# Patient Record
Sex: Female | Born: 1965 | Race: Black or African American | Hispanic: No | Smoking: Never smoker
Health system: Southern US, Community
[De-identification: ages and names within clinical notes are randomized; demographics above are authoritative.]

## PROBLEM LIST (undated history)

## (undated) DIAGNOSIS — I10 Essential (primary) hypertension: Secondary | ICD-10-CM

## (undated) DIAGNOSIS — L03317 Cellulitis of buttock: Secondary | ICD-10-CM

## (undated) DIAGNOSIS — R42 Dizziness and giddiness: Secondary | ICD-10-CM

## (undated) DIAGNOSIS — D649 Anemia, unspecified: Secondary | ICD-10-CM

## (undated) DIAGNOSIS — J302 Other seasonal allergic rhinitis: Secondary | ICD-10-CM

## (undated) HISTORY — PX: ABCESS DRAINAGE: SHX399

## (undated) HISTORY — DX: Essential (primary) hypertension: I10

## (undated) HISTORY — DX: Cellulitis of buttock: L03.317

## (undated) HISTORY — DX: Other seasonal allergic rhinitis: J30.2

## (undated) HISTORY — PX: TUBAL LIGATION: SHX77

## (undated) HISTORY — DX: Anemia, unspecified: D64.9

## (undated) HISTORY — PX: CHOLECYSTECTOMY: SHX55

## (undated) HISTORY — DX: Dizziness and giddiness: R42

---

## 2001-04-15 ENCOUNTER — Observation Stay (HOSPITAL_COMMUNITY): Admission: RE | Admit: 2001-04-15 | Discharge: 2001-04-16 | Payer: Self-pay | Admitting: Surgery

## 2001-04-15 ENCOUNTER — Encounter (INDEPENDENT_AMBULATORY_CARE_PROVIDER_SITE_OTHER): Payer: Self-pay | Admitting: Specialist

## 2001-05-14 ENCOUNTER — Other Ambulatory Visit: Admission: RE | Admit: 2001-05-14 | Discharge: 2001-05-14 | Payer: Self-pay | Admitting: Obstetrics and Gynecology

## 2002-06-23 ENCOUNTER — Other Ambulatory Visit: Admission: RE | Admit: 2002-06-23 | Discharge: 2002-06-23 | Payer: Self-pay | Admitting: Obstetrics and Gynecology

## 2003-07-05 ENCOUNTER — Encounter: Admission: RE | Admit: 2003-07-05 | Discharge: 2003-07-05 | Payer: Self-pay | Admitting: Family Medicine

## 2003-07-06 ENCOUNTER — Other Ambulatory Visit: Admission: RE | Admit: 2003-07-06 | Discharge: 2003-07-06 | Payer: Self-pay | Admitting: Obstetrics and Gynecology

## 2004-07-08 ENCOUNTER — Other Ambulatory Visit: Admission: RE | Admit: 2004-07-08 | Discharge: 2004-07-08 | Payer: Self-pay | Admitting: Obstetrics and Gynecology

## 2005-07-08 ENCOUNTER — Other Ambulatory Visit: Admission: RE | Admit: 2005-07-08 | Discharge: 2005-07-08 | Payer: Self-pay | Admitting: Obstetrics and Gynecology

## 2010-01-03 ENCOUNTER — Ambulatory Visit (HOSPITAL_COMMUNITY): Admission: RE | Admit: 2010-01-03 | Discharge: 2010-01-03 | Payer: Self-pay | Admitting: Obstetrics and Gynecology

## 2010-05-29 LAB — PREGNANCY, URINE: Preg Test, Ur: NEGATIVE

## 2010-05-30 LAB — CBC
HCT: 40.3 % (ref 36.0–46.0)
MCHC: 33.8 g/dL (ref 30.0–36.0)
MCV: 89.9 fL (ref 78.0–100.0)
Platelets: 242 10*3/uL (ref 150–400)
RDW: 13 % (ref 11.5–15.5)

## 2010-08-02 NOTE — Op Note (Signed)
[facility]  Patient:    Michaela Wilson, Michaela Wilson Visit Number: [id number] MRN: [id number]          Service Type: SUR Location: [id number] Attending Physician:  Shelly Rubenstein Dictated by:   Abigail Miyamoto, M.D. Proc. Date: 04/15/01 Admit Date:  04/15/2001                             Operative Report  PREOPERATIVE DIAGNOSIS:  Symptomatic cholelithiasis.  POSTOPERATIVE DIAGNOSIS:  Symptomatic cholelithiasis.  PROCEDURE:  Laparoscopic cholecystectomy.  SURGEON:  Abigail Miyamoto, M.D.  ASSISTANT:  Sheppard Plumber. Earlene Plater, M.D.  ANESTHESIA:  General endotracheal anesthesia and 0.25% Marcaine plain.  ESTIMATED BLOOD LOSS:  Minimal.  FINDINGS:  The patient was found to have cholelithiasis and chronic changes consistent with chronic cholecystitis.  DESCRIPTION OF PROCEDURE:  The patient was brought to the operating room, identified as [name]. She was placed supine on the operating room table and general anesthesia was induced. Her abdomen was then prepped and draped in the usual sterile fashion. Using a #15 blade, a small transverse incision was made below the umbilicus. The incision was carried down to the fascia which was then opened with the scalpel. A hemostat was then used to pass through the peritoneal cavity. A #0 Vicryl pursestring suture was then placed around the fascial opening. The Hasson port was then placed through the opening and insufflation of the abdomen was begun. Next an 11 mm port was placed in the patients epigastrium and two 5 mm ports were placed in the patients right flank under direct vision. The gallbladder was then grasped and retracted above the liver bed. It was found to be chronically inflamed with multiple adhesions to it. These were taken down bluntly. The base of the gallbladder was then dissected out and the cystic stump and artery were each dissected out and placed proximally, once distally and transected with  scissors. A separate bridging vein also required several clips. The gallbladder was then dissected free from the liver bed with the electrocautery. During dissection the gallbladder was slightly entered. Once the gallbladder was completely excised from the liver bed, it was placed in an endosac and removed through the umbilicus. The #0 Vicryl at the umbilicus was then tied in place and closing the fascial defect. A separate figure-of-eight #0 Vicryl suture was placed at the umbilicus as well. The abdomen again was copiously irrigated with normal saline. Hemostasis appeared to be achieved. All ports were then removed under direct vision and the abdomen was deflated. All incisions were then anesthetized with 0.25% Marcaine and then closed with 4-0 monocryl subcuticular sutures. Steri-Strips, gauze and tape were then applied. The patient tolerated the procedure well. All sponge, needle and instrument counts were correct at the end of the procedure. The patient was then extubated in the operating room and taken stable condition to the recovery room. Dictated by:   Abigail Miyamoto, M.D. Attending Physician:  Shelly Rubenstein DD:  04/15/01 TD:  04/15/01 Job: [id number]

## 2014-02-10 ENCOUNTER — Ambulatory Visit (INDEPENDENT_AMBULATORY_CARE_PROVIDER_SITE_OTHER): Payer: BC Managed Care – PPO | Admitting: Family Medicine

## 2014-02-10 VITALS — BP 124/80 | HR 85 | Temp 98.5°F | Resp 18 | Ht 62.0 in | Wt 142.8 lb

## 2014-02-10 DIAGNOSIS — N898 Other specified noninflammatory disorders of vagina: Secondary | ICD-10-CM

## 2014-02-10 DIAGNOSIS — A499 Bacterial infection, unspecified: Secondary | ICD-10-CM

## 2014-02-10 DIAGNOSIS — B9689 Other specified bacterial agents as the cause of diseases classified elsewhere: Secondary | ICD-10-CM

## 2014-02-10 DIAGNOSIS — N760 Acute vaginitis: Secondary | ICD-10-CM

## 2014-02-10 LAB — POCT URINALYSIS DIPSTICK
BILIRUBIN UA: NEGATIVE
Glucose, UA: NEGATIVE
KETONES UA: NEGATIVE
Nitrite, UA: NEGATIVE
PH UA: 5
PROTEIN UA: NEGATIVE
SPEC GRAV UA: 1.025
Urobilinogen, UA: 0.2

## 2014-02-10 LAB — POCT WET PREP WITH KOH
KOH Prep POC: NEGATIVE
TRICHOMONAS UA: NEGATIVE
YEAST WET PREP PER HPF POC: NEGATIVE

## 2014-02-10 LAB — POCT UA - MICROSCOPIC ONLY
CASTS, UR, LPF, POC: NEGATIVE
Crystals, Ur, HPF, POC: NEGATIVE
MUCUS UA: NEGATIVE
YEAST UA: NEGATIVE

## 2014-02-10 MED ORDER — FLUCONAZOLE 150 MG PO TABS: 150.0000 mg | ORAL_TABLET | Freq: Once | ORAL | Status: DC

## 2014-02-10 MED ORDER — METRONIDAZOLE 500 MG PO TABS: 500.0000 mg | ORAL_TABLET | Freq: Two times a day (BID) | ORAL | Status: DC

## 2014-02-10 NOTE — Progress Notes (Signed)
Subjective:  This chart was scribed for Michaela Cheadle, MD by Michaela Wilson, ED Scribe at [facility].The patient was seen in exam room 09 and the patient's care was started at 10:09 AM.   Patient ID: Michaela Wilson, female    DOB: 04-May-1965, 48 y.o.   MRN: [id number] Chief Complaint  Patient presents with  . Vaginitis    Pt. states her vaginal area is swollen and itchy. Pt. used Monistat on Monday with no relief.    HPI  HPI Comments: Michaela Wilson is a 48 y.o. female who presents to [facility] complaining of vaginitis which began 4 days ago. She has, itching and discomfort and a thick, white vaginal discharge as associated symptoms. Pt has tried monistat which has provided little relief. She denies fever, chills, nausea, vomiting, diarrhea, constipation, abdominal pain, and back pain.  Pt oes not recall her last PAP smear, but does believe it has been in the past 5 years. She Has not had an abnormal PAP smear.  Pt has had a tubal ligation.  She does not have a PCP.   History reviewed. No pertinent past medical history. No current outpatient prescriptions on file prior to visit.   No current facility-administered medications on file prior to visit.  No Known Allergies  Review of Systems  Constitutional: Negative for fever and chills.  Gastrointestinal: Negative for nausea, vomiting, abdominal pain, diarrhea and constipation.  Genitourinary: Positive for vaginal discharge and vaginal pain.  Musculoskeletal: Negative for back pain.      Objective:  BP 124/80 mmHg  Pulse 85  Temp(Src) 98.5 F (36.9 C) (Oral)  Resp 18  Ht 5\' 2"  (1.575 m)  Wt 142 lb 12.8 oz (64.774 kg)  BMI 26.11 kg/m2  SpO2 100%  LMP  Physical Exam  Constitutional: She is oriented to person, place, and time. She appears well-developed and well-nourished. No distress.  HENT:  Head: Normocephalic and atraumatic.  Eyes: EOM are normal.  Neck: Normal range of motion.  Cardiovascular: Normal rate.    Pulmonary/Chest: Effort normal.  Genitourinary:  Copious amount of yellow, thick discharge of the labia minora.  Copious amount of thin yellow discharge in vaginal vault Cervix with nabothian cysts very friable  No cervical motion tenderness but uterus is TTP. Uterus is not fixed or enlarged. Adnexais normal bilaterally.  Neurological: She is alert and oriented to person, place, and time.  Skin: Skin is warm and dry.  Psychiatric: She has a normal mood and affect. Her behavior is normal.  Nursing note and vitals reviewed.  Results for orders placed or performed in visit on 02/10/14  POCT Wet Prep with KOH  Result Value Ref Range   Trichomonas, UA Negative    Clue Cells Wet Prep HPF POC 100%    Epithelial Wet Prep HPF POC 5-7    Yeast Wet Prep HPF POC neg    Bacteria Wet Prep HPF POC 3+    RBC Wet Prep HPF POC 10-15    WBC Wet Prep HPF POC 3-5    KOH Prep POC Negative   POCT urinalysis dipstick  Result Value Ref Range   Color, UA yellow    Clarity, UA cloudy    Glucose, UA neg    Bilirubin, UA neg    Ketones, UA neg    Spec Grav, UA 1.025    Blood, UA small    pH, UA 5.0    Protein, UA neg    Urobilinogen, UA 0.2  Nitrite, UA neg    Leukocytes, UA large (3+)   POCT UA - Microscopic Only  Result Value Ref Range   WBC, Ur, HPF, POC 3-5    RBC, urine, microscopic 5-7    Bacteria, U Microscopic 1+    Mucus, UA neg    Epithelial cells, urine per micros 0-1    Crystals, Ur, HPF, POC neg    Casts, Ur, LPF, POC neg    Yeast, UA neg       Assessment & Plan:   Vaginal discharge - Plan: Pap IG, CT/NG NAA, and HPV (high risk), POCT Wet Prep with KOH, POCT urinalysis dipstick, POCT UA - Microscopic Only  Bacterial vaginosis  Meds ordered this encounter  Medications  . metroNIDAZOLE (FLAGYL) 500 MG tablet    Sig: Take 1 tablet (500 mg total) by mouth 2 (two) times daily.    Dispense:  14 tablet    Refill:  0  . fluconazole (DIFLUCAN) 150 MG tablet    Sig: Take 1  tablet (150 mg total) by mouth once.    Dispense:  1 tablet    Refill:  1    I personally performed the services described in this documentation, which was scribed in my presence. The recorded information has been reviewed and considered, and addended by me as needed.  Michaela Cheadle, MD MPH

## 2014-02-10 NOTE — Patient Instructions (Signed)
Bacterial Vaginosis Bacterial vaginosis is a vaginal infection that occurs when the normal balance of bacteria in the vagina is disrupted. It results from an overgrowth of certain bacteria. This is the most common vaginal infection in women of childbearing age. Treatment is important to prevent complications, especially in pregnant women, as it can cause a premature delivery. CAUSES  Bacterial vaginosis is caused by an increase in harmful bacteria that are normally present in smaller amounts in the vagina. Several different kinds of bacteria can cause bacterial vaginosis. However, the reason that the condition develops is not fully understood. RISK FACTORS Certain activities or behaviors can put you at an increased risk of developing bacterial vaginosis, including:  Having a new sex partner or multiple sex partners.  Douching.  Using an intrauterine device (IUD) for contraception. Women do not get bacterial vaginosis from toilet seats, bedding, swimming pools, or contact with objects around them. SIGNS AND SYMPTOMS  Some women with bacterial vaginosis have no signs or symptoms. Common symptoms include:  Grey vaginal discharge.  A fishlike odor with discharge, especially after sexual intercourse.  Itching or burning of the vagina and vulva.  Burning or pain with urination. DIAGNOSIS  Your health care provider will take a medical history and examine the vagina for signs of bacterial vaginosis. A sample of vaginal fluid may be taken. Your health care provider will look at this sample under a microscope to check for bacteria and abnormal cells. A vaginal pH test may also be done.  TREATMENT  Bacterial vaginosis may be treated with antibiotic medicines. These may be given in the form of a pill or a vaginal cream. A second round of antibiotics may be prescribed if the condition comes back after treatment.  HOME CARE INSTRUCTIONS   Only take over-the-counter or prescription medicines as  directed by your health care provider.  If antibiotic medicine was prescribed, take it as directed. Make sure you finish it even if you start to feel better.  Do not have sex until treatment is completed.  Tell all sexual partners that you have a vaginal infection. They should see their health care provider and be treated if they have problems, such as a mild rash or itching.  Practice safe sex by using condoms and only having one sex partner. SEEK MEDICAL CARE IF:   Your symptoms are not improving after 3 days of treatment.  You have increased discharge or pain.  You have a fever. MAKE SURE YOU:   Understand these instructions.  Will watch your condition.  Will get help right away if you are not doing well or get worse. FOR MORE INFORMATION  Centers for Disease Control and Prevention, Division of STD Prevention: www.cdc.gov/std American Sexual Health Association (ASHA): www.ashastd.org  Document Released: 03/03/2005 Document Revised: 12/22/2012 Document Reviewed: 10/13/2012 ExitCare Patient Information 2015 [employer]. This information is not intended to replace advice given to you by your health care provider. Make sure you discuss any questions you have with your health care provider.  

## 2014-02-15 LAB — PAP IG, CT-NG NAA, HPV HIGH-RISK
Chlamydia Probe Amp: NEGATIVE
GC Probe Amp: NEGATIVE
HPV DNA HIGH RISK: NOT DETECTED

## 2014-02-16 NOTE — Progress Notes (Signed)
Left message for patient to call back to schedule a physical with Dr Brigitte Pulse.

## 2014-02-20 NOTE — Progress Notes (Signed)
LMVM to CB to schedule CPE with Dr. Brigitte Pulse.

## 2014-04-11 ENCOUNTER — Encounter: Payer: Self-pay | Admitting: Family Medicine

## 2015-03-07 ENCOUNTER — Encounter (HOSPITAL_COMMUNITY): Payer: Self-pay | Admitting: *Deleted

## 2015-03-07 ENCOUNTER — Inpatient Hospital Stay (HOSPITAL_COMMUNITY)
Admission: EM | Admit: 2015-03-07 | Discharge: 2015-03-10 | DRG: 872 | Disposition: A | Discharge status: Home Health | Payer: 59 | Attending: Internal Medicine | Admitting: Internal Medicine

## 2015-03-07 DIAGNOSIS — A419 Sepsis, unspecified organism: Secondary | ICD-10-CM | POA: Diagnosis not present

## 2015-03-07 DIAGNOSIS — L0231 Cutaneous abscess of buttock: Secondary | ICD-10-CM | POA: Diagnosis present

## 2015-03-07 DIAGNOSIS — L03317 Cellulitis of buttock: Secondary | ICD-10-CM | POA: Diagnosis not present

## 2015-03-07 DIAGNOSIS — R509 Fever, unspecified: Secondary | ICD-10-CM | POA: Diagnosis present

## 2015-03-07 DIAGNOSIS — Z8249 Family history of ischemic heart disease and other diseases of the circulatory system: Secondary | ICD-10-CM

## 2015-03-07 DIAGNOSIS — D72829 Elevated white blood cell count, unspecified: Secondary | ICD-10-CM | POA: Diagnosis not present

## 2015-03-07 HISTORY — DX: Cellulitis of buttock: L03.317

## 2015-03-07 LAB — COMPREHENSIVE METABOLIC PANEL
ALT: 37 U/L (ref 14–54)
ANION GAP: 9 (ref 5–15)
AST: 33 U/L (ref 15–41)
Albumin: 3.4 g/dL — ABNORMAL LOW (ref 3.5–5.0)
Alkaline Phosphatase: 80 U/L (ref 38–126)
BUN: 8 mg/dL (ref 6–20)
CALCIUM: 8.8 mg/dL — AB (ref 8.9–10.3)
CO2: 27 mmol/L (ref 22–32)
Chloride: 100 mmol/L — ABNORMAL LOW (ref 101–111)
Creatinine, Ser: 0.91 mg/dL (ref 0.44–1.00)
GLUCOSE: 111 mg/dL — AB (ref 65–99)
POTASSIUM: 3.9 mmol/L (ref 3.5–5.1)
SODIUM: 136 mmol/L (ref 135–145)
TOTAL PROTEIN: 7.6 g/dL (ref 6.5–8.1)
Total Bilirubin: 1.3 mg/dL — ABNORMAL HIGH (ref 0.3–1.2)

## 2015-03-07 LAB — CBC WITH DIFFERENTIAL/PLATELET
BASOS ABS: 0 10*3/uL (ref 0.0–0.1)
BASOS PCT: 0 %
Eosinophils Absolute: 0 10*3/uL (ref 0.0–0.7)
Eosinophils Relative: 0 %
HEMATOCRIT: 36.5 % (ref 36.0–46.0)
HEMOGLOBIN: 11.5 g/dL — AB (ref 12.0–15.0)
LYMPHS PCT: 12 %
Lymphs Abs: 1.7 10*3/uL (ref 0.7–4.0)
MCH: 27.8 pg (ref 26.0–34.0)
MCHC: 31.5 g/dL (ref 30.0–36.0)
MCV: 88.4 fL (ref 78.0–100.0)
MONO ABS: 1.7 10*3/uL — AB (ref 0.1–1.0)
Monocytes Relative: 12 %
NEUTROS ABS: 10.9 10*3/uL — AB (ref 1.7–7.7)
NEUTROS PCT: 76 %
Platelets: 237 10*3/uL (ref 150–400)
RBC: 4.13 MIL/uL (ref 3.87–5.11)
RDW: 12.6 % (ref 11.5–15.5)
WBC: 14.3 10*3/uL — AB (ref 4.0–10.5)

## 2015-03-07 LAB — PROCALCITONIN: Procalcitonin: <0.1 ng/mL

## 2015-03-07 LAB — LACTIC ACID, PLASMA
LACTIC ACID, VENOUS: 1.4 mmol/L (ref 0.5–2.0)
Lactic Acid, Venous: 1 mmol/L (ref 0.5–2.0)

## 2015-03-07 LAB — I-STAT CG4 LACTIC ACID, ED: Lactic Acid, Venous: 1.05 mmol/L (ref 0.5–2.0)

## 2015-03-07 MED ORDER — VANCOMYCIN HCL IN DEXTROSE 750-5 MG/150ML-% IV SOLN
750.0000 mg | Freq: Two times a day (BID) | INTRAVENOUS | Status: DC
  Administered 2015-03-08 – 2015-03-10 (×5): 750 mg via INTRAVENOUS
  Filled 2015-03-07 (×6): qty 150

## 2015-03-07 MED ORDER — SODIUM CHLORIDE 0.9 % IV BOLUS (SEPSIS)
1000.0000 mL | Freq: Once | INTRAVENOUS | Status: AC
  Administered 2015-03-07: 1000 mL via INTRAVENOUS

## 2015-03-07 MED ORDER — ACETAMINOPHEN 650 MG RE SUPP: 650.0000 mg | Freq: Four times a day (QID) | RECTAL | Status: DC | PRN

## 2015-03-07 MED ORDER — HYDROCODONE-ACETAMINOPHEN 5-325 MG PO TABS
1.0000 | ORAL_TABLET | ORAL | Status: DC | PRN
  Administered 2015-03-07 – 2015-03-08 (×2): 1 via ORAL
  Administered 2015-03-08: 2 via ORAL
  Administered 2015-03-09: 1 via ORAL
  Filled 2015-03-07 (×2): qty 1
  Filled 2015-03-07: qty 2
  Filled 2015-03-07: qty 1

## 2015-03-07 MED ORDER — VANCOMYCIN HCL IN DEXTROSE 1-5 GM/200ML-% IV SOLN
1000.0000 mg | Freq: Once | INTRAVENOUS | Status: AC
  Administered 2015-03-07: 1000 mg via INTRAVENOUS
  Filled 2015-03-07: qty 200

## 2015-03-07 MED ORDER — SODIUM CHLORIDE 0.9 % IJ SOLN
3.0000 mL | Freq: Two times a day (BID) | INTRAMUSCULAR | Status: DC
  Administered 2015-03-09: 3 mL via INTRAVENOUS

## 2015-03-07 MED ORDER — SODIUM CHLORIDE 0.9 % IV SOLN: INTRAVENOUS | Status: DC

## 2015-03-07 MED ORDER — SODIUM CHLORIDE 0.9 % IV BOLUS (SEPSIS): 1000.0000 mL | Freq: Once | INTRAVENOUS | Status: DC

## 2015-03-07 MED ORDER — SODIUM CHLORIDE 0.9 % IV SOLN
INTRAVENOUS | Status: AC
  Administered 2015-03-07: 15:00:00 via INTRAVENOUS

## 2015-03-07 MED ORDER — ACETAMINOPHEN 325 MG PO TABS: 650.0000 mg | ORAL_TABLET | Freq: Four times a day (QID) | ORAL | Status: DC | PRN

## 2015-03-07 MED ORDER — ACETAMINOPHEN 500 MG PO TABS
1000.0000 mg | ORAL_TABLET | Freq: Once | ORAL | Status: AC
  Administered 2015-03-07: 1000 mg via ORAL
  Filled 2015-03-07: qty 2

## 2015-03-07 MED ORDER — PIPERACILLIN-TAZOBACTAM 3.375 G IVPB
3.3750 g | Freq: Three times a day (TID) | INTRAVENOUS | Status: DC
  Administered 2015-03-07 – 2015-03-10 (×8): 3.375 g via INTRAVENOUS
  Filled 2015-03-07 (×5): qty 50

## 2015-03-07 MED ORDER — ONDANSETRON HCL 4 MG PO TABS: 4.0000 mg | ORAL_TABLET | Freq: Four times a day (QID) | ORAL | Status: DC | PRN

## 2015-03-07 MED ORDER — DEXTROSE 5 % IV SOLN
1.0000 g | Freq: Once | INTRAVENOUS | Status: AC
  Administered 2015-03-07: 1 g via INTRAVENOUS
  Filled 2015-03-07: qty 10

## 2015-03-07 MED ORDER — ONDANSETRON HCL 4 MG/2ML IJ SOLN: 4.0000 mg | Freq: Four times a day (QID) | INTRAMUSCULAR | Status: DC | PRN

## 2015-03-07 MED ORDER — ONDANSETRON HCL 4 MG/2ML IJ SOLN: 4.0000 mg | Freq: Three times a day (TID) | INTRAMUSCULAR | Status: DC | PRN

## 2015-03-07 MED ORDER — LIDOCAINE-EPINEPHRINE 2 %-1:100000 IJ SOLN
20.0000 mL | Freq: Once | INTRAMUSCULAR | Status: AC
  Administered 2015-03-07: 20 mL via INTRADERMAL
  Filled 2015-03-07: qty 1

## 2015-03-07 MED ORDER — PIPERACILLIN-TAZOBACTAM 3.375 G IVPB 30 MIN
3.3750 g | Freq: Once | INTRAVENOUS | Status: AC
  Administered 2015-03-07: 3.375 g via INTRAVENOUS
  Filled 2015-03-07 (×2): qty 50

## 2015-03-07 NOTE — ED Notes (Addendum)
Pt reports she got a cyst on left buttocks starting Sunday 12/18, pt has quarter size abscess area, but hand size redness and swelling around. Hot to touch. Pt reports dizziness started yesterday with dry mouth. Pain 1/10. Pt reports she has a high pain tolerance, pt came into ED due to dizziness.   Wound started draining yesterday.

## 2015-03-07 NOTE — Progress Notes (Addendum)
Pt given a list of [facility] network internal medicine within zip code [phone] Pt appreciative of services provided \ Entered in d/c instructions Please use the list of in network providers for [facility] Schedule an appointment as soon as possible for a visit on 03/12/2015 given to you by the [facility] ED case manager to assist with finding a doctor for follow up care

## 2015-03-07 NOTE — H&P (Addendum)
[facility] History and Physical  Michaela Wilson QQP:[id number] DOB: [birth year] DOA: 03/07/2015  Referring physician: ER physician: Dr. Noemi Chapel  PCP: Dr. Delman Cheadle  Chief Complaint: right buttock abscess  HPI:  49 year old female with no significant past medical history who presented to [facility] ED with worsening pain, swelling and erythema on the left buttock over past few days prior to this admission. It initially started as some pain which then progressed to more swelling, itching and then she noticed pus drainage in the area.. It was so painful per pt that she nearly passed out. Pain was 10/10 in intensity. She took ibuprofen but this has not provided significant symptomatic relief. The pain has improved with analgesia given in ED. Pt had associated fevers and chills at home. No abdominal pain, nausea or vomiting. No chest pain, shortness of breath or palpitations. No loss of consciousness. No diarrhea or constipation. No blood ins tool or urine.   In ED, T max was 103.1 F, HR 121, RR 24. WBC count was 14.3. Abscess in left buttock area was drained in ED. Shew as admitted for sepsis due to abscess and cellulitis and started on broad spectrum abx, vanco and zosyn.   Assessment & Plan    Principal Problem:   Sepsis ([place]) /  Cellulitis and abscess of left buttock /  Leukocytosis - Sepsis criteria met on admission with fever, tachycardia, tachypnea, leukocytosis. Lactic acid was WNL. - Left buttock cellulitis with abscess presumed source of infection - Sepsis work up initiated - Started on vanco and zosyn  - Blood cultures pending - Procalcitonin level is pending  - Continue pain management efforts   DVT prophylaxis:  - SCD's bilaterally   Radiological Exams on Admission: No results found.   Code Status: Full Family Communication: Plan of care discussed with the patient  Disposition Plan: Admit for further evaluation, telemetry admission   Leisa Lenz, MD  [facility] Pager [phone]  Time spent in minutes: 75 minutes  Review of Systems:  Constitutional: Negative for fever, chills and malaise/fatigue. Negative for diaphoresis.  HENT: Negative for hearing loss, ear pain, nosebleeds, congestion, sore throat, neck pain, tinnitus and ear discharge.   Eyes: Negative for blurred vision, double vision, photophobia, pain, discharge and redness.  Respiratory: Negative for cough, hemoptysis, sputum production, shortness of breath, wheezing and stridor.   Cardiovascular: Negative for chest pain, palpitations, orthopnea, claudication and leg swelling.  Gastrointestinal: Negative for nausea, vomiting and abdominal pain. Negative for heartburn, constipation, blood in stool and melena.  Genitourinary: Negative for dysuria, urgency, frequency, hematuria and flank pain.  Musculoskeletal: Negative for myalgias, back pain, joint pain and falls.  Skin: per HPI Neurological: Negative for dizziness and weakness. Negative for tingling, tremors, sensory change, speech change, focal weakness, loss of consciousness and headaches.  Endo/Heme/Allergies: Negative for environmental allergies and polydipsia. Does not bruise/bleed easily.  Psychiatric/Behavioral: Negative for suicidal ideas. The patient is not nervous/anxious.      History reviewed. No pertinent past medical history. Past Surgical History  Procedure Laterality Date  . Tubal ligation     Social History:  reports that she has never smoked. She does not have any smokeless tobacco history on file. She reports that she drinks alcohol. She reports that she does not use illicit drugs.  No Known Allergies  Family History: hypertension in mother    Prior to Admission medications   Medication Sig Start Date End Date Taking? Authorizing Provider  ibuprofen (ADVIL,MOTRIN) 200 MG tablet Take 400-800  mg by mouth every 6 (six) hours as needed for moderate pain.   Yes Historical Provider, MD   Physical  Exam: Filed Vitals:   03/07/15 1112 03/07/15 1117  BP: 133/89   Pulse: 121 120  Temp: 103.1 F (39.5 C)   TempSrc: Oral   Resp: 20 24  SpO2: 99% 94%    Physical Exam  Constitutional: Appears well-developed and well-nourished. No distress.  HENT: Normocephalic. No tonsillar erythema or exudates Eyes: Conjunctivae are normal. No scleral icterus.  Neck: Normal ROM. Neck supple. No JVD. No tracheal deviation. No thyromegaly.  CVS: RRR, S1/S2 +, no murmurs, no gallops, no carotid bruit.  Pulmonary: Effort and breath sounds normal, no stridor, rhonchi, wheezes, rales.  Abdominal: Soft. BS +,  no distension, tenderness, rebound or guarding.  Musculoskeletal: Normal range of motion. No edema and no tenderness.  Lymphadenopathy: No lymphadenopathy noted, cervical, inguinal. Neuro: Alert. Normal reflexes, muscle tone coordination. No focal neurologic deficits. Skin: left buttock abscess drained but has erythema, pain in the area, no rash  Psychiatric: Normal mood and affect. Behavior, judgment, thought content normal.   Labs on Admission:  Basic Metabolic Panel:  Recent Labs Lab 03/07/15 1202  NA 136  K 3.9  CL 100*  CO2 27  GLUCOSE 111*  BUN 8  CREATININE 0.91  CALCIUM 8.8*   Liver Function Tests:  Recent Labs Lab 03/07/15 1202  AST 33  ALT 37  ALKPHOS 80  BILITOT 1.3*  PROT 7.6  ALBUMIN 3.4*   No results for input(s): LIPASE, AMYLASE in the last 168 hours. No results for input(s): AMMONIA in the last 168 hours. CBC:  Recent Labs Lab 03/07/15 1202  WBC 14.3*  NEUTROABS 10.9*  HGB 11.5*  HCT 36.5  MCV 88.4  PLT 237   Cardiac Enzymes: No results for input(s): CKTOTAL, CKMB, CKMBINDEX, TROPONINI in the last 168 hours. BNP: Invalid input(s): POCBNP CBG: No results for input(s): GLUCAP in the last 168 hours.  If 7PM-7AM, please contact night-coverage [email or web address] Password [facility] 03/07/2015, 12:51 PM

## 2015-03-07 NOTE — ED Provider Notes (Addendum)
CSN: [id number]     Arrival date & time 03/07/15  1053 History   First MD Initiated Contact with Patient 03/07/15 1126     Chief Complaint  Patient presents with  . Abscess  . Possible Sepsis      (Consider location/radiation/quality/duration/timing/severity/associated sxs/prior Treatment) HPI  The patient is a 49 year old female, she presents with multiple complaints including dizziness and an abscess on her buttock.  Recently as of several days ago she developed a small amount of pain and swelling on her left buttock, this came after scratching intensely in that area. She has had a gradual and progressive swelling which yesterday started to drain a purulent material. Today however she developed severe lightheadedness and was near syncopal multiple times while trying to get dressed for the day. The symptoms are persistent, gradually worsening, they are now severe. There is no vomiting but she is very nauseated, no coughing, shortness of breath, abdominal pain, back pain, dysuria or diarrhea.  History reviewed. No pertinent past medical history. Past Surgical History  Procedure Laterality Date  . Tubal ligation     History reviewed. No pertinent family history. Social History  Substance Use Topics  . Smoking status: Never Smoker   . Smokeless tobacco: None  . Alcohol Use: 0.0 oz/week    0 Standard drinks or equivalent per week     Comment: socially   OB History    No data available     Review of Systems  All other systems reviewed and are negative.     Allergies  Review of patient's allergies indicates no known allergies.  Home Medications   Prior to Admission medications   Medication Sig Start Date End Date Taking? Authorizing Provider  ibuprofen (ADVIL,MOTRIN) 200 MG tablet Take 400-800 mg by mouth every 6 (six) hours as needed for moderate pain.   Yes Historical Provider, MD   BP 133/89 mmHg  Pulse 120  Temp(Src) 103.1 F (39.5 C) (Oral)  Resp 24  SpO2  94% Physical Exam  Constitutional: She appears well-developed and well-nourished. No distress.  HENT:  Head: Normocephalic and atraumatic.  Mouth/Throat: Oropharynx is clear and moist. No oropharyngeal exudate.  Eyes: Conjunctivae and EOM are normal. Pupils are equal, round, and reactive to light. Right eye exhibits no discharge. Left eye exhibits no discharge. No scleral icterus.  Neck: Normal range of motion. Neck supple. No JVD present. No thyromegaly present.  Cardiovascular: Regular rhythm, normal heart sounds and intact distal pulses.  Exam reveals no gallop and no friction rub.   No murmur heard. tachycardia  Pulmonary/Chest: Effort normal and breath sounds normal. No respiratory distress. She has no wheezes. She has no rales.  Abdominal: Soft. Bowel sounds are normal. She exhibits no distension and no mass. There is no tenderness.  Musculoskeletal: Normal range of motion. She exhibits no edema or tenderness.  Lymphadenopathy:    She has no cervical adenopathy.  Neurological: She is alert. Coordination normal.  Skin: Skin is warm and dry. Rash noted. There is erythema.  Fluctuant and indurated L buttock with diffuse swelling and erythema - no perirectal swellihg / purulence  Psychiatric: She has a normal mood and affect. Her behavior is normal.  Nursing note and vitals reviewed.   ED Course  Procedures (including critical care time) Labs Review Labs Reviewed  COMPREHENSIVE METABOLIC PANEL - Abnormal; Notable for the following:    Chloride 100 (*)    Glucose, Bld 111 (*)    Calcium 8.8 (*)    Albumin 3.4 (*)  Total Bilirubin 1.3 (*)    All other components within normal limits  CBC WITH DIFFERENTIAL/PLATELET - Abnormal; Notable for the following:    WBC 14.3 (*)    Hemoglobin 11.5 (*)    Neutro Abs 10.9 (*)    Monocytes Absolute 1.7 (*)    All other components within normal limits  CULTURE, BLOOD (ROUTINE X 2)  CULTURE, BLOOD (ROUTINE X 2)  URINALYSIS, ROUTINE W  REFLEX MICROSCOPIC (NOT AT [facility])  I-STAT CG4 LACTIC ACID, ED    Imaging Review No results found. I have personally reviewed and evaluated these images and lab results as part of my medical decision-making.  MDM   Final diagnoses:  Sepsis, due to unspecified organism ([place])  Abscess of left buttock    The pt appears septic with fever > 103, tachycardia and source  Of infection of the soft tissue.  Despite abscess showing on ultrasound as 2 discrete small areas there was actually a large sinus tract and acting these 2 areas that required significant amount of loculation disruption. This was done with blunt dissection with my hemostats, it was irrigated copiously and a Penrose drain was left in place. The patient does meet sepsis criteria, she does have a leukocytosis, she will be admitted overnight for IV antibiotics.  D/w Dr. Charlies Silvers who will admit Holding orders written at request   INCISION AND DRAINAGE Performed by: Noemi Chapel D Consent: Verbal consent obtained. Risks and benefits: risks, benefits and alternatives were discussed Type: abscess  Body area: L buttock  Anesthesia: local infiltration  Incision was made with a scalpel.  Local anesthetic: lidocaine 1% with epinephrine  Anesthetic total: 12 ml  Complexity: complex Blunt dissection to break up loculations  Drainage: purulent  Drainage amount: large  Packing material: penrose drain  Patient tolerance: Patient tolerated the procedure well with no immediate complications.  Meds given in ED:  Medications  cefTRIAXone (ROCEPHIN) 1 g in dextrose 5 % 50 mL IVPB (1 g Intravenous New Bag/Given 03/07/15 1237)  sodium chloride 0.9 % bolus 1,000 mL (not administered)  lidocaine-EPINEPHrine (XYLOCAINE W/EPI) 2 %-1:100000 (with pres) injection 20 mL (20 mLs Intradermal Given 03/07/15 1158)  sodium chloride 0.9 % bolus 1,000 mL (1,000 mLs Intravenous New Bag/Given 03/07/15 1237)  acetaminophen (TYLENOL) tablet 1,000  mg (1,000 mg Oral Given 03/07/15 1157)   EMERGENCY DEPARTMENT US SOFT TISSUE INTERPRETATION "Study: Limited Ultrasound of the noted body part in comments below"  INDICATIONS: Pain and Soft tissue infection Multiple views of the body part are obtained with a multi-frequency linear probe  PERFORMED BY:  Myself  IMAGES ARCHIVED?: Yes  SIDE:Left  BODY PART:Other soft tisse (comment in note)  FINDINGS: Abcess present  LIMITATIONS:  Body Habitus and Emergent Procedure  INTERPRETATION:  Abcess present  COMMENT:  L buttock - abscess    CRITICAL CARE Performed by: Noemi Chapel D Total critical care time: 35 minutes Critical care time was exclusive of separately billable procedures and treating other patients. Critical care was necessary to treat or prevent imminent or life-threatening deterioration. Critical care was time spent personally by me on the following activities: development of treatment plan with patient and/or surrogate as well as nursing, discussions with consultants, evaluation of patient's response to treatment, examination of patient, obtaining history from patient or surrogate, ordering and performing treatments and interventions, ordering and review of laboratory studies, ordering and review of radiographic studies, pulse oximetry and re-evaluation of patient's condition.    Noemi Chapel, MD 03/07/15 1245  Noemi Chapel, MD 04/07/15  1533 

## 2015-03-07 NOTE — ED Notes (Signed)
Spoke with Wells Guiles NS on 4E. 20 min start 1320. Jana Half RN aware

## 2015-03-07 NOTE — Progress Notes (Signed)
ANTIBIOTIC CONSULT NOTE - INITIAL  Pharmacy Consult for vancomycin/Zosyn Indication: Cellulitis/sepsis  No Known Allergies  Patient Measurements: Height: 5\' 2"  (157.5 cm) Weight: 151 lb (68.493 kg) IBW/kg (Calculated) : 50.1 Adjusted Body Weight:   Vital Signs: Temp: 99.3 F (37.4 C) (12/21 1430) Temp Source: Oral (12/21 1430) BP: 115/66 mmHg (12/21 1430) Pulse Rate: 99 (12/21 1430) Intake/Output from previous day:   Intake/Output from this shift:    Labs:  Recent Labs  03/07/15 1202  WBC 14.3*  HGB 11.5*  PLT 237  CREATININE 0.91   Estimated Creatinine Clearance: 68.6 mL/min (by C-G formula based on Cr of 0.91). No results for input(s): VANCOTROUGH, VANCOPEAK, VANCORANDOM, GENTTROUGH, GENTPEAK, GENTRANDOM, TOBRATROUGH, TOBRAPEAK, TOBRARND, AMIKACINPEAK, AMIKACINTROU, AMIKACIN in the last 72 hours.   Microbiology: No results found for this or any previous visit (from the past 720 hour(s)).  Medical History: History reviewed. No pertinent past medical history.  Medications:  Infusions:  . sodium chloride 75 mL/hr at 03/07/15 1510   PRN: acetaminophen **OR** acetaminophen, HYDROcodone-acetaminophen, ondansetron **OR** ondansetron (ZOFRAN) IV    Assessment: Pt is a 49 yo female diagnosed with cellulitis which worsened after scratching. Pt has a buttock abcess with purulence with I and D performed in ER. Pt also meets sepsis criteria.      12/21 >> vancomycin >> 12/21 >> Zosyn >>    12/21 blood x2 :    Goal of Therapy:  Vancomycin trough level 15-20 mcg/ml  Eradication of disease Follow-up of clinical course  Plan:  Follow up culture results  Zosyn 3.375 gr IV x1 over 30 minutes, then 3.375 gr IV q8h EI. Vancomycin 1000 mg IV x1, then vancomycin 750 mg IV q12h.   Royetta Asal, PharmD, BCPS Pager [phone] 03/07/2015 4:16 PM

## 2015-03-07 NOTE — Progress Notes (Signed)
[facility] ED CM noted pt with coverage but no pcp listed Spoke with pt who confirms no pcp  [facility] ED CM spoke with pt on how to obtain an in network pcp with insurance coverage via the customer service number or web site  Cm reviewed ED level of care for crisis/emergent services and community pcp level of care to manage continuous or chronic medical concerns.  The pt voiced understanding CM encouraged pt and discussed pt's responsibility to verify with pt's insurance carrier that any recommended medical provider offered by any emergency room or a hospital provider is within the carrier's network. The pt voiced understanding      

## 2015-03-08 LAB — BASIC METABOLIC PANEL
Anion gap: 9 (ref 5–15)
BUN: 9 mg/dL (ref 6–20)
CALCIUM: 8.5 mg/dL — AB (ref 8.9–10.3)
CHLORIDE: 103 mmol/L (ref 101–111)
CO2: 26 mmol/L (ref 22–32)
CREATININE: 0.78 mg/dL (ref 0.44–1.00)
GFR calc non Af Amer: >60 mL/min (ref >60)
Glucose, Bld: 113 mg/dL — ABNORMAL HIGH (ref 65–99)
Potassium: 3.5 mmol/L (ref 3.5–5.1)
SODIUM: 138 mmol/L (ref 135–145)

## 2015-03-08 LAB — CBC
HCT: 31.6 % — ABNORMAL LOW (ref 36.0–46.0)
Hemoglobin: 10.4 g/dL — ABNORMAL LOW (ref 12.0–15.0)
MCH: 28.5 pg (ref 26.0–34.0)
MCHC: 32.9 g/dL (ref 30.0–36.0)
MCV: 86.6 fL (ref 78.0–100.0)
PLATELETS: 205 10*3/uL (ref 150–400)
RBC: 3.65 MIL/uL — ABNORMAL LOW (ref 3.87–5.11)
RDW: 12.5 % (ref 11.5–15.5)
WBC: 12.6 10*3/uL — AB (ref 4.0–10.5)

## 2015-03-08 LAB — GLUCOSE, CAPILLARY: GLUCOSE-CAPILLARY: 83 mg/dL (ref 65–99)

## 2015-03-08 NOTE — Progress Notes (Signed)
Report received from I. Glory Rosebush, RN. No change in previous assessment. Stacey Drain

## 2015-03-08 NOTE — Progress Notes (Addendum)
Patient ID: Michaela Wilson, female   DOB: 1965/04/02, 50 y.o.   MRN: [id number] [facility] PROGRESS NOTE  Michaela Wilson [id number] DOB: 04/04/1965 DOA: 03/07/2015 PCP: Dr. Delman Cheadle  Brief narrative:    49 year old female with no significant past medical history who presented to [facility] ED with worsening pain, swelling and erythema on the left buttock over past few days prior to this admission. It initially started as some pain which then progressed to more swelling, itching and then she noticed pus drainage in the area..Pt also had associated fevers at home. It was so painful per pt that she nearly passed out. Ibuprofen has not provided significant symptomatic relief.   In ED, T max was 103.1 F, HR 121, RR 24. WBC count was 14.3. Abscess in left buttock area was drained in ED. Shew as admitted for sepsis due to abscess and cellulitis and started on broad spectrum abx, vanco and zosyn.   Assessment/Plan:    Principal Problem:  Sepsis ([place]) / Cellulitis and abscess of left buttock / Leukocytosis - Sepsis criteria met on admission with fever, tachycardia, tachypnea, leukocytosis. Lactic acid was WNL. Procalcitonin was WNL. Cellulitis in left buttock area is presumed source of infection - On vanco and zosyn - Blood cultures pending    DVT prophylaxis:  - SCD's bilaterally   Code Status: Full.  Family Communication:  plan of care discussed with the patient Disposition Plan: home likely by 03/10/2015.  IV access:  Peripheral IV  Procedures and diagnostic studies:    No results found.  Medical Consultants:  None   Other Consultants:  None   IAnti-Infectives:   Vanco and zosyn 03/07/2015 -->   Leisa Lenz, MD  [facility] Pager [phone]  Time spent in minutes: 25 minutes  If 7PM-7AM, please contact night-coverage [email or web address] Password [facility] 03/08/2015, 11:45 AM   LOS: 1 day    HPI/Subjective: No acute overnight events. Patient reports she feels  little better this am.  Objective: Filed Vitals:   03/07/15 1308 03/07/15 1430 03/07/15 2053 03/08/15 0432  BP: 119/75 115/66 116/61 120/75  Pulse: 110 99 93 91  Temp:  99.3 F (37.4 C) 98.5 F (36.9 C) 99 F (37.2 C)  TempSrc:  Oral Oral Oral  Resp: 16 16 20 18   Height:  5' 2"  (1.575 m)    Weight:  68.493 kg (151 lb)  69.128 kg (152 lb 6.4 oz)  SpO2: 96% 98% 99% 99%    Intake/Output Summary (Last 24 hours) at 03/08/15 1145 Last data filed at 03/07/15 1845  Gross per 24 hour  Intake 268.75 ml  Output      0 ml  Net 268.75 ml    Exam:   General:  Pt is alert, follows commands appropriately, not in acute distress  Cardiovascular: Regular rate and rhythm, S1/S2, no murmurs  Respiratory: Clear to auscultation bilaterally, no wheezing, no crackles, no rhonchi  Abdomen: Soft, non tender, non distended, bowel sounds present  Extremities: No edema, pulses DP and PT palpable bilaterally; dressing in place over left buttock with less swelling and pain  Neuro: Grossly nonfocal  Data Reviewed: Basic Metabolic Panel:  Recent Labs Lab 03/07/15 1202 03/08/15 0335  NA 136 138  K 3.9 3.5  CL 100* 103  CO2 27 26  GLUCOSE 111* 113*  BUN 8 9  CREATININE 0.91 0.78  CALCIUM 8.8* 8.5*   Liver Function Tests:  Recent Labs Lab 03/07/15 1202  AST 33  ALT 37  ALKPHOS 80  BILITOT 1.3*  PROT 7.6  ALBUMIN 3.4*   No results for input(s): LIPASE, AMYLASE in the last 168 hours. No results for input(s): AMMONIA in the last 168 hours. CBC:  Recent Labs Lab 03/07/15 1202 03/08/15 0335  WBC 14.3* 12.6*  NEUTROABS 10.9*  --   HGB 11.5* 10.4*  HCT 36.5 31.6*  MCV 88.4 86.6  PLT 237 205   Cardiac Enzymes: No results for input(s): CKTOTAL, CKMB, CKMBINDEX, TROPONINI in the last 168 hours. BNP: Invalid input(s): POCBNP CBG:  Recent Labs Lab 03/08/15 0737  GLUCAP 83    No results found for this or any previous visit (from the past 240 hour(s)).   Scheduled  Meds: . piperacillin-tazobactam (ZOSYN)  IV  3.375 g Intravenous Q8H  . sodium chloride  3 mL Intravenous Q12H  . vancomycin  750 mg Intravenous Q12H   Continuous Infusions:

## 2015-03-09 LAB — GLUCOSE, CAPILLARY: GLUCOSE-CAPILLARY: 101 mg/dL — AB (ref 65–99)

## 2015-03-09 MED ORDER — SODIUM CHLORIDE 0.9 % IV SOLN
INTRAVENOUS | Status: AC
  Administered 2015-03-09: 11:00:00 via INTRAVENOUS

## 2015-03-09 NOTE — Consult Note (Signed)
WOC wound consult note Reason for Consult: left buttock abscess with Penrose drain placed by ED MD  Wound type: I & D site right buttock  Pressure Ulcer POA: No Measurement:1cm x 1cm  Opening with penrose inserted centrally.  I did not probe depth in order to not dislodge penrose  Wound [id number] Drainage (amount, consistency, odor) serosanguinous  Periwound: intact, induration about 1cm circumferentially at wound.  Dressing procedure/placement/frequency: I contacted ordering MD to notify no other topical care besides dry dressing at this time.  Will need to determine who will manage care of Penrose and when this should be removed.  I have instructed she can manage drainage with large kotex pad and tape change as frequently as needed.  Will need instructions for whether or not she can shower with drain in place or not.   Discussed POC with patient and bedside nurse.  Re consult if needed, will not follow at this time. Thanks  Ieesha Abbasi [place], [place] [phone])

## 2015-03-09 NOTE — Progress Notes (Signed)
Patient ID: Michaela Wilson, female   DOB: [birth year], 49 y.o.   MRN: [id number] [facility] PROGRESS NOTE  GEETA DWORKIN VFI:[id number] DOB: 11/29/1965 DOA: 03/07/2015 PCP: Dr. Delman Cheadle  Brief narrative:    49 year old female with no significant past medical history who presented to [facility] ED with worsening pain, swelling and erythema on the left buttock over past few days prior to this admission. It initially started as some pain which then progressed to more swelling, itching and then she noticed pus drainage in the area..Pt also had associated fevers at home. It was so painful per pt that she nearly passed out. Ibuprofen has not provided significant symptomatic relief.   In ED, T max was 103.1 F, HR 121, RR 24. WBC count was 14.3. Abscess in left buttock area was drained in ED. Shew as admitted for sepsis due to abscess and cellulitis and started on broad spectrum abx, vanco and zosyn.   Assessment/Plan:    Principal Problem:  Sepsis ([place]) / Cellulitis and abscess of left buttock / Leukocytosis - Sepsis criteria met on admission with fever, tachycardia, tachypnea, leukocytosis. Lactic acid was WNL. Procalcitonin was WNL. Cellulitis in left buttock area is presumed source of infection - WOC consulted, appreciate their assessment - Continue vanco and zosyn - Blood cultures to date are negative - Check CBC in am to monitor leukocytosis - No fevers since admission    DVT prophylaxis:  - SCD's bilaterally in hospital   Code Status: Full.  Family Communication:  plan of care discussed with the patient Disposition Plan: home likely by 03/10/2015.  IV access:  Peripheral IV  Procedures and diagnostic studies:    No results found.  Medical Consultants:  None   Other Consultants:  WOC   IAnti-Infectives:   Vanco and zosyn 03/07/2015 -->   Leisa Lenz, MD  [facility] Pager [phone]  Time spent in minutes: 25 minutes  If 7PM-7AM, please contact  night-coverage [email or web address] Password [facility] 03/09/2015, 12:03 PM   LOS: 2 days    HPI/Subjective: No acute overnight events. Patient reports she feels okay today.  Objective: Filed Vitals:   03/08/15 1403 03/09/15 0008 03/09/15 0500 03/09/15 0509  BP: 123/74 121/75  141/89  Pulse: 94 90  98  Temp: 99.1 F (37.3 C) 98.2 F (36.8 C)  99.3 F (37.4 C)  TempSrc: Oral Oral  Oral  Resp: _0 Height:      Weight:   69.627 kg (153 lb 8 oz)   SpO2: 94% 99%  100%    Intake/Output Summary (Last 24 hours) at 03/09/15 1203 Last data filed at 03/09/15 0606  Gross per 24 hour  Intake    450 ml  Output      0 ml  Net    450 ml    Exam:   General:  Pt is alert, not in acute distress  Cardiovascular: RRR, S1/S2 (+)  Respiratory: No wheezing, no crackles, no rhonchi  Abdomen: (+) BS, non tender abd  Extremities: No edema, pulses palpable, dressing in place over left buttock   Neuro: Nonfocal  Data Reviewed: Basic Metabolic Panel:  Recent Labs Lab 03/07/15 1202 03/08/15 0335  NA 136 138  K 3.9 3.5  CL 100* 103  CO2 27 26  GLUCOSE 111* 113*  BUN 8 9  CREATININE 0.91 0.78  CALCIUM 8.8* 8.5*   Liver Function Tests:  Recent Labs Lab 03/07/15 1202  AST 33  ALT 37  ALKPHOS 80  BILITOT  1.3*  PROT 7.6  ALBUMIN 3.4*   No results for input(s): LIPASE, AMYLASE in the last 168 hours. No results for input(s): AMMONIA in the last 168 hours. CBC:  Recent Labs Lab 03/07/15 1202 03/08/15 0335  WBC 14.3* 12.6*  NEUTROABS 10.9*  --   HGB 11.5* 10.4*  HCT 36.5 31.6*  MCV 88.4 86.6  PLT 237 205   Cardiac Enzymes: No results for input(s): CKTOTAL, CKMB, CKMBINDEX, TROPONINI in the last 168 hours. BNP: Invalid input(s): POCBNP CBG:  Recent Labs Lab 03/08/15 0737 03/09/15 0810  GLUCAP 83 101*    Recent Results (from the past 240 hour(s))  Culture, blood (routine x 2)     Status: None (Preliminary result)   Collection Time: 03/07/15 11:55 AM   Result Value Ref Range Status   Specimen Description BLOOD RIGHT ANTECUBITAL  Final   Special Requests BOTTLES DRAWN AEROBIC AND ANAEROBIC 5ML  Final   Culture   Final    NO GROWTH 2 DAYS Performed at [facility]    Report Status PENDING  Incomplete  Culture, blood (routine x 2)     Status: None (Preliminary result)   Collection Time: 03/07/15 12:38 PM  Result Value Ref Range Status   Specimen Description BLOOD LEFT FOREARM  Final   Special Requests BOTTLES DRAWN AEROBIC AND ANAEROBIC 5ML  Final   Culture   Final    NO GROWTH 2 DAYS Performed at [facility]    Report Status PENDING  Incomplete     Scheduled Meds: . piperacillin-tazobactam (ZOSYN)  IV  3.375 g Intravenous Q8H  . sodium chloride  3 mL Intravenous Q12H  . vancomycin  750 mg Intravenous Q12H   Continuous Infusions: . sodium chloride 10 mL/hr at 03/09/15 1030

## 2015-03-10 ENCOUNTER — Encounter (HOSPITAL_COMMUNITY): Payer: Self-pay | Admitting: Radiology

## 2015-03-10 ENCOUNTER — Inpatient Hospital Stay (HOSPITAL_COMMUNITY): Payer: 59

## 2015-03-10 LAB — CBC
HEMATOCRIT: 33.7 % — AB (ref 36.0–46.0)
HEMOGLOBIN: 10.6 g/dL — AB (ref 12.0–15.0)
MCH: 27.3 pg (ref 26.0–34.0)
MCHC: 31.5 g/dL (ref 30.0–36.0)
MCV: 86.9 fL (ref 78.0–100.0)
Platelets: 275 10*3/uL (ref 150–400)
RBC: 3.88 MIL/uL (ref 3.87–5.11)
RDW: 12.6 % (ref 11.5–15.5)
WBC: 8.4 10*3/uL (ref 4.0–10.5)

## 2015-03-10 LAB — GLUCOSE, CAPILLARY: Glucose-Capillary: 102 mg/dL — ABNORMAL HIGH (ref 65–99)

## 2015-03-10 MED ORDER — DOXYCYCLINE HYCLATE 100 MG PO TABS: 100.0000 mg | ORAL_TABLET | Freq: Two times a day (BID) | ORAL | Status: DC

## 2015-03-10 MED ORDER — IOHEXOL 300 MG/ML  SOLN
100.0000 mL | Freq: Once | INTRAMUSCULAR | Status: AC | PRN
  Administered 2015-03-10: 100 mL via INTRAVENOUS

## 2015-03-10 MED ORDER — ACETAMINOPHEN 325 MG PO TABS: 650.0000 mg | ORAL_TABLET | Freq: Four times a day (QID) | ORAL | Status: DC | PRN

## 2015-03-10 MED ORDER — AMOXICILLIN-POT CLAVULANATE 875-125 MG PO TABS: 1.0000 | ORAL_TABLET | Freq: Two times a day (BID) | ORAL | Status: DC

## 2015-03-10 NOTE — Discharge Summary (Signed)
Physician Discharge Summary  Michaela Wilson [id number] DOB: Dec 28, 1965 DOA: 03/07/2015  PCP: No PCP Per Patient - will follow up in [facility]   Admit date: 03/07/2015 Discharge date: 03/10/2015  Recommendations for Outpatient Follow-up:  1. Penrose drain will fall out on its own per my conversation with surgery 2. She will take Augmentin and doxycycline for 10 days on discharge   Discharge Diagnoses:  Principal Problem:   Sepsis ([place]) Active Problems:   Cellulitis of left buttock   Leukocytosis    Discharge Condition: stable   Diet recommendation: as tolerated   History of present illness:  49 year old female with no significant past medical history who presented to [facility] ED with worsening pain, swelling and erythema on the left buttock over past few days prior to this admission. It initially started as some pain which then progressed to more swelling, itching and then she noticed pus drainage in the area..Pt also had associated fevers at home. It was so painful per pt that she nearly passed out. Ibuprofen has not provided significant symptomatic relief.   In ED, T max was 103.1 F, HR 121, RR 24. WBC count was 14.3. Abscess in left buttock area was drained in ED. [facility]w as admitted for sepsis due to abscess and cellulitis and started on broad spectrum abx, vanco and zosyn.   Hospital Course:    Assessment/Plan:    Principal Problem:  Sepsis ([place]) / Cellulitis and abscess of left buttock / Leukocytosis - Sepsis criteria met on admission with fever, tachycardia, tachypnea, leukocytosis. Lactic acid was WNL. Procalcitonin was WNL. Cellulitis in left buttock area is presumed source of infection - WOC consulted, appreciate their assessment, no topical oint indicated - Penrose drain to fall out on its own per surgery - Take Augmentin dn doxy on discharge for 10 days - Pt was on vanco and zosyn till today  - Blood cultures to date are negative  DVT prophylaxis:  - SCD's  bilaterally   Code Status: Full.  Family Communication: plan of care discussed with the patient   IV access:  Peripheral IV  Procedures and diagnostic studies:   No results found.  Medical Consultants:  None   Other Consultants:  [facility]   IAnti-Infectives:   Vanco and zosyn 03/07/2015 --> 03/10/2015    Signed:  Leisa Lenz, MD  [facility] 03/10/2015, 10:50 AM  Pager #: [phone]  Time spent in minutes: more than 30 minutes   Discharge Exam: Filed Vitals:   03/09/15 2200 03/10/15 0549  BP: 130/81 123/71  Pulse: 83 81  Temp: 98 F (36.7 C) 98.2 F (36.8 C)  Resp: 20 20   Filed Vitals:   03/09/15 0509 03/09/15 1344 03/09/15 2200 03/10/15 0549  BP: 141/89 127/79 130/81 123/71  Pulse: 98 93 83 81  Temp: 99.3 F (37.4 C) 98.4 F (36.9 C) 98 F (36.7 C) 98.2 F (36.8 C)  TempSrc: Oral Oral Oral Oral  Resp: 17 18 20 20   Height:      Weight:      SpO2: 100% 100% 100% 99%    General: Pt is alert, follows commands appropriately, not in acute distress Cardiovascular: Regular rate and rhythm, S1/S2 +, no murmurs Respiratory: Clear to auscultation bilaterally, no wheezing, no crackles, no rhonchi Abdominal: Soft, non tender, non distended, bowel sounds +, no guarding Extremities: no edema, no cyanosis, pulses palpable bilaterally DP and PT Skin: dressing in place, serosanguineous drainage on gauze, erythema better on left buttock  Neuro: Grossly nonfocal  Discharge  Instructions  Discharge Instructions    Call MD for:  difficulty breathing, headache or visual disturbances    Complete by:  As directed      Call MD for:  persistant dizziness or light-headedness    Complete by:  As directed      Call MD for:  persistant nausea and vomiting    Complete by:  As directed      Call MD for:  severe uncontrolled pain    Complete by:  As directed      Diet - low sodium heart healthy    Complete by:  As directed      Discharge  instructions    Complete by:  As directed   Penrose drain should fall out on its own Please continue doxycyline and Augmentin for 10 days on discharge F/U with PCP in 1 week to make sure area is healing     Discharge wound care:    Complete by:  As directed   Please keep dressing in place     Increase activity slowly    Complete by:  As directed             Medication List    STOP taking these medications        ibuprofen 200 MG tablet  Commonly known as:  ADVIL,MOTRIN      TAKE these medications        acetaminophen 325 MG tablet  Commonly known as:  TYLENOL  Take 2 tablets (650 mg total) by mouth every 6 (six) hours as needed for mild pain (or Fever >/= 101).     amoxicillin-clavulanate 875-125 MG tablet  Commonly known as:  AUGMENTIN  Take 1 tablet by mouth 2 (two) times daily.     doxycycline 100 MG tablet  Commonly known as:  VIBRA-TABS  Take 1 tablet (100 mg total) by mouth 2 (two) times daily.           Follow-up Information    Follow up with Please use the list of in network providers for [facility] [employer] [facility] . Schedule an appointment as soon as possible for a visit on 03/12/2015.   Contact information:   given to you by the [facility] ED case manager to assist with finding a doctor for follow up care       Follow up with [facility]. Schedule an appointment as soon as possible for a visit in 1 week.   Why:  please call to make an appointment   Contact information:   [address] [place] [address] [phone]      Follow up with [facility].   Why:  If you can not make an appointment with [facility] call [facility] for an appointment    Contact information:   [place] [id number]        The results of significant diagnostics from this hospitalization (including imaging, microbiology, ancillary and laboratory) are listed  below for reference.    Significant Diagnostic Studies: Ct Pelvis W Contrast  03/10/2015  CLINICAL DATA:  Patient with surgical drainage catheter placed in the LEFT buttocks 3 4 days prior. Patient returns tumors department for evaluation. EXAM: CT PELVIS WITH CONTRAST TECHNIQUE: Multidetector CT imaging of the pelvis was performed using the standard protocol following the bolus administration of intravenous contrast. CONTRAST:  117m OMNIPAQUE IOHEXOL 300 MG/ML  SOLN COMPARISON:  None.  FINDINGS: There is a looped drainage catheter extending from the skin surface in the LEFT buttocks just off of midline. There is no organized fluid collection associated with the drainage catheter seen on image 79, series 603 and image 40 series 4. There is subcutaneous inflammation in the fat adjacent to the drainage catheter. No evidence of extension to the deep muscle layers. 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 The bladder is normal. The rectosigmoid colon appears normal. No pelvic lymphadenopathy. No aggressive osseous lesion. IMPRESSION: Mild subcutaneous inflammation associated with drainage catheter in the LEFT buttocks just off midline. No organized fluid collections. Electronically Signed   By: Suzy Bouchard M.D.   On: 03/10/2015 10:36    Microbiology: Recent Results (from the past 240 hour(s))  Culture, blood (routine x 2)     Status: None (Preliminary result)   Collection Time: 03/07/15 11:55 AM  Result Value Ref Range Status   Specimen Description BLOOD RIGHT ANTECUBITAL  Final   Special Requests BOTTLES DRAWN AEROBIC AND ANAEROBIC 5ML  Final   Culture   Final    NO GROWTH 2 DAYS Performed at [facility]    Report Status PENDING  Incomplete  Culture, blood (routine x 2)     Status: None (Preliminary result)   Collection Time: 03/07/15 12:38 PM  Result Value Ref Range Status   Specimen Description BLOOD LEFT FOREARM  Final   Special Requests BOTTLES DRAWN AEROBIC AND ANAEROBIC  5ML  Final   Culture   Final    NO GROWTH 2 DAYS Performed at [facility]    Report Status PENDING  Incomplete     Labs: Basic Metabolic Panel:  Recent Labs Lab 03/07/15 1202 03/08/15 0335  NA 136 138  K 3.9 3.5  CL 100* 103  CO2 27 26  GLUCOSE 111* 113*  BUN 8 9  CREATININE 0.91 0.78  CALCIUM 8.8* 8.5*   Liver Function Tests:  Recent Labs Lab 03/07/15 1202  AST 33  ALT 37  ALKPHOS 80  BILITOT 1.3*  PROT 7.6  ALBUMIN 3.4*   No results for input(s): LIPASE, AMYLASE in the last 168 hours. No results for input(s): AMMONIA in the last 168 hours. CBC:  Recent Labs Lab 03/07/15 1202 03/08/15 0335 03/10/15 0546  WBC 14.3* 12.6* 8.4  NEUTROABS 10.9*  --   --   HGB 11.5* 10.4* 10.6*  HCT 36.5 31.6* 33.7*  MCV 88.4 86.6 86.9  PLT 237 205 275   Cardiac Enzymes: No results for input(s): CKTOTAL, CKMB, CKMBINDEX, TROPONINI in the last 168 hours. BNP: BNP (last 3 results) No results for input(s): BNP in the last 8760 hours.  ProBNP (last 3 results) No results for input(s): PROBNP in the last 8760 hours.  CBG:  Recent Labs Lab 03/08/15 0737 03/09/15 0810 03/10/15 0742  GLUCAP 83 101* 102*

## 2015-03-10 NOTE — Discharge Instructions (Signed)

## 2015-03-12 LAB — CULTURE, BLOOD (ROUTINE X 2)
CULTURE: NO GROWTH
CULTURE: NO GROWTH

## 2015-03-15 ENCOUNTER — Encounter: Payer: Self-pay | Admitting: Family Medicine

## 2015-03-15 ENCOUNTER — Ambulatory Visit (INDEPENDENT_AMBULATORY_CARE_PROVIDER_SITE_OTHER): Payer: 59 | Admitting: Family Medicine

## 2015-03-15 DIAGNOSIS — D72829 Elevated white blood cell count, unspecified: Secondary | ICD-10-CM

## 2015-03-15 DIAGNOSIS — L03317 Cellulitis of buttock: Secondary | ICD-10-CM | POA: Diagnosis not present

## 2015-03-15 DIAGNOSIS — T8189XA Other complications of procedures, not elsewhere classified, initial encounter: Secondary | ICD-10-CM | POA: Insufficient documentation

## 2015-03-15 LAB — HEMOGLOBIN A1C
Hgb A1c MFr Bld: 6 % — ABNORMAL HIGH (ref <5.7)
Mean Plasma Glucose: 126 mg/dL — ABNORMAL HIGH (ref <117)

## 2015-03-15 NOTE — Patient Instructions (Addendum)
Referral sent to wound clinic for further evaluation.  Keep Penrose drain in place, it should fall out as tissue starts to granulate.    Cellulitis Cellulitis is an infection of the skin and the tissue beneath it. The infected area is usually red and tender. Cellulitis occurs most often in the arms and lower legs.  CAUSES  Cellulitis is caused by bacteria that enter the skin through cracks or cuts in the skin. The most common types of bacteria that cause cellulitis are staphylococci and streptococci. SIGNS AND SYMPTOMS   Redness and warmth.  Swelling.  Tenderness or pain.  Fever. DIAGNOSIS  Your health care provider can usually determine what is wrong based on a physical exam. Blood tests may also be done. TREATMENT  Treatment usually involves taking an antibiotic medicine. HOME CARE INSTRUCTIONS   Take your antibiotic medicine as directed by your health care provider. Finish the antibiotic even if you start to feel better.  Keep the infected arm or leg elevated to reduce swelling.  Apply a warm cloth to the affected area up to 4 times per day to relieve pain.  Take medicines only as directed by your health care provider.  Keep all follow-up visits as directed by your health care provider. SEEK MEDICAL CARE IF:   You notice red streaks coming from the infected area.  Your red area gets larger or turns dark in color.  Your bone or joint underneath the infected area becomes painful after the skin has healed.  Your infection returns in the same area or another area.  You notice a swollen bump in the infected area.  You develop new symptoms.  You have a fever. SEEK IMMEDIATE MEDICAL CARE IF:   You feel very sleepy.  You develop vomiting or diarrhea.  You have a general ill feeling (malaise) with muscle aches and pains.   This information is not intended to replace advice given to you by your health care provider. Make sure you discuss any questions you have with  your health care provider.   Document Released: 12/11/2004 Document Revised: 11/22/2014 Document Reviewed: 05/19/2011 Elsevier Interactive Patient Education [employer].

## 2015-03-15 NOTE — Progress Notes (Signed)
Subjective:    Patient ID: Michaela Wilson, female    DOB: April 20, 1965, 49 y.o.   MRN: [id number]  HPI  Michaela Wilson, a 49 year old female that presents to establish care. Patient was recently hospitalized for cellulitis to the right buttocks on 03/07/2015. She states that she had worsening and swelling to the right buttocks with pus and drainage. She states that she also had a temperature of 103 at that time. She had a Penrose drain inserted in the right buttocks. Patient is unable to sit for long periods of time due to increased discomfort. She states that she is changing dressing daily. She does not have a post hospital follow up scheduled with wound management. She denies fever, fatigue, current pain, dysuria, nausea, vomiting or diarrhea.  Past Medical History  Diagnosis Date  . Cellulitis of right buttock 03/07/2015   Social History   Social History  . Marital Status: Divorced    Spouse Name: N/A  . Number of Children: N/A  . Years of Education: N/A   Occupational History  . Not on file.   Social History Main Topics  . Smoking status: Never Smoker   . Smokeless tobacco: Never Used  . Alcohol Use: 0.0 oz/week    0 Standard drinks or equivalent per week     Comment: socially  . Drug Use: No  . Sexual Activity: Not Currently   Other Topics Concern  . Not on file   Social History Narrative   Past Surgical History  Procedure Laterality Date  . Tubal ligation    . Cholecystectomy     There is no immunization history on file for this patient. No Known Allergies Review of Systems  HENT: Negative.   Eyes: Negative.  Negative for photophobia and visual disturbance.  Respiratory: Negative.   Cardiovascular: Negative.   Gastrointestinal: Negative.   Endocrine: Negative.  Negative for polydipsia, polyphagia and polyuria.  Genitourinary: Negative.   Musculoskeletal: Negative.   Skin: Positive for wound.       Wound to right buttocks with Penrose drain.     Allergic/Immunologic: Negative for immunocompromised state.  Neurological: Negative.   Hematological: Negative.   Psychiatric/Behavioral: Negative.        Objective:   Physical Exam  Constitutional: She is oriented to person, place, and time. She appears well-developed and well-nourished.  HENT:  Head: Normocephalic and atraumatic.  Right Ear: External ear normal.  Left Ear: External ear normal.  Nose: Nose normal.  Mouth/Throat: Oropharynx is clear and moist.  Eyes: Conjunctivae and EOM are normal. Pupils are equal, round, and reactive to light.  Neck: Normal range of motion. Neck supple.  Cardiovascular: Normal rate, regular rhythm, normal heart sounds and intact distal pulses.   Pulmonary/Chest: Effort normal and breath sounds normal.  Abdominal: Soft. Bowel sounds are normal.  Musculoskeletal: Normal range of motion.  Neurological: She is alert and oriented to person, place, and time. She has normal reflexes.  Skin: Skin is warm and dry.     Psychiatric: She has a normal mood and affect. Her behavior is normal. Judgment and thought content normal.       BP 140/91 mmHg  Pulse 96  Temp(Src) 98.3 F (36.8 C) (Oral)  Resp 14  Ht 5\' 2"  (1.575 m)  Wt 159 lb (72.122 kg)  BMI 29.07 kg/m2  Assessment & Plan:  1. Cellulitis of right buttock Patient will continue antibiotics as previously prescribed. Reviewed discharge summary, which states that penrose drain will fall  out on its own per Dr. Fortino Sic conversation with surgery on 03/10/2015. Penrose drain is intact with minimal drainage. She continues to have discomfort and is unable to sit. The patient's job requires long periods of sitting. Patient warrants further evaluation of wound, will send referral to wound management. Will reevaluate patient in 1 week. Cleaned around wound outer tissue with normal saline, covered with 4X4 and abd dressing with milipore tape.  - Hemoglobin A1c - AMB referral to wound care center  2.  Leukocytosis - CBC with Differential  Routine Health Maintenance:  Pap smear 1 year ago (normal per patient) Recommend screening mammogram Recommend vaccinations (refused today) Recommend yearly eye examination  Recommend a lowfat, low carbohydrate diet divided over 5-6 small meals, increase water intake to 6-8 glasses, and 150 minutes per week of cardiovascular exercise.    RTC: 1 week for cellulitus of right buttock The patient was given clear instructions to go to ER or return to medical center if symptoms do not improve, worsen or new problems develop. The patient verbalized understanding. Will notify patient with laboratory results. Dorena Dew, FNP

## 2015-03-16 LAB — CBC WITH DIFFERENTIAL/PLATELET
Basophils Absolute: 0 10*3/uL (ref 0.0–0.1)
Basophils Relative: 0 % (ref 0–1)
EOS ABS: 0.2 10*3/uL (ref 0.0–0.7)
EOS PCT: 2 % (ref 0–5)
HCT: 38 % (ref 36.0–46.0)
Hemoglobin: 12.2 g/dL (ref 12.0–15.0)
LYMPHS ABS: 2.9 10*3/uL (ref 0.7–4.0)
Lymphocytes Relative: 28 % (ref 12–46)
MCH: 28.1 pg (ref 26.0–34.0)
MCHC: 32.1 g/dL (ref 30.0–36.0)
MCV: 87.6 fL (ref 78.0–100.0)
MONO ABS: 0.7 10*3/uL (ref 0.1–1.0)
MPV: 8.5 fL — AB (ref 8.6–12.4)
Monocytes Relative: 7 % (ref 3–12)
Neutro Abs: 6.4 10*3/uL (ref 1.7–7.7)
Neutrophils Relative %: 63 % (ref 43–77)
PLATELETS: 468 10*3/uL — AB (ref 150–400)
RBC: 4.34 MIL/uL (ref 3.87–5.11)
RDW: 13.1 % (ref 11.5–15.5)
WBC: 10.2 10*3/uL (ref 4.0–10.5)

## 2015-03-22 ENCOUNTER — Ambulatory Visit: Payer: 59 | Admitting: Family Medicine

## 2015-03-26 ENCOUNTER — Ambulatory Visit: Payer: 59 | Admitting: Family Medicine

## 2015-03-27 ENCOUNTER — Encounter: Payer: 59 | Attending: Surgery | Admitting: Surgery

## 2015-03-27 ENCOUNTER — Encounter: Payer: Self-pay | Admitting: Family Medicine

## 2015-03-27 ENCOUNTER — Ambulatory Visit (INDEPENDENT_AMBULATORY_CARE_PROVIDER_SITE_OTHER): Payer: 59 | Admitting: Family Medicine

## 2015-03-27 ENCOUNTER — Ambulatory Visit: Payer: 59 | Admitting: Family Medicine

## 2015-03-27 VITALS — BP 152/94 | HR 88 | Temp 98.1°F | Resp 16 | Ht 62.0 in | Wt 146.0 lb

## 2015-03-27 DIAGNOSIS — S31829A Unspecified open wound of left buttock, initial encounter: Secondary | ICD-10-CM | POA: Diagnosis not present

## 2015-03-27 DIAGNOSIS — Z9889 Other specified postprocedural states: Secondary | ICD-10-CM | POA: Insufficient documentation

## 2015-03-27 DIAGNOSIS — L03317 Cellulitis of buttock: Secondary | ICD-10-CM | POA: Diagnosis not present

## 2015-03-27 DIAGNOSIS — L0231 Cutaneous abscess of buttock: Secondary | ICD-10-CM | POA: Insufficient documentation

## 2015-03-27 DIAGNOSIS — Z5189 Encounter for other specified aftercare: Secondary | ICD-10-CM | POA: Diagnosis not present

## 2015-03-27 NOTE — Progress Notes (Signed)
Subjective:    Patient ID: Michaela Wilson, female    DOB: 02/01/1966, 50 y.o.   MRN: [id number]  HPI  [name], a 50 year old female that presents for a 1 week follow-up. Patient was recently hospitalized for cellulitis to the right buttocks on 03/07/2015. She states that she had increased discomfort to the right buttocks with minimal pus and drainage.  She has a Penrose drain inserted in the right buttocks. Patient is unable to sit for long periods of time due to increased discomfort. She states that she is changing dressing daily. She does not have a post hospital follow up scheduled with wound management. She denies fever, fatigue, current pain, dysuria, nausea, vomiting or diarrhea.  Past Medical History  Diagnosis Date  . Cellulitis of right buttock 03/07/2015   Social History   Social History  . Marital Status: Divorced    Spouse Name: N/A  . Number of Children: N/A  . Years of Education: N/A   Occupational History  . Not on file.   Social History Main Topics  . Smoking status: Never Smoker   . Smokeless tobacco: Never Used  . Alcohol Use: 0.0 oz/week    0 Standard drinks or equivalent per week     Comment: socially  . Drug Use: No  . Sexual Activity: Not Currently   Other Topics Concern  . Not on file   Social History Narrative   Past Surgical History  Procedure Laterality Date  . Tubal ligation    . Cholecystectomy     There is no immunization history on file for this patient. No Known Allergies Review of Systems  HENT: Negative.   Eyes: Negative.  Negative for photophobia and visual disturbance.  Respiratory: Negative.   Cardiovascular: Negative.   Gastrointestinal: Negative.   Endocrine: Negative.  Negative for polydipsia, polyphagia and polyuria.  Genitourinary: Negative.   Musculoskeletal: Negative.   Skin: Positive for wound.       Wound to right buttocks with Penrose drain.   Allergic/Immunologic: Negative for immunocompromised state.    Neurological: Negative.   Hematological: Negative.   Psychiatric/Behavioral: Negative.        Objective:   Physical Exam  Constitutional: She is oriented to person, place, and time. She appears well-developed and well-nourished.  HENT:  Head: Normocephalic and atraumatic.  Right Ear: External ear normal.  Left Ear: External ear normal.  Nose: Nose normal.  Mouth/Throat: Oropharynx is clear and moist.  Eyes: Conjunctivae and EOM are normal. Pupils are equal, round, and reactive to light.  Neck: Normal range of motion. Neck supple.  Cardiovascular: Normal rate, regular rhythm, normal heart sounds and intact distal pulses.   Pulmonary/Chest: Effort normal and breath sounds normal.  Abdominal: Soft. Bowel sounds are normal.  Musculoskeletal: Normal range of motion.  Neurological: She is alert and oriented to person, place, and time. She has normal reflexes.  Skin: Skin is warm and dry.     Psychiatric: She has a normal mood and affect. Her behavior is normal. Judgment and thought content normal.       BP 152/94 mmHg  Pulse 88  Temp(Src) 98.1 F (36.7 C) (Oral)  Resp 16  Ht 5\' 2"  (1.575 m)  Wt 146 lb (66.225 kg)  BMI 26.70 kg/m2  Assessment & Plan:  1. Cellulitis of right buttock Patient will continue antibiotics as previously prescribed. Reviewed discharge summary, which states that penrose drain will fall out on its own per Dr. Fortino Sic conversation with surgery  on 03/10/2015. Penrose drain is intact with minimal drainage. She continues to have discomfort and is unable to sit. The patient's job requires long periods of sitting. Patient warrants further evaluation of wound. Sent a referral to the Wound center 1 week ago, patient has not had an appointment scheduled. Notified the wound center at [facility], can evaluate wound today at 2:15 pm.  Patient can return to work on 03/27/2014. She is to follow up for a routine medical exam in 3 months.  2. Wound check, abscess Refer to  #1 RTC: 3 months for routine medical examination.    The patient was given clear instructions to go to ER or return to medical center if symptoms do not improve, worsen or new problems develop. The patient verbalized understanding. Will notify patient with laboratory results. Dorena Dew, FNP

## 2015-03-27 NOTE — Patient Instructions (Addendum)
   Patient has a wound evaluation at [facility] at 2:15 for further evaluation of right buttocks drain.    Wound Care Taking care of your wound properly can help to prevent pain and infection. It can also help your wound to heal more quickly.  HOW TO CARE FOR YOUR WOUND  Take or apply over-the-counter and prescription medicines only as told by your health care provider.  If you were prescribed antibiotic medicine, take or apply it as told by your health care provider. Do not stop using the antibiotic even if your condition improves.  Clean the wound each day or as told by your health care provider.  Wash the wound with mild soap and water.  Rinse the wound with water to remove all soap.  Pat the wound dry with a clean towel. Do not rub it.  There are many different ways to close and cover a wound. For example, a wound can be covered with stitches (sutures), skin glue, or adhesive strips. Follow instructions from your health care provider about:  How to take care of your wound.  When and how you should change your bandage (dressing).  When you should remove your dressing.  Removing whatever was used to close your wound.  Check your wound every day for signs of infection. Watch for:  Redness, swelling, or pain.  Fluid, blood, or pus.  Keep the dressing dry until your health care provider says it can be removed. Do not take baths, swim, use a hot tub, or do anything that would put your wound underwater until your health care provider approves.  Raise (elevate) the injured area above the level of your heart while you are sitting or lying down.  Do not scratch or pick at the wound.  Keep all follow-up visits as told by your health care provider. This is important. SEEK MEDICAL CARE IF:  You received a tetanus shot and you have swelling, severe pain, redness, or bleeding at the injection site.  You have a fever.  Your pain is not controlled with  medicine.  You have increased redness, swelling, or pain at the site of your wound.  You have fluid, blood, or pus coming from your wound.  You notice a bad smell coming from your wound or your dressing. SEEK IMMEDIATE MEDICAL CARE IF:  You have a red streak going away from your wound.   This information is not intended to replace advice given to you by your health care provider. Make sure you discuss any questions you have with your health care provider.   Document Released: 12/11/2007 Document Revised: 07/18/2014 Document Reviewed: 02/27/2014 Elsevier Interactive Patient Education [employer].

## 2015-03-28 NOTE — Progress Notes (Signed)
ROSA, CLAGETT ([id number]) Visit Report for 03/27/2015 Chief Complaint Document Details Patient Name: Michaela Wilson, Michaela Wilson L. Date of Service: 03/27/2015 2:15 PM Medical Record Number: [id number] Patient Account Number: [id number] Date of Birth/Sex: 27-Dec-1965 (50 y.o. Female) Treating RN: Carolyne Fiscal, Debi Primary Care Physician: Cammie Sickle Other Clinician: Referring Physician: Cammie Sickle Treating Physician/Extender: Frann Rider in Treatment: 0 Information Obtained from: Patient Chief Complaint Patient presents to the wound care center for a consult due non healing wound to the left buttock which she's had for about 3 weeks now. Electronic Signature(s) Signed: 03/27/2015 3:39:08 PM By: Christin Fudge MD, FACS Entered By: Christin Fudge on 03/27/2015 15:39:07 Michaela Wilson ([id number]) -------------------------------------------------------------------------------- Debridement Details Patient Name: Michaela Wilson, [name] Date of Service: 03/27/2015 2:15 PM Medical Record Number: [id number] Patient Account Number: [id number] Date of Birth/Sex: 1966-02-14 (50 y.o. Female) Treating RN: Carolyne Fiscal, Debi Primary Care Physician: Cammie Sickle Other Clinician: Referring Physician: Cammie Sickle Treating Physician/Extender: Frann Rider in Treatment: 0 Debridement Performed for Wound #1 Left,Superior Gluteus Assessment: Performed By: Physician Christin Fudge, MD Debridement: Open Wound/Selective Debridement Selective Description: Pre-procedure Yes Verification/Time Out Taken: Start Time: 03:30 Pain Control: Lidocaine 5% topical ointment Level: Non-Viable Tissue Total Area Debrided (L x 1 (cm) x 1 (cm) = 1 (cm) W): Tissue and other Non-Viable, Other material debrided: Instrument: Forceps, Scissors Bleeding: None End Time: 03:31 Procedural Pain: 0 Post Procedural Pain: 0 Response to Treatment: Procedure was tolerated well Post Debridement Measurements of Total  Wound Length: (cm) 1 Width: (cm) 1.1 Depth: (cm) 0.1 Volume: (cm) 0.086 Post Procedure Diagnosis Same as Pre-procedure Notes Penrose drain which was placed into the wound and then tied to itself externally was cut and removed. Drain site probed and this is in the subcutaneous tissue but fairly deep and communicating. Electronic Signature(s) Signed: 03/27/2015 3:38:46 PM By: Christin Fudge MD, FACS Signed: 03/27/2015 5:36:25 PM By: Tonie Griffith, Michaela Wilson ([id number]) Entered By: Christin Fudge on 03/27/2015 15:38:46 Michaela Wilson ([id number]) -------------------------------------------------------------------------------- HPI Details Patient Name: Michaela Wilson L. Date of Service: 03/27/2015 2:15 PM Medical Record Number: [id number] Patient Account Number: [id number] Date of Birth/Sex: December 08, 1965 (50 y.o. Female) Treating RN: Carolyne Fiscal, Debi Primary Care Physician: Cammie Sickle Other Clinician: Referring Physician: Cammie Sickle Treating Physician/Extender: Frann Rider in Treatment: 0 History of Present Illness Location: left gluteal IandD site with Penrose drain in situ Quality: Patient reports experiencing a dull pain to affected area(s). Severity: Patient states wound (s) are getting better. Duration: Patient has had the wound for < 3 weeks prior to presenting for treatment Timing: Pain in wound is constant (hurts all the time) Context: The wound occurred when the patient had an abscess to the left gluteal area which was IandD done in the ER and she was admitted for IV antibiotics Modifying Factors: Other treatment(s) tried include:IV and oral antibiotics and IandD of an abscess Associated Signs and Symptoms: Patient reports having difficulty sitting for long periods. HPI Description: 50 year old patient who had an abscess on the left gluteal area which was indeed in the ER on 03/07/2015. She had an element of sepsis and hence was admitted to the hospital  and given vancomycin and Zosyn. she was advised to take Augmentin and doxycycline for 10 days after discharge. She has nothing significant on past medical history and past surgical history she's a tubal ligation and cholecystectomy. Electronic Signature(s) Signed: 03/27/2015 3:40:30 PM By: Christin Fudge MD, FACS Previous Signature: 03/27/2015 3:15:17 PM Version By: Christin Fudge MD, FACS Entered By: Christin Fudge  on 03/27/2015 15:40:29 Michaela Wilson, [name] ([id number]) -------------------------------------------------------------------------------- Physical Exam Details Patient Name: Michaela Wilson, [name] Date of Service: 03/27/2015 2:15 PM Medical Record Number: [id number] Patient Account Number: [id number] Date of Birth/Sex: 04-20-1965 (50 y.o. Female) Treating RN: Carolyne Fiscal, Debi Primary Care Physician: Cammie Sickle Other Clinician: Referring Physician: Cammie Sickle Treating Physician/Extender: Frann Rider in Treatment: 0 Constitutional . Pulse regular. Respirations normal and unlabored. Afebrile. . Eyes Nonicteric. Reactive to light. Ears, Nose, Mouth, and Throat Lips, teeth, and gums WNL.Marland Kitchen Moist mucosa without lesions. Neck supple and nontender. No palpable supraclavicular or cervical adenopathy. Normal sized without goiter. Respiratory WNL. No retractions.. Cardiovascular Pedal Pulses WNL. No clubbing, cyanosis or edema. Gastrointestinal (GI) Abdomen without masses or tenderness.. No liver or spleen enlargement or tenderness.. Lymphatic No adneopathy. No adenopathy. No adenopathy. Musculoskeletal Adexa without tenderness or enlargement.. Digits and nails w/o clubbing, cyanosis, infection, petechiae, ischemia, or inflammatory conditions.. Integumentary (Hair, Skin) No suspicious lesions. No crepitus or fluctuance. No peri-wound warmth or erythema. No masses.Marland Kitchen Psychiatric Judgement and insight Intact.. No evidence of depression, anxiety, or agitation.. Notes left  gluteal region she has 2 openings made where the abscess was drained and the superior and inferior openings were dressed with a Penrose drain. Once the drain was removed she has some hyper granulation tissue and the tract is not very deep. This is communicating however. Electronic Signature(s) Signed: 03/27/2015 3:41:44 PM By: Christin Fudge MD, FACS Entered By: Christin Fudge on 03/27/2015 15:41:44 Michaela Wilson ([id number]) -------------------------------------------------------------------------------- Physician Orders Details Patient Name: Michaela Wilson L. Date of Service: 03/27/2015 2:15 PM Medical Record Number: [id number] Patient Account Number: [id number] Date of Birth/Sex: 1965-12-06 (50 y.o. Female) Treating RN: Carolyne Fiscal, Debi Primary Care Physician: Cammie Sickle Other Clinician: Referring Physician: Cammie Sickle Treating Physician/Extender: Frann Rider in Treatment: 0 Verbal / Phone Orders: Yes Clinician: Pinkerton, Debi Read Back and Verified: Yes Diagnosis Coding ICD-10 Coding Code Description L02.31 Cutaneous abscess of buttock S31.819A Unspecified open wound of right buttock, initial encounter Wound Cleansing Wound #1 Left,Superior Gluteus o Clean wound with Normal Saline. Wound #2 Left,Superior Gluteus o Clean wound with Normal Saline. Primary Wound Dressing Wound #1 Left,Superior Gluteus o Iodoform packing Gauze - 1/2 inch Wound #2 Left,Superior Gluteus o Iodoform packing Gauze - 1/2 inch Secondary Dressing o Dry Gauze Dressing Change Frequency Wound #1 Left,Superior Gluteus o Change dressing every day. Wound #2 Left,Superior Gluteus o Change dressing every day. Follow-up Appointments Wound #1 Left,Superior Gluteus o Return Appointment in 1 week. Wound #2 Left,Superior Gluteus o Return Appointment in 1 week. Michaela Wilson, WINNING ([id number]) Electronic Signature(s) Signed: 03/27/2015 4:32:01 PM By: Christin Fudge MD,  FACS Signed: 03/27/2015 5:36:25 PM By: Alric Quan Entered By: Alric Quan on 03/27/2015 15:33:47 Michaela Wilson ([id number]) -------------------------------------------------------------------------------- Problem List Details Patient Name: Michaela Wilson, [name] Date of Service: 03/27/2015 2:15 PM Medical Record Number: [id number] Patient Account Number: [id number] Date of Birth/Sex: [birth year] (50 y.o. Female) Treating RN: Carolyne Fiscal, Debi Primary Care Physician: Cammie Sickle Other Clinician: Referring Physician: Cammie Sickle Treating Physician/Extender: Frann Rider in Treatment: 0 Active Problems ICD-10 Encounter Code Description Active Date Diagnosis L02.31 Cutaneous abscess of buttock 03/27/2015 Yes S31.829A Unspecified open wound of left buttock, initial encounter 03/27/2015 Yes Inactive Problems Resolved Problems Electronic Signature(s) Signed: 03/27/2015 3:36:39 PM By: Christin Fudge MD, FACS Previous Signature: 03/27/2015 3:19:54 PM Version By: Christin Fudge MD, FACS Entered By: Christin Fudge on 03/27/2015 15:36:39 [place], Door ([id number]) -------------------------------------------------------------------------------- Progress Note Details Patient Name: Michaela Wilson, [name] Date of Service: 03/27/2015  2:15 PM Medical Record Number: [id number] Patient Account Number: [id number] Date of Birth/Sex: 1965/04/30 (50 y.o. Female) Treating RN: Carolyne Fiscal, Debi Primary Care Physician: Cammie Sickle Other Clinician: Referring Physician: Cammie Sickle Treating Physician/Extender: Frann Rider in Treatment: 0 Subjective Chief Complaint Information obtained from Patient Patient presents to the wound care center for a consult due non healing wound to the left buttock which she's had for about 3 weeks now. History of Present Illness (HPI) The following HPI elements were documented for the patient's wound: Location: left gluteal IandD site with Penrose  drain in situ Quality: Patient reports experiencing a dull pain to affected area(s). Severity: Patient states wound (s) are getting better. Duration: Patient has had the wound for < 3 weeks prior to presenting for treatment Timing: Pain in wound is constant (hurts all the time) Context: The wound occurred when the patient had an abscess to the left gluteal area which was IandD done in the ER and she was admitted for IV antibiotics Modifying Factors: Other treatment(s) tried include:IV and oral antibiotics and IandD of an abscess Associated Signs and Symptoms: Patient reports having difficulty sitting for long periods. 50 year old patient who had an abscess on the left gluteal area which was indeed in the ER on 03/07/2015. She had an element of sepsis and hence was admitted to the hospital and given vancomycin and Zosyn. she was advised to take Augmentin and doxycycline for 10 days after discharge. She has nothing significant on past medical history and past surgical history she's a tubal ligation and cholecystectomy. Wound History Patient presents with 2 open wounds that have been present for approximately 03/07/15. Patient has been treating wounds in the following manner: penrose drain and dressing. Laboratory tests have not been performed in the last month. Patient reportedly has not tested positive for an antibiotic resistant organism. Patient reportedly has not tested positive for osteomyelitis. Patient reportedly has not had testing performed to evaluate circulation in the legs. Patient experiences the following problems associated with their wounds: infection, swelling. Patient History Information obtained from Patient. Allergies No allergies have been documented for the patient Michaela Wilson, Michaela Wilson ([id number]) Family History Cancer - Paternal Grandparents, Maternal Grandparents, Father, Diabetes - Maternal Grandparents, Hypertension - Paternal Grandparents, Maternal Grandparents,  Thyroid Problems - Maternal Grandparents, No family history of Heart Disease, Hereditary Spherocytosis, Kidney Disease, Lung Disease, Seizures, Stroke, Tuberculosis. Social History Never smoker, Marital Status - Divorced, Alcohol Use - Rarely - social, Drug Use - No History, Caffeine Use - Daily. Medical History Hospitalization/Surgery History - 03/07/2015, [facility] [place]. Medical And Surgical History Notes Ear/Nose/Mouth/Throat sinus issues but nothing severe Review of Systems (ROS) Constitutional Symptoms (General Health) The patient has no complaints or symptoms. Eyes Complains or has symptoms of Glasses / Contacts - glasses for reading. Hematologic/Lymphatic The patient has no complaints or symptoms. Respiratory The patient has no complaints or symptoms. Cardiovascular The patient has no complaints or symptoms. Gastrointestinal The patient has no complaints or symptoms. Endocrine The patient has no complaints or symptoms. Genitourinary The patient has no complaints or symptoms. Immunological The patient has no complaints or symptoms. Integumentary (Skin) Complains or has symptoms of Wounds - 2. Musculoskeletal The patient has no complaints or symptoms. Neurologic The patient has no complaints or symptoms. Oncologic The patient has no complaints or symptoms. Psychiatric Complains or has symptoms of Anxiety - recently. Michaela Wilson, [name] ([id number]) Objective Constitutional Pulse regular. Respirations normal and unlabored. Afebrile. Vitals Time Taken: 2:57 PM, Height: 62 in, Source: Stated,  Weight: 146 lbs, Source: Stated, BMI: 26.7, Temperature: 97.7 F, Pulse: 103 bpm, Respiratory Rate: 20 breaths/min, Blood Pressure: 151/107 mmHg. Eyes Nonicteric. Reactive to light. Ears, Nose, Mouth, and Throat Lips, teeth, and gums WNL.Marland Kitchen Moist mucosa without lesions. Neck supple and nontender. No palpable supraclavicular or cervical adenopathy. Normal sized  without goiter. Respiratory WNL. No retractions.. Cardiovascular Pedal Pulses WNL. No clubbing, cyanosis or edema. Gastrointestinal (GI) Abdomen without masses or tenderness.. No liver or spleen enlargement or tenderness.. Lymphatic No adneopathy. No adenopathy. No adenopathy. Musculoskeletal Adexa without tenderness or enlargement.. Digits and nails w/o clubbing, cyanosis, infection, petechiae, ischemia, or inflammatory conditions.Marland Kitchen Psychiatric Judgement and insight Intact.. No evidence of depression, anxiety, or agitation.. General Notes: left gluteal region she has 2 openings made where the abscess was drained and the superior and inferior openings were dressed with a Penrose drain. Once the drain was removed she has some hyper granulation tissue and the tract is not very deep. This is communicating however. Integumentary (Hair, Skin) No suspicious lesions. No crepitus or fluctuance. No peri-wound warmth or erythema. No masses.. Wound #1 status is Open. Original cause of wound was Surgical Injury. The wound is located on the [place]. The wound measures 1cm length x 1.1cm width x 0.1cm depth; 0.864cm^2 area and Michaela Wilson, [name] ([id number]) 0.086cm^3 volume. The wound is limited to skin breakdown. There is no tunneling or undermining noted. There is a medium amount of serosanguineous drainage noted. The wound margin is flat and intact. There is large (67-100%) red, pink granulation within the wound bed. There is no necrotic tissue within the wound bed. The periwound skin appearance exhibited: Moist. Periwound temperature was noted as No Abnormality. The periwound has tenderness on palpation. Wound #2 status is Open. Original cause of wound was Surgical Injury. The wound is located on the [place]. The wound measures 1.5cm length x 1.4cm width x 0.1cm depth; 1.649cm^2 area and 0.165cm^3 volume. The wound is limited to skin breakdown. There is no tunneling or  undermining noted. There is a medium amount of serosanguineous drainage noted. The wound margin is thickened. There is large (67-100%) red, pink granulation within the wound bed. There is no necrotic tissue within the wound bed. The periwound skin appearance exhibited: Localized Edema, Moist. Periwound temperature was noted as No Abnormality. The periwound has tenderness on palpation. Assessment Active Problems ICD-10 L02.31 - Cutaneous abscess of buttock S31.829A - Unspecified open wound of left buttock, initial encounter This patient had an IandD of a left gluteal abscess which had a Penrose drain in situ and this was ready for removal. Once this was done the sinus tract was probed and will be packed with half half-inch iodoform gauze. The gauze packing should be done on a daily basis until the wound heals appropriately. She will come back and see me next week. Procedures Wound #1 Wound #1 is an Open Surgical Wound located on the Left,Superior Gluteus . There was a Non-Viable Tissue Open Wound/Selective [phone]) debridement with total area of 1 sq cm performed by Christin Fudge, MD. with the following instrument(s): Forceps and Scissors to remove Non-Viable tissue/material including Other after achieving pain control using Lidocaine 5% topical ointment. A time out was conducted prior to the start of the procedure. There was no bleeding. The procedure was tolerated well with a pain level of 0 throughout and a pain level of 0 following the procedure. Post Debridement Measurements: 1cm length x 1.1cm width x 0.1cm depth; 0.086cm^3 volume. Post procedure Diagnosis Wound #1: Same  as Pre-Procedure General Notes: Penrose drain which was placed into the wound and then tied to itself externally was cut and removed. Drain site probed and this is in the subcutaneous tissue but fairly deep and communicating.Michaela Wilson, Michaela Wilson ([id number]) Plan Wound Cleansing: Wound #1 Left,Superior  Gluteus: Clean wound with Normal Saline. Wound #2 Left,Superior Gluteus: Clean wound with Normal Saline. Primary Wound Dressing: Wound #1 Left,Superior Gluteus: Iodoform packing Gauze - 1/2 inch Wound #2 Left,Superior Gluteus: Iodoform packing Gauze - 1/2 inch Secondary Dressing: Dry Gauze Dressing Change Frequency: Wound #1 Left,Superior Gluteus: Change dressing every day. Wound #2 Left,Superior Gluteus: Change dressing every day. Follow-up Appointments: Wound #1 Left,Superior Gluteus: Return Appointment in 1 week. Wound #2 Left,Superior Gluteus: Return Appointment in 1 week. This patient had an IandD of a left gluteal abscess which had a Penrose drain in situ and this was ready for removal. Once this was done the sinus tract was probed and will be packed with half half-inch iodoform gauze. The gauze packing should be done on a daily basis until the wound heals appropriately. She will come back and see me next week. Electronic Signature(s) Signed: 03/27/2015 3:42:59 PM By: Christin Fudge MD, FACS Entered By: Christin Fudge on 03/27/2015 15:42:59 Michaela Wilson ([id number]) -------------------------------------------------------------------------------- ROS/PFSH Details Patient Name: Michaela Wilson L. Date of Service: 03/27/2015 2:15 PM Medical Record Number: [id number] Patient Account Number: [id number] Date of Birth/Sex: 1965/10/18 (50 y.o. Female) Treating RN: Carolyne Fiscal, Debi Primary Care Physician: Cammie Sickle Other Clinician: Referring Physician: Cammie Sickle Treating Physician/Extender: Frann Rider in Treatment: 0 Information Obtained From Patient Wound History Do you currently have one or more open woundso Yes How many open wounds do you currently haveo 2 Approximately how long have you had your woundso 03/07/15 How have you been treating your wound(s) until nowo penrose drain and dressing Has your wound(s) ever healed and then re-openedo No Have you  had any lab work done in the past montho No Have you tested positive for an antibiotic resistant organism (MRSA, No VRE)o Have you tested positive for osteomyelitis (bone infection)o No Have you had any tests for circulation on your legso No Have you had other problems associated with your woundso Infection, Swelling Eyes Complaints and Symptoms: Positive for: Glasses / Contacts - glasses for reading Integumentary (Skin) Complaints and Symptoms: Positive for: Wounds - 2 Psychiatric Complaints and Symptoms: Positive for: Anxiety - recently Constitutional Symptoms (General Health) Complaints and Symptoms: No Complaints or Symptoms Ear/Nose/Mouth/Throat Medical History: Past Medical History Notes: sinus issues but nothing severe Michaela Wilson, [name] ([id number]) Hematologic/Lymphatic Complaints and Symptoms: No Complaints or Symptoms Respiratory Complaints and Symptoms: No Complaints or Symptoms Cardiovascular Complaints and Symptoms: No Complaints or Symptoms Gastrointestinal Complaints and Symptoms: No Complaints or Symptoms Endocrine Complaints and Symptoms: No Complaints or Symptoms Genitourinary Complaints and Symptoms: No Complaints or Symptoms Immunological Complaints and Symptoms: No Complaints or Symptoms Musculoskeletal Complaints and Symptoms: No Complaints or Symptoms Neurologic Complaints and Symptoms: No Complaints or Symptoms Oncologic Complaints and Symptoms: No Complaints or Symptoms Hospitalization / Surgery History Name of Hospital Purpose of Hospitalization/Surgery Date [place] 03/07/2015 [facility] ([id number]) Family and Social History Cancer: Yes - Paternal Grandparents, Maternal Grandparents, Father; Diabetes: Yes - Maternal Grandparents; Heart Disease: No; Hereditary Spherocytosis: No; Hypertension: Yes - Paternal Grandparents, Maternal Grandparents; Kidney Disease: No; Lung Disease: No; Seizures: No; Stroke:  No; Thyroid Problems: Yes - Maternal Grandparents; Tuberculosis: No; Never smoker; Marital Status - Divorced; Alcohol Use: Rarely - social;  Drug Use: No History; Caffeine Use: Daily; Financial Concerns: No; Food, Clothing or Shelter Needs: No; Support System Lacking: No; Transportation Concerns: No; Advanced Directives: No; Patient does not want information on Advanced Directives; Do not resuscitate: No; Living Will: No; Medical Power of Attorney: No Physician Affirmation I have reviewed and agree with the above information. Electronic Signature(s) Signed: 03/27/2015 3:15:34 PM By: Christin Fudge MD, FACS Signed: 03/27/2015 5:36:25 PM By: Alric Quan Entered By: Christin Fudge on 03/27/2015 15:15:34 Michaela Wilson ([id number]) -------------------------------------------------------------------------------- SuperBill Details Patient Name: Harding, Tytionna L. Date of Service: 03/27/2015 Medical Record Number: [id number] Patient Account Number: [id number] Date of Birth/Sex: 07/11/1965 (50 y.o. Female) Treating RN: Carolyne Fiscal, Debi Primary Care Physician: Cammie Sickle Other Clinician: Referring Physician: Cammie Sickle Treating Physician/Extender: Frann Rider in Treatment: 0 Diagnosis Coding ICD-10 Codes Code Description L02.31 Cutaneous abscess of buttock S31.829A Unspecified open wound of left buttock, initial encounter Facility Procedures CPT4 Code: [id number] Description: [phone] - WOUND CARE VISIT-LEV 2 EST PT Modifier: Quantity: 1 CPT4 Code: [id number] Description: [id number] - DEBRIDE WOUND 1ST 20 SQ CM OR < ICD-10 Description Diagnosis L02.31 Cutaneous abscess of buttock S31.829A Unspecified open wound of left buttock, initial e Modifier: ncounter Quantity: 1 Physician Procedures CPT4 Code: [id number] Description: [id number] - WC PHYS LEVEL 4 - NEW PT ICD-10 Description Diagnosis L02.31 Cutaneous abscess of buttock S31.829A Unspecified open wound of left buttock, initial  en Modifier: counter Quantity: 1 CPT4 Code: [id number] Description: [id number] - WC PHYS DEBR WO ANESTH 20 SQ CM ICD-10 Description Diagnosis L02.31 Cutaneous abscess of buttock S31.829A Unspecified open wound of left buttock, initial en Modifier: counter Quantity: 1 Electronic Signature(s) Signed: 03/27/2015 5:11:17 PM By: Christin Fudge MD, FACS Signed: 03/27/2015 5:36:25 PM By: Alric Quan Previous Signature: 03/27/2015 3:43:19 PM Version By: Christin Fudge MD, FACS Michaela Wilson ([id number]) Entered By: Alric Quan on 03/27/2015 16:32:40

## 2015-03-28 NOTE — Progress Notes (Addendum)
KAILE, CARRERA ([id number]) Visit Report for 03/27/2015 Allergy List Details Patient Name: Michaela Wilson, [name] Date of Service: 03/27/2015 2:15 PM Medical Record Number: [id number] Patient Account Number: [id number] Date of Birth/Sex: 10-Oct-1965 (50 y.o. Female) Treating RN: Michaela Wilson Primary Wilson Physician: Michaela Wilson Other Clinician: Referring Physician: Cammie Wilson Treating Physician/Extender: Michaela Wilson in Treatment: 0 Electronic Signature(s) Signed: 03/27/2015 5:36:25 PM By: Michaela Wilson Entered By: Michaela Wilson on 03/27/2015 15:02:38 Michaela Wilson ([id number]) -------------------------------------------------------------------------------- Arrival Information Details Patient Name: Michaela Wilson, [name] Date of Service: 03/27/2015 2:15 PM Medical Record Number: [id number] Patient Account Number: [id number] Date of Birth/Sex: 07-22-1965 (50 y.o. Female) Treating RN: Michaela Wilson Primary Wilson Physician: Michaela Wilson Other Clinician: Referring Physician: Cammie Wilson Treating Physician/Extender: Michaela Wilson in Treatment: 0 Visit Information Patient Arrived: Ambulatory Arrival Time: 14:51 Accompanied By: self Transfer Assistance: None Patient Identification Verified: Yes Secondary Verification Process Yes Completed: Patient Requires Transmission-Based No Precautions: Patient Has Alerts: No Electronic Signature(s) Signed: 03/27/2015 5:36:25 PM By: Michaela Wilson Entered By: Michaela Wilson on 03/27/2015 14:56:45 Michaela Wilson, Michaela Wilson ([id number]) -------------------------------------------------------------------------------- Clinic Level of Wilson Assessment Details Patient Name: Michaela Hinders L. Date of Service: 03/27/2015 2:15 PM Medical Record Number: [id number] Patient Account Number: [id number] Date of Birth/Sex: 02/20/1966 (50 y.o. Female) Treating RN: Michaela Wilson Primary Wilson Physician: Michaela Wilson Other  Clinician: Referring Physician: Cammie Wilson Treating Physician/Extender: Michaela Wilson in Treatment: 0 Clinic Level of Wilson Assessment Items TOOL 1 Quantity Score X - Use when EandM and Procedure is performed on INITIAL visit 1 0 ASSESSMENTS - Nursing Assessment / Reassessment []  - General Physical Exam (combine w/ comprehensive assessment (listed just 0 below) when performed on new pt. evals) X - Comprehensive Assessment (HX, ROS, Risk Assessments, Wounds Hx, etc.) 1 25 ASSESSMENTS - Wound and Skin Assessment / Reassessment []  - Dermatologic / Skin Assessment (not related to wound area) 0 ASSESSMENTS - Ostomy and/or Continence Assessment and Wilson []  - Incontinence Assessment and Management 0 []  - Ostomy Wilson Assessment and Management (repouching, etc.) 0 PROCESS - Coordination of Wilson []  - Simple Patient / Family Education for ongoing Wilson 0 X - Complex (extensive) Patient / Family Education for ongoing Wilson 1 20 []  - Staff obtains Programmer, systems, Records, Test Results / Process Orders 0 []  - Staff telephones HHA, Nursing Homes / Clarify orders / etc 0 []  - Routine Transfer to another Facility (non-emergent condition) 0 []  - Routine Hospital Admission (non-emergent condition) 0 X - New Admissions / Biomedical engineer / Ordering NPWT, Apligraf, etc. 1 15 []  - Emergency Hospital Admission (emergent condition) 0 PROCESS - Special Needs []  - Pediatric / Minor Patient Management 0 []  - Isolation Patient Management 0 Michaela Wilson, [name] ([id number]) []  - Hearing / Language / Visual special needs 0 []  - Assessment of Community assistance (transportation, D/C planning, etc.) 0 []  - Additional assistance / Altered mentation 0 []  - Support Surface(s) Assessment (bed, cushion, seat, etc.) 0 INTERVENTIONS - Miscellaneous []  - External ear exam 0 []  - Patient Transfer (multiple staff / Civil Service fast streamer / Similar devices) 0 []  - Simple Staple / Suture removal (25 or less) 0 []  - Complex  Staple / Suture removal (26 or more) 0 []  - Hypo/Hyperglycemic Management (do not check if billed separately) 0 []  - Ankle / Brachial Index (ABI) - do not check if billed separately 0 Has the patient been seen at the hospital within the last three years: Yes Total Score: 60 Level Of Wilson: New/Established - Level 2 Electronic  Signature(s) Signed: 03/27/2015 5:36:25 PM By: Michaela Wilson Entered By: Michaela Wilson on 03/27/2015 16:32:29 Michaela Wilson ([id number]) -------------------------------------------------------------------------------- Encounter Discharge Information Details Patient Name: Michaela Wilson, [name] Date of Service: 03/27/2015 2:15 PM Medical Record Number: [id number] Patient Account Number: [id number] Date of Birth/Sex: 16-Apr-1965 (50 y.o. Female) Treating RN: Michaela Wilson Primary Wilson Physician: Michaela Wilson Other Clinician: Referring Physician: Cammie Wilson Treating Physician/Extender: Michaela Wilson in Treatment: 0 Encounter Discharge Information Items Discharge Pain Level: 0 Discharge Condition: Stable Ambulatory Status: Ambulatory Discharge Destination: Home Transportation: Private Auto Accompanied By: self Schedule Follow-up Appointment: Yes Medication Reconciliation completed and provided to Patient/Wilson No Michaela Wilson: 03/27/2015 Form Type Recipient Paper Patient TL Electronic Signature(s) Signed: 03/27/2015 3:49:10 PM By: Michaela Wilson Entered By: Michaela Wilson on 03/27/2015 15:49:10 Michaela Wilson, [name] ([id number]) -------------------------------------------------------------------------------- Lower Extremity Assessment Details Patient Name: [name], [name] Date of Service: 03/27/2015 2:15 PM Medical Record Number: [id number] Patient Account Number: [id number] Date of Birth/Sex: 12-13-1965 (50 y.o. Female) Treating RN: Michaela Wilson Primary Wilson Physician: Michaela Wilson Other Clinician: Referring  Physician: Cammie Wilson Treating Physician/Extender: Michaela Wilson in Treatment: 0 Electronic Signature(s) Signed: 03/27/2015 5:36:25 PM By: Michaela Wilson Entered By: Michaela Wilson on 03/27/2015 15:00:21 Michaela Wilson, Michaela Wilson ([id number]) -------------------------------------------------------------------------------- Multi Wound Chart Details Patient Name: Michaela Wilson, [name] Date of Service: 03/27/2015 2:15 PM Medical Record Number: [id number] Patient Account Number: [id number] Date of Birth/Sex: July 07, 1965 (50 y.o. Female) Treating RN: Michaela Wilson Primary Wilson Physician: Michaela Wilson Other Clinician: Referring Physician: Cammie Wilson Treating Physician/Extender: Michaela Wilson in Treatment: 0 Vital Signs Height(in): 62 Pulse(bpm): 103 Weight(lbs): 146 Blood Pressure 151/107 (mmHg): Body Mass Index(BMI): 27 Temperature(F): 97.7 Respiratory Rate 20 (breaths/min): Photos: [1:No Photos] [2:No Photos] [N/A:N/A] Wound Location: [1:Left Gluteus - Superior] [2:Left Gluteus - Superior] [N/A:N/A] Wounding Event: [1:Surgical Injury] [2:Surgical Injury] [N/A:N/A] Primary Etiology: [1:Open Surgical Wound] [2:Open Surgical Wound] [N/A:N/A] Date Acquired: [1:03/09/2015] [2:03/09/2015] [N/A:N/A] Weeks of Treatment: [1:0] [2:0] [N/A:N/A] Wound Status: [1:Open] [2:Open] [N/A:N/A] Measurements L x W x D 1x1.1x0.1 [2:1.5x1.4x0.1] [N/A:N/A] (cm) Area (cm) : [1:0.864] [2:1.649] [N/A:N/A] Volume (cm) : [1:0.086] [2:0.165] [N/A:N/A] Classification: [1:Partial Thickness] [2:Partial Thickness] [N/A:N/A] Exudate Amount: [1:Medium] [2:Medium] [N/A:N/A] Exudate Type: [1:Serosanguineous] [2:Serosanguineous] [N/A:N/A] Exudate Color: [1:red, brown] [2:red, brown] [N/A:N/A] Wound Margin: [1:Flat and Intact] [2:Thickened] [N/A:N/A] Granulation Amount: [1:Large (67-100%)] [2:Large (67-100%)] [N/A:N/A] Granulation Quality: [1:Red, Pink] [2:Red, Pink] [N/A:N/A] Necrotic Amount:  [1:None Present (0%)] [2:None Present (0%)] [N/A:N/A] Exposed Structures: [1:Fascia: No Fat: No Tendon: No Muscle: No Joint: No Bone: No Limited to Skin Breakdown] [2:Fascia: No Fat: No Tendon: No Muscle: No Joint: No Bone: No Limited to Skin Breakdown] [N/A:N/A] Epithelialization: [1:None] [2:None] [N/A:N/A] Periwound Skin Texture: No Abnormalities Noted [1:Moist: Yes] [2:Edema: Yes Moist: Yes] [N/A:N/A N/A] Periwound Skin Moisture: Periwound Skin Color: No Abnormalities Noted No Abnormalities Noted N/A Temperature: No Abnormality No Abnormality N/A Tenderness on Yes Yes N/A Palpation: Wound Preparation: Ulcer Cleansing: Ulcer Cleansing: N/A Rinsed/Irrigated with Rinsed/Irrigated with Saline Saline Treatment Notes Electronic Signature(s) Signed: 03/27/2015 5:36:25 PM By: Michaela Wilson Entered By: Michaela Wilson on 03/27/2015 15:21:44 Michaela Wilson ([id number]) -------------------------------------------------------------------------------- [facility] Details Patient Name: Michaela Wilson, [name] Date of Service: 03/27/2015 2:15 PM Medical Record Number: [id number] Patient Account Number: [id number] Date of Birth/Sex: 1966/02/12 (50 y.o. Female) Treating RN: Michaela Wilson Primary Wilson Physician: Michaela Wilson Other Clinician: Referring Physician: Cammie Wilson Treating Physician/Extender: Michaela Wilson in Treatment: 0 Active Inactive Abuse / Safety / Falls / Self Wilson Management Nursing Diagnoses: Potential for  falls Goals: Patient will remain injury free Date Initiated: 03/28/2015 Goal Status: Active Interventions: Assess fall risk on admission and as needed Notes: Orientation to the Wound Wilson Program Nursing Diagnoses: Knowledge deficit related to the wound healing center program Goals: Patient/caregiver will verbalize understanding of the [facility] Date Initiated: 03/28/2015 Goal Status: Active Interventions: Provide  education on orientation to the wound center Notes: Pain, Acute or Chronic Nursing Diagnoses: Pain, acute or chronic: actual or potential Potential alteration in comfort, pain Goals: Michaela Wilson, [name] ([id number]) Patient will verbalize adequate pain control and receive pain control interventions during procedures as needed Date Initiated: 03/28/2015 Goal Status: Active Interventions: Assess comfort goal upon admission Notes: Wound/Skin Impairment Nursing Diagnoses: Impaired tissue integrity Goals: Ulcer/skin breakdown will have a volume reduction of 30% by week 4 Date Initiated: 03/28/2015 Goal Status: Active Ulcer/skin breakdown will have a volume reduction of 50% by week 8 Date Initiated: 03/28/2015 Goal Status: Active Ulcer/skin breakdown will have a volume reduction of 80% by week 12 Date Initiated: 03/28/2015 Goal Status: Active Interventions: Assess patient/caregiver ability to obtain necessary supplies Assess patient/caregiver ability to perform ulcer/skin Wilson regimen upon admission and as needed Notes: Electronic Signature(s) Signed: 03/28/2015 4:31:17 PM By: Michaela Wilson Previous Signature: 03/27/2015 5:36:25 PM Version By: Michaela Wilson Entered By: Michaela Wilson on 03/28/2015 13:54:58 Michaela Wilson, [name] ([id number]) -------------------------------------------------------------------------------- Pain Assessment Details Patient Name: Michaela Wilson, Claudia L. Date of Service: 03/27/2015 2:15 PM Medical Record Number: [id number] Patient Account Number: [id number] Date of Birth/Sex: [birth year] (50 y.o. Female) Treating RN: Michaela Wilson Primary Wilson Physician: Michaela Wilson Other Clinician: Referring Physician: Cammie Wilson Treating Physician/Extender: Michaela Wilson in Treatment: 0 Active Problems Location of Pain Severity and Description of Pain Patient Has Paino Yes Site Locations Pain Location: Pain in Ulcers With Dressing Change: Yes Duration of  the Pain. Constant / Intermittento Constant Rate the pain. Current Pain Level: 4 Character of Pain Describe the Pain: Throbbing Pain Management and Medication Current Pain Management: Electronic Signature(s) Signed: 03/27/2015 5:36:25 PM By: Michaela Wilson Entered By: Michaela Wilson on 03/27/2015 14:57:03 Michaela Wilson ([id number]) -------------------------------------------------------------------------------- Patient/Caregiver Education Details Patient Name: Michaela Wilson, [name] Date of Service: 03/27/2015 2:15 PM Medical Record Number: [id number] Patient Account Number: [id number] Date of Birth/Gender: 23-Sep-1965 (50 y.o. Female) Treating RN: Michaela Wilson Primary Wilson Physician: Michaela Wilson Other Clinician: Referring Physician: Cammie Wilson Treating Physician/Extender: Michaela Wilson in Treatment: 0 Education Assessment Education Provided To: Patient Education Topics Provided Wound/Skin Impairment: Handouts: Other: change dressing as ordered Methods: Demonstration, Explain/Verbal Responses: State content correctly Electronic Signature(s) Signed: 03/27/2015 5:36:25 PM By: Michaela Wilson Entered By: Michaela Wilson on 03/27/2015 15:35:38 Michaela Wilson, Michaela Wilson ([id number]) -------------------------------------------------------------------------------- Wound Assessment Details Patient Name: Michaela Wilson, [name] Date of Service: 03/27/2015 2:15 PM Medical Record Number: [id number] Patient Account Number: [id number] Date of Birth/Sex: 12-Jul-1965 (50 y.o. Female) Treating RN: Michaela Wilson Primary Wilson Physician: Michaela Wilson Other Clinician: Referring Physician: Cammie Wilson Treating Physician/Extender: Michaela Wilson in Treatment: 0 Wound Status Wound Number: 1 Primary Etiology: Open Surgical Wound Wound Location: Left Gluteus - Superior Wound Status: Open Wounding Event: Surgical Injury Date Acquired: 03/09/2015 Weeks Of Treatment: 0 Clustered  Wound: No Photos Photo Uploaded By: Michaela Wilson on 03/27/2015 16:43:22 Wound Measurements Length: (cm) 1 Width: (cm) 1.1 Depth: (cm) 0.1 Area: (cm) 0.864 Volume: (cm) 0.086 % Reduction in Area: % Reduction in Volume: Epithelialization: None Tunneling: No Undermining: No Wound Description Classification: Partial Thickness Wound Margin: Flat and Intact Exudate Amount: Medium Exudate Type: Serosanguineous  Exudate Color: red, brown Foul Odor After Cleansing: No Wound Bed Granulation Amount: Large (67-100%) Exposed Structure Granulation Quality: Red, Pink Fascia Exposed: No Necrotic Amount: None Present (0%) Fat Layer Exposed: No Tendon Exposed: No Pennick, Tylicia L. ([id number]) Muscle Exposed: No Joint Exposed: No Bone Exposed: No Limited to Skin Breakdown Periwound Skin Texture Texture Color No Abnormalities Noted: No No Abnormalities Noted: No Moisture Temperature / Pain No Abnormalities Noted: No Temperature: No Abnormality Moist: Yes Tenderness on Palpation: Yes Wound Preparation Ulcer Cleansing: Rinsed/Irrigated with Saline Treatment Notes Wound #1 (Left, Superior Gluteus) 1. Cleansed with: Clean wound with Normal Saline 4. Dressing Applied: Iodoform packing Gauze 5. Secondary Dressing Applied Dry Gauze 7. Secured with Recruitment consultant) Signed: 03/27/2015 5:36:25 PM By: Michaela Wilson Entered By: Michaela Wilson on 03/27/2015 15:18:49 Michaela Wilson ([id number]) -------------------------------------------------------------------------------- Wound Assessment Details Patient Name: Perrell, Gavin L. Date of Service: 03/27/2015 2:15 PM Medical Record Number: [id number] Patient Account Number: [id number] Date of Birth/Sex: Jul 21, 1965 (50 y.o. Female) Treating RN: Michaela Wilson Primary Wilson Physician: Michaela Wilson Other Clinician: Referring Physician: Cammie Wilson Treating Physician/Extender: Michaela Wilson in Treatment:  0 Wound Status Wound Number: 2 Primary Etiology: Open Surgical Wound Wound Location: Left Gluteus - Superior Wound Status: Open Wounding Event: Surgical Injury Date Acquired: 03/09/2015 Weeks Of Treatment: 0 Clustered Wound: No Photos Photo Uploaded By: Michaela Wilson on 03/27/2015 16:43:43 Wound Measurements Length: (cm) 1.5 Width: (cm) 1.4 Depth: (cm) 0.1 Area: (cm) 1.649 Volume: (cm) 0.165 % Reduction in Area: % Reduction in Volume: Epithelialization: None Tunneling: No Undermining: No Wound Description Classification: Partial Thickness Wound Margin: Thickened Exudate Amount: Medium Exudate Type: Serosanguineous Exudate Color: red, brown Foul Odor After Cleansing: No Wound Bed Granulation Amount: Large (67-100%) Exposed Structure Granulation Quality: Red, Pink Fascia Exposed: No Necrotic Amount: None Present (0%) Fat Layer Exposed: No Tendon Exposed: No Goodlin, Ercie L. ([id number]) Muscle Exposed: No Joint Exposed: No Bone Exposed: No Limited to Skin Breakdown Periwound Skin Texture Texture Color No Abnormalities Noted: No No Abnormalities Noted: No Localized Edema: Yes Temperature / Pain Moisture Temperature: No Abnormality No Abnormalities Noted: No Tenderness on Palpation: Yes Moist: Yes Wound Preparation Ulcer Cleansing: Rinsed/Irrigated with Saline Treatment Notes Wound #2 (Left, Superior Gluteus) 1. Cleansed with: Clean wound with Normal Saline 4. Dressing Applied: Iodoform packing Gauze 5. Secondary Dressing Applied Dry Gauze 7. Secured with Recruitment consultant) Signed: 03/27/2015 5:36:25 PM By: Michaela Wilson Entered By: Michaela Wilson on 03/27/2015 15:21:23 Michaela Wilson ([id number]) -------------------------------------------------------------------------------- Vitals Details Patient Name: Michaela Hinders L. Date of Service: 03/27/2015 2:15 PM Medical Record Number: [id number] Patient Account Number: [id number] Date  of Birth/Sex: [birth year] (50 y.o. Female) Treating RN: Michaela Wilson Primary Wilson Physician: Michaela Wilson Other Clinician: Referring Physician: Cammie Wilson Treating Physician/Extender: Michaela Wilson in Treatment: 0 Vital Signs Time Taken: 14:57 Temperature (F): 97.7 Height (in): 62 Pulse (bpm): 103 Source: Stated Respiratory Rate (breaths/min): 20 Weight (lbs): 146 Blood Pressure (mmHg): 151/107 Source: Stated Reference Range: 80 - 120 mg / dl Body Mass Index (BMI): 26.7 Electronic Signature(s) Signed: 03/27/2015 5:36:25 PM By: Michaela Wilson Entered By: Michaela Wilson on 03/27/2015 14:59:34

## 2015-03-28 NOTE — Progress Notes (Signed)
MANILA, CARREJO ([id number]) Visit Report for 03/27/2015 Abuse/Suicide Risk Screen Details Patient Name: Michaela Wilson, [name] Date of Service: 03/27/2015 2:15 PM Medical Record Number: [id number] Patient Account Number: [id number] Date of Birth/Sex: 07/16/1965 (50 y.o. Female) Treating RN: Carolyne Fiscal, Debi Primary Care Physician: Cammie Sickle Other Clinician: Referring Physician: Cammie Sickle Treating Physician/Extender: Frann Rider in Treatment: 0 Abuse/Suicide Risk Screen Items Answer ABUSE/SUICIDE RISK SCREEN: Has anyone close to you tried to hurt or harm you recentlyo No Do you feel uncomfortable with anyone in your familyo No Has anyone forced you do things that you didnot want to doo No Do you have any thoughts of harming yourselfo No Patient displays signs or symptoms of abuse and/or neglect. No Electronic Signature(s) Signed: 03/27/2015 5:36:25 PM By: Alric Quan Entered By: Alric Quan on 03/27/2015 15:08:57 [name] ([id number]) -------------------------------------------------------------------------------- Activities of Daily Living Details Patient Name: Michaela Wilson, [name] Date of Service: 03/27/2015 2:15 PM Medical Record Number: [id number] Patient Account Number: [id number] Date of Birth/Sex: Jul 23, 1965 (50 y.o. Female) Treating RN: Carolyne Fiscal, Debi Primary Care Physician: Cammie Sickle Other Clinician: Referring Physician: Cammie Sickle Treating Physician/Extender: Frann Rider in Treatment: 0 Activities of Daily Living Items Answer Activities of Daily Living (Please select one for each item) Drive Automobile Completely Able Take Medications Completely Able Use Telephone Completely Able Care for Appearance Completely Able Use Toilet Completely Able Bath / Shower Completely Able Dress Self Completely Able Feed Self Completely Able Walk Completely Able Get In / Out Bed Completely Able Housework Completely Able Prepare Meals  Completely Able Handle Money Completely Able Shop for Self Completely Able Electronic Signature(s) Signed: 03/27/2015 5:36:25 PM By: Alric Quan Entered By: Alric Quan on 03/27/2015 15:09:25 [name] ([id number]) -------------------------------------------------------------------------------- Education Assessment Details Patient Name: Michaela Wilson, [name] Date of Service: 03/27/2015 2:15 PM Medical Record Number: [id number] Patient Account Number: [id number] Date of Birth/Sex: 1965-09-30 (50 y.o. Female) Treating RN: Carolyne Fiscal, Debi Primary Care Physician: Cammie Sickle Other Clinician: Referring Physician: Cammie Sickle Treating Physician/Extender: Frann Rider in Treatment: 0 Primary Learner Assessed: Patient Learning Preferences/Education Level/Primary Language Learning Preference: Explanation, Printed Material Highest Education Level: College or Above Preferred Language: English Cognitive Barrier Assessment/Beliefs Language Barrier: No Translator Needed: No Memory Deficit: No Emotional Barrier: No Cultural/Religious Beliefs Affecting Medical No Care: Physical Barrier Assessment Impaired Vision: Yes Glasses, reading Impaired Hearing: No Decreased Hand dexterity: No Knowledge/Comprehension Assessment Knowledge Level: High Comprehension Level: High Ability to understand written High instructions: Ability to understand verbal High instructions: Motivation Assessment Anxiety Level: Calm Cooperation: Cooperative Education Importance: Acknowledges Need Interest in Health Problems: Asks Questions Perception: Coherent Willingness to Engage in Self- High Management Activities: Readiness to Engage in Self- High Management Activities: Electronic Signature(s) [name], [name] ([id number]) Signed: 03/27/2015 5:36:25 PM By: Alric Quan Entered By: Alric Quan on 03/27/2015 15:09:55 [name]  ([id number]) -------------------------------------------------------------------------------- Fall Risk Assessment Details Patient Name: Michaela Wilson, [name] Date of Service: 03/27/2015 2:15 PM Medical Record Number: [id number] Patient Account Number: [id number] Date of Birth/Sex: [birth year] (50 y.o. Female) Treating RN: Carolyne Fiscal, Debi Primary Care Physician: Cammie Sickle Other Clinician: Referring Physician: Cammie Sickle Treating Physician/Extender: Frann Rider in Treatment: 0 Fall Risk Assessment Items Have you had 2 or more falls in the last 12 monthso 0 No Have you had any fall that resulted in injury in the last 12 monthso 0 No FALL RISK ASSESSMENT: History of falling - immediate or within 3 months 0 No Secondary diagnosis 0 No Ambulatory aid None/bed rest/wheelchair/nurse 0 No Crutches/cane/walker  0 No Furniture 0 No IV Access/Saline Lock 0 No Gait/Training Normal/bed rest/immobile 0 No Weak 0 No Impaired 0 No Mental Status Oriented to own ability 0 No Electronic Signature(s) Signed: 03/27/2015 5:36:25 PM By: Alric Quan Entered By: Alric Quan on 03/27/2015 15:10:05 [name], [name] ([id number]) -------------------------------------------------------------------------------- Foot Assessment Details Patient Name: Michaela Wilson, [name] Date of Service: 03/27/2015 2:15 PM Medical Record Number: [id number] Patient Account Number: [id number] Date of Birth/Sex: July 02, 1965 (50 y.o. Female) Treating RN: Carolyne Fiscal, Debi Primary Care Physician: Cammie Sickle Other Clinician: Referring Physician: Cammie Sickle Treating Physician/Extender: Frann Rider in Treatment: 0 Foot Assessment Items Site Locations + = Sensation present, - = Sensation absent, C = Callus, U = Ulcer R = Redness, W = Warmth, M = Maceration, PU = Pre-ulcerative lesion F = Fissure, S = Swelling, D = Dryness Assessment Right: Left: Other Deformity: No No Prior Foot Ulcer: No  No Prior Amputation: No No Charcot Joint: No No Ambulatory Status: Ambulatory Without Help Gait: Steady Electronic Signature(s) Signed: 03/27/2015 5:36:25 PM By: Alric Quan Entered By: Alric Quan on 03/27/2015 15:10:51 [name], [name] ([id number]) -------------------------------------------------------------------------------- Nutrition Risk Assessment Details Patient Name: Michaela Wilson, [name] Date of Service: 03/27/2015 2:15 PM Medical Record Number: [id number] Patient Account Number: [id number] Date of Birth/Sex: 1965-05-24 (50 y.o. Female) Treating RN: Carolyne Fiscal, Debi Primary Care Physician: Cammie Sickle Other Clinician: Referring Physician: Cammie Sickle Treating Physician/Extender: Frann Rider in Treatment: 0 Height (in): 62 Weight (lbs): 146 Body Mass Index (BMI): 26.7 Nutrition Risk Assessment Items NUTRITION RISK SCREEN: I have an illness or condition that made me change the kind and/or 0 No amount of food I eat I eat fewer than two meals per day 3 Yes I eat few fruits and vegetables, or milk products 0 No I have three or more drinks of beer, liquor or wine almost every day 0 No I have tooth or mouth problems that make it hard for me to eat 0 No I don't always have enough money to buy the food I need 0 No I eat alone most of the time 0 No I take three or more different prescribed or over-the-counter drugs a 0 No day Without wanting to, I have lost or gained 10 pounds in the last six 0 No months I am not always physically able to shop, cook and/or feed myself 0 No Nutrition Protocols Good Risk Protocol Moderate Risk Protocol Electronic Signature(s) Signed: 03/27/2015 5:36:25 PM By: Alric Quan Entered By: Alric Quan on 03/27/2015 15:10:20

## 2015-04-03 ENCOUNTER — Encounter: Payer: 59 | Admitting: Surgery

## 2015-04-03 DIAGNOSIS — L0231 Cutaneous abscess of buttock: Secondary | ICD-10-CM | POA: Diagnosis not present

## 2015-04-04 NOTE — Progress Notes (Signed)
BRIZZA, ZENISEK ([id number]) Visit Report for 04/03/2015 Arrival Information Details Patient Name: Michaela Wilson, Michaela Wilson. Date of Service: 04/03/2015 2:15 PM Medical Record Number: [id number] Patient Account Number: [id number] Date of Birth/Sex: 1965/11/01 (50 y.o. Female) Treating RN: Carolyne Fiscal, Debi Primary Care Physician: Cammie Sickle Other Clinician: Referring Physician: Cammie Sickle Treating Physician/Extender: Frann Rider in Treatment: 1 Visit Information History Since Last Visit All ordered tests and consults were completed: No Patient Arrived: Ambulatory Added or deleted any medications: No Arrival Time: 14:33 Any new allergies or adverse reactions: No Accompanied By: self Had a fall or experienced change in No Transfer Assistance: None activities of daily living that may affect Patient Identification Verified: Yes risk of falls: Secondary Verification Process Yes Signs or symptoms of abuse/neglect since last No Completed: visito Patient Requires Transmission-Based No Hospitalized since last visit: No Precautions: Pain Present Now: No Patient Has Alerts: No Electronic Signature(s) Signed: 04/03/2015 5:07:36 PM By: Alric Quan Entered By: Alric Quan on 04/03/2015 14:33:45 Cervantes, [name] ([id number]) -------------------------------------------------------------------------------- Clinic Level of Care Assessment Details Patient Name: Alease Medina, Sona L. Date of Service: 04/03/2015 2:15 PM Medical Record Number: [id number] Patient Account Number: [id number] Date of Birth/Sex: 1965-08-26 (50 y.o. Female) Treating RN: Carolyne Fiscal, Debi Primary Care Physician: Cammie Sickle Other Clinician: Referring Physician: Cammie Sickle Treating Physician/Extender: Frann Rider in Treatment: 1 Clinic Level of Care Assessment Items TOOL 4 Quantity Score []  - Use when only an EandM is performed on FOLLOW-UP visit 0 ASSESSMENTS - Nursing Assessment /  Reassessment []  - Reassessment of Co-morbidities (includes updates in patient status) 0 X - Reassessment of Adherence to Treatment Plan 1 5 ASSESSMENTS - Wound and Skin Assessment / Reassessment []  - Simple Wound Assessment / Reassessment - one wound 0 X - Complex Wound Assessment / Reassessment - multiple wounds 2 5 []  - Dermatologic / Skin Assessment (not related to wound area) 0 ASSESSMENTS - Focused Assessment []  - Circumferential Edema Measurements - multi extremities 0 []  - Nutritional Assessment / Counseling / Intervention 0 []  - Lower Extremity Assessment (monofilament, tuning fork, pulses) 0 []  - Peripheral Arterial Disease Assessment (using hand held doppler) 0 ASSESSMENTS - Ostomy and/or Continence Assessment and Care []  - Incontinence Assessment and Management 0 []  - Ostomy Care Assessment and Management (repouching, etc.) 0 PROCESS - Coordination of Care X - Simple Patient / Family Education for ongoing care 1 15 []  - Complex (extensive) Patient / Family Education for ongoing care 0 []  - Staff obtains Programmer, systems, Records, Test Results / Process Orders 0 []  - Staff telephones HHA, Nursing Homes / Clarify orders / etc 0 []  - Routine Transfer to another Facility (non-emergent condition) 0 Brockel, Kamelia L. ([id number]) []  - Routine Hospital Admission (non-emergent condition) 0 []  - New Admissions / Biomedical engineer / Ordering NPWT, Apligraf, etc. 0 []  - Emergency Hospital Admission (emergent condition) 0 X - Simple Discharge Coordination 1 10 []  - Complex (extensive) Discharge Coordination 0 PROCESS - Special Needs []  - Pediatric / Minor Patient Management 0 []  - Isolation Patient Management 0 []  - Hearing / Language / Visual special needs 0 []  - Assessment of Community assistance (transportation, D/C planning, etc.) 0 []  - Additional assistance / Altered mentation 0 []  - Support Surface(s) Assessment (bed, cushion, seat, etc.) 0 INTERVENTIONS - Wound Cleansing /  Measurement []  - Simple Wound Cleansing - one wound 0 X - Complex Wound Cleansing - multiple wounds 2 5 X - Wound Imaging (photographs - any number of wounds) 1 5 []  - Wound  Tracing (instead of photographs) 0 []  - Simple Wound Measurement - one wound 0 X - Complex Wound Measurement - multiple wounds 2 5 INTERVENTIONS - Wound Dressings X - Small Wound Dressing one or multiple wounds 2 10 []  - Medium Wound Dressing one or multiple wounds 0 []  - Large Wound Dressing one or multiple wounds 0 X - Application of Medications - topical 1 5 []  - Application of Medications - injection 0 INTERVENTIONS - Miscellaneous []  - External ear exam 0 Truby, Fendi L. ([id number]) []  - Specimen Collection (cultures, biopsies, blood, body fluids, etc.) 0 []  - Specimen(s) / Culture(s) sent or taken to Lab for analysis 0 []  - Patient Transfer (multiple staff / Harrel Lemon Lift / Similar devices) 0 []  - Simple Staple / Suture removal (25 or less) 0 []  - Complex Staple / Suture removal (26 or more) 0 []  - Hypo / Hyperglycemic Management (close monitor of Blood Glucose) 0 []  - Ankle / Brachial Index (ABI) - do not check if billed separately 0 X - Vital Signs 1 5 Has the patient been seen at the hospital within the last three years: Yes Total Score: 95 Level Of Care: New/Established - Level 3 Electronic Signature(s) Signed: 04/03/2015 5:07:36 PM By: Alric Quan Entered By: Alric Quan on 04/03/2015 16:36:38 Treiber, [name] ([id number]) -------------------------------------------------------------------------------- Encounter Discharge Information Details Patient Name: Alease Medina, Teyah L. Date of Service: 04/03/2015 2:15 PM Medical Record Number: [id number] Patient Account Number: [id number] Date of Birth/Sex: 11-02-1965 (50 y.o. Female) Treating RN: Carolyne Fiscal, Debi Primary Care Physician: Cammie Sickle Other Clinician: Referring Physician: Cammie Sickle Treating Physician/Extender: Frann Rider  in Treatment: 1 Encounter Discharge Information Items Discharge Pain Level: 0 Discharge Condition: Stable Ambulatory Status: Ambulatory Discharge Destination: Home Transportation: Private Auto Accompanied By: self Schedule Follow-up Appointment: No Medication Reconciliation completed and provided to Patient/Care Yes Shanira Tine: Provided on Clinical Summary of Care: 04/03/2015 Form Type Recipient Paper Patient TL Electronic Signature(s) Signed: 04/03/2015 5:07:36 PM By: Alric Quan Previous Signature: 04/03/2015 3:03:37 PM Version By: Ruthine Dose Entered By: Alric Quan on 04/03/2015 15:07:05 Gilreath, Brigida L. ([id number]) -------------------------------------------------------------------------------- Lower Extremity Assessment Details Patient Name: Maietta, Kesa L. Date of Service: 04/03/2015 2:15 PM Medical Record Number: [id number] Patient Account Number: [id number] Date of Birth/Sex: 1965-05-25 (50 y.o. Female) Treating RN: Carolyne Fiscal, Debi Primary Care Physician: Cammie Sickle Other Clinician: Referring Physician: Cammie Sickle Treating Physician/Extender: Frann Rider in Treatment: 1 Electronic Signature(s) Signed: 04/03/2015 5:07:36 PM By: Alric Quan Entered By: Alric Quan on 04/03/2015 14:36:12 Tomey, Woodbine ([id number]) -------------------------------------------------------------------------------- Multi Wound Chart Details Patient Name: Hunnicutt, Genise L. Date of Service: 04/03/2015 2:15 PM Medical Record Number: [id number] Patient Account Number: [id number] Date of Birth/Sex: 05-22-1965 (50 y.o. Female) Treating RN: Carolyne Fiscal, Debi Primary Care Physician: Cammie Sickle Other Clinician: Referring Physician: Cammie Sickle Treating Physician/Extender: Frann Rider in Treatment: 1 Vital Signs Height(in): 62 Pulse(bpm): 86 Weight(lbs): 146 Blood Pressure 143/98 (mmHg): Body Mass Index(BMI): 27 Temperature(F):  98.6 Respiratory Rate 20 (breaths/min): Photos: [1:No Photos] [2:No Photos] [N/A:N/A] Wound Location: [1:Left Gluteus - Superior] [2:Left Gluteus - Superior] [N/A:N/A] Wounding Event: [1:Surgical Injury] [2:Surgical Injury] [N/A:N/A] Primary Etiology: [1:Open Surgical Wound] [2:Open Surgical Wound] [N/A:N/A] Date Acquired: [1:03/09/2015] [2:03/09/2015] [N/A:N/A] Weeks of Treatment: [1:1] [2:1] [N/A:N/A] Wound Status: [1:Open] [2:Open] [N/A:N/A] Measurements L x W x D 1.6x0.4x1.2 [2:0.5x0.3x0.6] [N/A:N/A] (cm) Area (cm) : [1:0.503] [2:0.118] [N/A:N/A] Volume (cm) : [1:0.603] [2:0.071] [N/A:N/A] % Reduction in Area: [1:41.80%] [2:92.80%] [N/A:N/A] % Reduction in Volume: -601.20% [2:57.00%] [N/A:N/A] Classification: [1:Partial Thickness] [2:Partial Thickness] [N/A:N/A]  Exudate Amount: [1:Medium] [2:Medium] [N/A:N/A] Exudate Type: [1:Serosanguineous] [2:Serosanguineous] [N/A:N/A] Exudate Color: [1:red, brown] [2:red, brown] [N/A:N/A] Wound Margin: [1:Flat and Intact] [2:Thickened] [N/A:N/A] Granulation Amount: [1:Large (67-100%)] [2:Large (67-100%)] [N/A:N/A] Granulation Quality: [1:Red, Pink] [2:Red, Pink] [N/A:N/A] Necrotic Amount: [1:None Present (0%)] [2:None Present (0%)] [N/A:N/A] Exposed Structures: [1:Fascia: No Fat: No Tendon: No Muscle: No Joint: No Bone: No Limited to Skin Breakdown] [2:Fascia: No Fat: No Tendon: No Muscle: No Joint: No Bone: No Limited to Skin Breakdown] [N/A:N/A] Epithelialization: [1:None] [2:None] [N/A:N/A] Periwound Skin Texture: No Abnormalities Noted Edema: Yes N/A Periwound Skin Moist: Yes Moist: Yes N/A Moisture: Periwound Skin Color: No Abnormalities Noted No Abnormalities Noted N/A Temperature: No Abnormality No Abnormality N/A Tenderness on Yes Yes N/A Palpation: Wound Preparation: Ulcer Cleansing: Ulcer Cleansing: N/A Rinsed/Irrigated with Rinsed/Irrigated with Saline Saline Topical Anesthetic Topical Anesthetic Applied: Other:  lidocaine Applied: Other: 4% lidocaine4% Treatment Notes Electronic Signature(s) Signed: 04/03/2015 5:07:36 PM By: Alric Quan Entered By: Alric Quan on 04/03/2015 14:46:27 [name] ([id number]) -------------------------------------------------------------------------------- [place] Details Patient Name: [name] Date of Service: 04/03/2015 2:15 PM Medical Record Number: [id number] Patient Account Number: [id number] Date of Birth/Sex: 1965-07-17 (50 y.o. Female) Treating RN: Carolyne Fiscal, Debi Primary Care Physician: Cammie Sickle Other Clinician: Referring Physician: Cammie Sickle Treating Physician/Extender: Frann Rider in Treatment: 1 Active Inactive Abuse / Safety / Falls / Self Care Management Nursing Diagnoses: Potential for falls Goals: Patient will remain injury free Date Initiated: 03/28/2015 Goal Status: Active Interventions: Assess fall risk on admission and as needed Notes: Orientation to the Wound Care Program Nursing Diagnoses: Knowledge deficit related to the wound healing center program Goals: Patient/caregiver will verbalize understanding of the [place] Program Date Initiated: 03/28/2015 Goal Status: Active Interventions: Provide education on orientation to the wound center Notes: Pain, Acute or Chronic Nursing Diagnoses: Pain, acute or chronic: actual or potential Potential alteration in comfort, pain Goals: Elling, Alauna L. ([id number]) Patient will verbalize adequate pain control and receive pain control interventions during procedures as needed Date Initiated: 03/28/2015 Goal Status: Active Interventions: Assess comfort goal upon admission Notes: Wound/Skin Impairment Nursing Diagnoses: Impaired tissue integrity Goals: Ulcer/skin breakdown will have a volume reduction of 30% by week 4 Date Initiated: 03/28/2015 Goal Status: Active Ulcer/skin breakdown will have a volume  reduction of 50% by week 8 Date Initiated: 03/28/2015 Goal Status: Active Ulcer/skin breakdown will have a volume reduction of 80% by week 12 Date Initiated: 03/28/2015 Goal Status: Active Interventions: Assess patient/caregiver ability to obtain necessary supplies Assess patient/caregiver ability to perform ulcer/skin care regimen upon admission and as needed Notes: Electronic Signature(s) Signed: 04/03/2015 5:07:36 PM By: Alric Quan Entered By: Alric Quan on 04/03/2015 14:46:19 Dejoseph, Jaylie L. ([id number]) -------------------------------------------------------------------------------- Pain Assessment Details Patient Name: Alease Medina, Shirrell L. Date of Service: 04/03/2015 2:15 PM Medical Record Number: [id number] Patient Account Number: [id number] Date of Birth/Sex: 08-17-1965 (50 y.o. Female) Treating RN: Carolyne Fiscal, Debi Primary Care Physician: Cammie Sickle Other Clinician: Referring Physician: Cammie Sickle Treating Physician/Extender: Frann Rider in Treatment: 1 Active Problems Location of Pain Severity and Description of Pain Patient Has Paino No Site Locations Pain Management and Medication Current Pain Management: Electronic Signature(s) Signed: 04/03/2015 5:07:36 PM By: Alric Quan Entered By: Alric Quan on 04/03/2015 14:33:54 [name] ([id number]) -------------------------------------------------------------------------------- Patient/Caregiver Education Details Patient Name: Alease Medina, Aveleen L. Date of Service: 04/03/2015 2:15 PM Medical Record Number: [id number] Patient Account Number: [id number] Date of Birth/Gender: February 20, 1966 (50 y.o. Female) Treating RN: Ahmed Prima Primary Care Physician: Cammie Sickle Other  Clinician: Referring Physician: Cammie Sickle Treating Physician/Extender: Frann Rider in Treatment: 1 Education Assessment Education Provided To: Patient Education Topics Provided Wound/Skin  Impairment: Handouts: Other: change dressing as ordered Methods: Demonstration, Explain/Verbal Responses: State content correctly Electronic Signature(s) Signed: 04/03/2015 5:07:36 PM By: Alric Quan Entered By: Alric Quan on 04/03/2015 15:07:23 Reichard, [name] ([id number]) -------------------------------------------------------------------------------- Wound Assessment Details Patient Name: Rego, Areen L. Date of Service: 04/03/2015 2:15 PM Medical Record Number: [id number] Patient Account Number: [id number] Date of Birth/Sex: 20-Nov-1965 (50 y.o. Female) Treating RN: Carolyne Fiscal, Debi Primary Care Physician: Cammie Sickle Other Clinician: Referring Physician: Cammie Sickle Treating Physician/Extender: Frann Rider in Treatment: 1 Wound Status Wound Number: 1 Primary Etiology: Open Surgical Wound Wound Location: Left Gluteus - Superior Wound Status: Open Wounding Event: Surgical Injury Date Acquired: 03/09/2015 Weeks Of Treatment: 1 Clustered Wound: No Photos Photo Uploaded By: Alric Quan on 04/03/2015 16:42:10 Wound Measurements Length: (cm) 1.6 Width: (cm) 0.4 Depth: (cm) 1.2 Area: (cm) 0.503 Volume: (cm) 0.603 % Reduction in Area: 41.8% % Reduction in Volume: -601.2% Epithelialization: None Tunneling: No Undermining: No Wound Description Classification: Partial Thickness Wound Margin: Flat and Intact Exudate Amount: Medium Exudate Type: Serosanguineous Exudate Color: red, brown Foul Odor After Cleansing: No Wound Bed Granulation Amount: Large (67-100%) Exposed Structure Granulation Quality: Red, Pink Fascia Exposed: No Necrotic Amount: None Present (0%) Fat Layer Exposed: No Tendon Exposed: No Grasse, Elodia L. ([id number]) Muscle Exposed: No Joint Exposed: No Bone Exposed: No Limited to Skin Breakdown Periwound Skin Texture Texture Color No Abnormalities Noted: No No Abnormalities Noted: No Moisture Temperature / Pain No  Abnormalities Noted: No Temperature: No Abnormality Moist: Yes Tenderness on Palpation: Yes Wound Preparation Ulcer Cleansing: Rinsed/Irrigated with Saline Topical Anesthetic Applied: Other: lidocaine 4%, Treatment Notes Wound #1 (Left, Superior Gluteus) 1. Cleansed with: Clean wound with Normal Saline 2. Anesthetic Topical Lidocaine 4% cream to wound bed prior to debridement 4. Dressing Applied: Iodoform packing Gauze 5. Secondary Dressing Applied Dry Gauze 7. Secured with Recruitment consultant) Signed: 04/03/2015 5:07:36 PM By: Alric Quan Entered By: Alric Quan on 04/03/2015 14:42:32 [name] ([id number]) -------------------------------------------------------------------------------- Wound Assessment Details Patient Name: Vanderkolk, Delayna L. Date of Service: 04/03/2015 2:15 PM Medical Record Number: [id number] Patient Account Number: [id number] Date of Birth/Sex: 1965/03/22 (50 y.o. Female) Treating RN: Carolyne Fiscal, Debi Primary Care Physician: Cammie Sickle Other Clinician: Referring Physician: Cammie Sickle Treating Physician/Extender: Frann Rider in Treatment: 1 Wound Status Wound Number: 2 Primary Etiology: Open Surgical Wound Wound Location: Left Gluteus - Superior Wound Status: Open Wounding Event: Surgical Injury Date Acquired: 03/09/2015 Weeks Of Treatment: 1 Clustered Wound: No Photos Photo Uploaded By: Alric Quan on 04/03/2015 16:42:10 Wound Measurements Length: (cm) 0.5 Width: (cm) 0.3 Depth: (cm) 0.6 Area: (cm) 0.118 Volume: (cm) 0.071 % Reduction in Area: 92.8% % Reduction in Volume: 57% Epithelialization: None Tunneling: No Undermining: No Wound Description Classification: Partial Thickness Wound Margin: Thickened Exudate Amount: Medium Exudate Type: Serosanguineous Exudate Color: red, brown Foul Odor After Cleansing: No Wound Bed Granulation Amount: Large (67-100%) Exposed Structure Granulation  Quality: Red, Pink Fascia Exposed: No Necrotic Amount: None Present (0%) Fat Layer Exposed: No Tendon Exposed: No Aina, Donicia L. ([id number]) Muscle Exposed: No Joint Exposed: No Bone Exposed: No Limited to Skin Breakdown Periwound Skin Texture Texture Color No Abnormalities Noted: No No Abnormalities Noted: No Localized Edema: Yes Temperature / Pain Moisture Temperature: No Abnormality No Abnormalities Noted: No Tenderness on Palpation: Yes Moist: Yes Wound Preparation Ulcer Cleansing: Rinsed/Irrigated with Saline Topical Anesthetic  Applied: Other: lidocaine4%, Treatment Notes Wound #2 (Left, Superior Gluteus) 1. Cleansed with: Clean wound with Normal Saline 2. Anesthetic Topical Lidocaine 4% cream to wound bed prior to debridement 4. Dressing Applied: Iodoform packing Gauze 5. Secondary Dressing Applied Dry Gauze 7. Secured with Recruitment consultant) Signed: 04/03/2015 5:07:36 PM By: Alric Quan Entered By: Alric Quan on 04/03/2015 14:44:15 [name] ([id number]) -------------------------------------------------------------------------------- Vitals Details Patient Name: [name] Date of Service: 04/03/2015 2:15 PM Medical Record Number: [id number] Patient Account Number: [id number] Date of Birth/Sex: 03-May-1965 (50 y.o. Female) Treating RN: Carolyne Fiscal, Debi Primary Care Physician: Cammie Sickle Other Clinician: Referring Physician: Cammie Sickle Treating Physician/Extender: Frann Rider in Treatment: 1 Vital Signs Time Taken: 14:34 Temperature (F): 98.6 Height (in): 62 Pulse (bpm): 86 Weight (lbs): 146 Respiratory Rate (breaths/min): 20 Body Mass Index (BMI): 26.7 Blood Pressure (mmHg): 143/98 Reference Range: 80 - 120 mg / dl Electronic Signature(s) Signed: 04/03/2015 5:07:36 PM By: Alric Quan Entered By: Alric Quan on 04/03/2015 14:35:24

## 2015-04-04 NOTE — Progress Notes (Signed)
BLESSING, ASKAR ([id number]) Visit Report for 04/03/2015 Chief Complaint Document Details Patient Name: Michaela Wilson, [name] Date of Service: 04/03/2015 2:15 PM Medical Record Number: [id number] Patient Account Number: [id number] Date of Birth/Sex: Jul 27, 1965 (50 y.o. Female) Treating RN: Carolyne Fiscal, Debi Primary Care Physician: Cammie Sickle Other Clinician: Referring Physician: Cammie Sickle Treating Physician/Extender: Frann Rider in Treatment: 1 Information Obtained from: Patient Chief Complaint Patient presents to the wound care center for a consult due non healing wound to the left buttock which she's had for about 3 weeks now. Electronic Signature(s) Signed: 04/03/2015 3:23:20 PM By: Christin Fudge MD, FACS Entered By: Christin Fudge on 04/03/2015 15:23:20 [name] ([id number]) -------------------------------------------------------------------------------- HPI Details Patient Name: Michaela Hinders L. Date of Service: 04/03/2015 2:15 PM Medical Record Number: [id number] Patient Account Number: [id number] Date of Birth/Sex: January 15, 1966 (50 y.o. Female) Treating RN: Carolyne Fiscal, Debi Primary Care Physician: Cammie Sickle Other Clinician: Referring Physician: Cammie Sickle Treating Physician/Extender: Frann Rider in Treatment: 1 History of Present Illness Location: left gluteal IandD site with Penrose drain in situ Quality: Patient reports experiencing a dull pain to affected area(s). Severity: Patient states wound (s) are getting better. Duration: Patient has had the wound for < 3 weeks prior to presenting for treatment Timing: Pain in wound is constant (hurts all the time) Context: The wound occurred when the patient had an abscess to the left gluteal area which was IandD done in the ER and she was admitted for IV antibiotics Modifying Factors: Other treatment(s) tried include:IV and oral antibiotics and IandD of an abscess Associated Signs and Symptoms:  Patient reports having difficulty sitting for long periods. HPI Description: 50 year old patient who had an abscess on the left gluteal area which was indeed in the ER on 03/07/2015. She had an element of sepsis and hence was admitted to the hospital and given vancomycin and Zosyn. she was advised to take Augmentin and doxycycline for 10 days after discharge. She has nothing significant on past medical history and past surgical history she's a tubal ligation and cholecystectomy. Electronic Signature(s) Signed: 04/03/2015 3:23:26 PM By: Christin Fudge MD, FACS Entered By: Christin Fudge on 04/03/2015 15:23:26 [name] ([id number]) -------------------------------------------------------------------------------- Physical Exam Details Patient Name: [name], [name] Date of Service: 04/03/2015 2:15 PM Medical Record Number: [id number] Patient Account Number: [id number] Date of Birth/Sex: 03/17/1966 (50 y.o. Female) Treating RN: Carolyne Fiscal, Debi Primary Care Physician: Cammie Sickle Other Clinician: Referring Physician: Cammie Sickle Treating Physician/Extender: Frann Rider in Treatment: 1 Constitutional . Pulse regular. Respirations normal and unlabored. Afebrile. . Eyes Nonicteric. Reactive to light. Ears, Nose, Mouth, and Throat Lips, teeth, and gums WNL.Marland Kitchen Moist mucosa without lesions. Neck supple and nontender. No palpable supraclavicular or cervical adenopathy. Normal sized without goiter. Respiratory WNL. No retractions.. Cardiovascular Pedal Pulses WNL. No clubbing, cyanosis or edema. Lymphatic No adneopathy. No adenopathy. No adenopathy. Musculoskeletal Adexa without tenderness or enlargement.. Digits and nails w/o clubbing, cyanosis, infection, petechiae, ischemia, or inflammatory conditions.. Integumentary (Hair, Skin) No suspicious lesions. No crepitus or fluctuance. No peri-wound warmth or erythema. No masses.Marland Kitchen Psychiatric Judgement and insight Intact.. No  evidence of depression, anxiety, or agitation.. Notes the 2 drain sites on her left gluteal region probing down only to about 1 cm and there is no evidence of a seroma or abscess cavity. Electronic Signature(s) Signed: 04/03/2015 3:24:09 PM By: Christin Fudge MD, FACS Entered By: Christin Fudge on 04/03/2015 15:24:09 [name] ([id number]) -------------------------------------------------------------------------------- Physician Orders Details Patient Name: Michaela Hinders L. Date of Service: 04/03/2015 2:15  PM Medical Record Number: [id number] Patient Account Number: [id number] Date of Birth/Sex: 07-26-1965 (50 y.o. Female) Treating RN: Carolyne Fiscal, Debi Primary Care Physician: Cammie Sickle Other Clinician: Referring Physician: Cammie Sickle Treating Physician/Extender: Frann Rider in Treatment: 1 Verbal / Phone Orders: Yes Clinician: Pinkerton, Debi Read Back and Verified: Yes Diagnosis Coding Wound Cleansing Wound #1 Left,Superior Gluteus o Clean wound with Normal Saline. Wound #2 Left,Superior Gluteus o Clean wound with Normal Saline. Anesthetic Wound #1 Left,Superior Gluteus o Topical Lidocaine 4% cream applied to wound bed prior to debridement Wound #2 Left,Superior Gluteus o Topical Lidocaine 4% cream applied to wound bed prior to debridement Primary Wound Dressing Wound #1 Left,Superior Gluteus o Iodoform packing Gauze - 1/4 inch o Dry Gauze Wound #2 Left,Superior Gluteus o Iodoform packing Gauze - 1/4 inch o Dry Gauze Dressing Change Frequency Wound #1 Left,Superior Gluteus o Change dressing every day. Wound #2 Left,Superior Gluteus o Change dressing every day. Follow-up Appointments Wound #1 Left,Superior Gluteus o Return Appointment in 1 week. Wound #2 Left,Superior Gluteus o Return Appointment in 1 week. [place], BRUCKNER ([id number]) Electronic Signature(s) Signed: 04/03/2015 3:35:32 PM By: Christin Fudge MD, FACS Signed:  04/03/2015 5:07:36 PM By: Alric Quan Entered By: Alric Quan on 04/03/2015 15:06:12 [name] ([id number]) -------------------------------------------------------------------------------- Problem List Details Patient Name: Michaela Wilson, [name] Date of Service: 04/03/2015 2:15 PM Medical Record Number: [id number] Patient Account Number: [id number] Date of Birth/Sex: [birth year] (50 y.o. Female) Treating RN: Carolyne Fiscal, Debi Primary Care Physician: Cammie Sickle Other Clinician: Referring Physician: Cammie Sickle Treating Physician/Extender: Frann Rider in Treatment: 1 Active Problems ICD-10 Encounter Code Description Active Date Diagnosis L02.31 Cutaneous abscess of buttock 03/27/2015 Yes S31.829A Unspecified open wound of left buttock, initial encounter 03/27/2015 Yes Inactive Problems Resolved Problems Electronic Signature(s) Signed: 04/03/2015 3:23:01 PM By: Christin Fudge MD, FACS Entered By: Christin Fudge on 04/03/2015 15:23:00 Dufner, Trayonna L. ([id number]) -------------------------------------------------------------------------------- Progress Note Details Patient Name: Michaela Wilson, [name] Date of Service: 04/03/2015 2:15 PM Medical Record Number: [id number] Patient Account Number: [id number] Date of Birth/Sex: 06/08/1965 (50 y.o. Female) Treating RN: Carolyne Fiscal, Debi Primary Care Physician: Cammie Sickle Other Clinician: Referring Physician: Cammie Sickle Treating Physician/Extender: Frann Rider in Treatment: 1 Subjective Chief Complaint Information obtained from Patient Patient presents to the wound care center for a consult due non healing wound to the left buttock which she's had for about 3 weeks now. History of Present Illness (HPI) The following HPI elements were documented for the patient's wound: Location: left gluteal IandD site with Penrose drain in situ Quality: Patient reports experiencing a dull pain to affected  area(s). Severity: Patient states wound (s) are getting better. Duration: Patient has had the wound for < 3 weeks prior to presenting for treatment Timing: Pain in wound is constant (hurts all the time) Context: The wound occurred when the patient had an abscess to the left gluteal area which was IandD done in the ER and she was admitted for IV antibiotics Modifying Factors: Other treatment(s) tried include:IV and oral antibiotics and IandD of an abscess Associated Signs and Symptoms: Patient reports having difficulty sitting for long periods. 50 year old patient who had an abscess on the left gluteal area which was indeed in the ER on 03/07/2015. She had an element of sepsis and hence was admitted to the hospital and given vancomycin and Zosyn. she was advised to take Augmentin and doxycycline for 10 days after discharge. She has nothing significant on past medical history and past surgical history she's a tubal ligation and  cholecystectomy. Objective Constitutional Pulse regular. Respirations normal and unlabored. Afebrile. Vitals Time Taken: 2:34 PM, Height: 62 in, Weight: 146 lbs, BMI: 26.7, Temperature: 98.6 F, Pulse: 86 bpm, Respiratory Rate: 20 breaths/min, Blood Pressure: 143/98 mmHg. Eyes Bugaj, Shariya L. ([id number]) Nonicteric. Reactive to light. Ears, Nose, Mouth, and Throat Lips, teeth, and gums WNL.Marland Kitchen Moist mucosa without lesions. Neck supple and nontender. No palpable supraclavicular or cervical adenopathy. Normal sized without goiter. Respiratory WNL. No retractions.. Cardiovascular Pedal Pulses WNL. No clubbing, cyanosis or edema. Lymphatic No adneopathy. No adenopathy. No adenopathy. Musculoskeletal Adexa without tenderness or enlargement.. Digits and nails w/o clubbing, cyanosis, infection, petechiae, ischemia, or inflammatory conditions.Marland Kitchen Psychiatric Judgement and insight Intact.. No evidence of depression, anxiety, or agitation.. General Notes: the 2 drain  sites on her left gluteal region probing down only to about 1 cm and there is no evidence of a seroma or abscess cavity. Integumentary (Hair, Skin) No suspicious lesions. No crepitus or fluctuance. No peri-wound warmth or erythema. No masses.. Wound #1 status is Open. Original cause of wound was Surgical Injury. The wound is located on the [place]. The wound measures 1.6cm length x 0.4cm width x 1.2cm depth; 0.503cm^2 area and 0.603cm^3 volume. The wound is limited to skin breakdown. There is no tunneling or undermining noted. There is a medium amount of serosanguineous drainage noted. The wound margin is flat and intact. There is large (67-100%) red, pink granulation within the wound bed. There is no necrotic tissue within the wound bed. The periwound skin appearance exhibited: Moist. Periwound temperature was noted as No Abnormality. The periwound has tenderness on palpation. Wound #2 status is Open. Original cause of wound was Surgical Injury. The wound is located on the [place]. The wound measures 0.5cm length x 0.3cm width x 0.6cm depth; 0.118cm^2 area and 0.071cm^3 volume. The wound is limited to skin breakdown. There is no tunneling or undermining noted. There is a medium amount of serosanguineous drainage noted. The wound margin is thickened. There is large (67-100%) red, pink granulation within the wound bed. There is no necrotic tissue within the wound bed. The periwound skin appearance exhibited: Localized Edema, Moist. Periwound temperature was noted as No Abnormality. The periwound has tenderness on palpation. Assessment Quizon, Sierrah L. ([id number]) Active Problems ICD-10 L02.31 - Cutaneous abscess of buttock S31.829A - Unspecified open wound of left buttock, initial encounter I have asked her to continue packing the drain sites with quarter-inch iodoform gauze and this wound would gradually heal with time. She can come back and see as on a when  necessary basis and is discharged from the wound center. Plan Wound Cleansing: Wound #1 Left,Superior Gluteus: Clean wound with Normal Saline. Wound #2 Left,Superior Gluteus: Clean wound with Normal Saline. Anesthetic: Wound #1 Left,Superior Gluteus: Topical Lidocaine 4% cream applied to wound bed prior to debridement Wound #2 Left,Superior Gluteus: Topical Lidocaine 4% cream applied to wound bed prior to debridement Primary Wound Dressing: Wound #1 Left,Superior Gluteus: Iodoform packing Gauze - 1/4 inch Dry Gauze Wound #2 Left,Superior Gluteus: Iodoform packing Gauze - 1/4 inch Dry Gauze Dressing Change Frequency: Wound #1 Left,Superior Gluteus: Change dressing every day. Wound #2 Left,Superior Gluteus: Change dressing every day. Follow-up Appointments: Wound #1 Left,Superior Gluteus: Return Appointment in 1 week. Wound #2 Left,Superior Gluteus: Return Appointment in 1 week. DAZIYAH, HUY L. ([id number]) I have asked her to continue packing the drain sites with quarter-inch iodoform gauze and this wound would gradually heal with time. She can come back and see as on a  when necessary basis and is discharged from the wound center. Electronic Signature(s) Signed: 04/03/2015 3:24:57 PM By: Christin Fudge MD, FACS Entered By: Christin Fudge on 04/03/2015 15:24:57 [name] ([id number]) -------------------------------------------------------------------------------- SuperBill Details Patient Name: Michaela Wilson, [name] Date of Service: 04/03/2015 Medical Record Number: [id number] Patient Account Number: [id number] Date of Birth/Sex: [birth year] (50 y.o. Female) Treating RN: Carolyne Fiscal, Debi Primary Care Physician: Cammie Sickle Other Clinician: Referring Physician: Cammie Sickle Treating Physician/Extender: Frann Rider in Treatment: 1 Diagnosis Coding ICD-10 Codes Code Description L02.31 Cutaneous abscess of buttock S31.829A Unspecified open wound of left  buttock, initial encounter Facility Procedures CPT4 Code: [id number] Description: 99213 - WOUND CARE VISIT-LEV 3 EST PT Modifier: Quantity: 1 Physician Procedures CPT4 Code: [id number] Description: [id number] - WC PHYS LEVEL 3 - EST PT ICD-10 Description Diagnosis L02.31 Cutaneous abscess of buttock S31.829A Unspecified open wound of left buttock, initial Modifier: encounter Quantity: 1 Electronic Signature(s) Signed: 04/03/2015 4:40:58 PM By: Christin Fudge MD, FACS Signed: 04/03/2015 5:07:36 PM By: Alric Quan Previous Signature: 04/03/2015 3:25:08 PM Version By: Christin Fudge MD, FACS Entered By: Alric Quan on 04/03/2015 16:36:47

## 2015-04-10 ENCOUNTER — Ambulatory Visit: Payer: 59 | Admitting: Surgery

## 2015-06-25 ENCOUNTER — Other Ambulatory Visit (HOSPITAL_COMMUNITY)
Admission: RE | Admit: 2015-06-25 | Discharge: 2015-06-25 | Disposition: A | Discharge status: Home / Self Care | Payer: 59 | Source: Ambulatory Visit | Attending: Internal Medicine | Admitting: Internal Medicine

## 2015-06-25 ENCOUNTER — Encounter: Payer: Self-pay | Admitting: Family Medicine

## 2015-06-25 ENCOUNTER — Ambulatory Visit (INDEPENDENT_AMBULATORY_CARE_PROVIDER_SITE_OTHER): Payer: 59 | Admitting: Family Medicine

## 2015-06-25 DIAGNOSIS — Z01419 Encounter for gynecological examination (general) (routine) without abnormal findings: Secondary | ICD-10-CM | POA: Diagnosis not present

## 2015-06-25 DIAGNOSIS — Z23 Encounter for immunization: Secondary | ICD-10-CM | POA: Diagnosis not present

## 2015-06-25 DIAGNOSIS — R7303 Prediabetes: Secondary | ICD-10-CM | POA: Diagnosis not present

## 2015-06-25 DIAGNOSIS — Z1239 Encounter for other screening for malignant neoplasm of breast: Secondary | ICD-10-CM | POA: Diagnosis not present

## 2015-06-25 DIAGNOSIS — I10 Essential (primary) hypertension: Secondary | ICD-10-CM | POA: Diagnosis not present

## 2015-06-25 DIAGNOSIS — Z124 Encounter for screening for malignant neoplasm of cervix: Secondary | ICD-10-CM

## 2015-06-25 LAB — COMPLETE METABOLIC PANEL WITH GFR
ALK PHOS: 69 U/L (ref 33–115)
ALT: 15 U/L (ref 6–29)
AST: 13 U/L (ref 10–35)
Albumin: 4.1 g/dL (ref 3.6–5.1)
BUN: 12 mg/dL (ref 7–25)
CO2: 26 mmol/L (ref 20–31)
Calcium: 9.4 mg/dL (ref 8.6–10.2)
Chloride: 104 mmol/L (ref 98–110)
Creat: 0.75 mg/dL (ref 0.50–1.10)
GFR, Est African American: >89 mL/min (ref >=60)
GLUCOSE: 103 mg/dL — AB (ref 65–99)
POTASSIUM: 3.5 mmol/L (ref 3.5–5.3)
SODIUM: 139 mmol/L (ref 135–146)
Total Bilirubin: 0.4 mg/dL (ref 0.2–1.2)
Total Protein: 7.2 g/dL (ref 6.1–8.1)

## 2015-06-25 LAB — CBC WITH DIFFERENTIAL/PLATELET
BASOS ABS: 0 {cells}/uL (ref 0–200)
Basophils Relative: 0 %
EOS ABS: 59 {cells}/uL (ref 15–500)
EOS PCT: 1 %
HCT: 38.1 % (ref 35.0–45.0)
HEMOGLOBIN: 12.2 g/dL (ref 11.7–15.5)
LYMPHS ABS: 3127 {cells}/uL (ref 850–3900)
Lymphocytes Relative: 53 %
MCH: 27.9 pg (ref 27.0–33.0)
MCHC: 32 g/dL (ref 32.0–36.0)
MCV: 87 fL (ref 80.0–100.0)
MPV: 9.4 fL (ref 7.5–12.5)
Monocytes Absolute: 413 cells/uL (ref 200–950)
Monocytes Relative: 7 %
NEUTROS ABS: 2301 {cells}/uL (ref 1500–7800)
Neutrophils Relative %: 39 %
Platelets: 289 10*3/uL (ref 140–400)
RBC: 4.38 MIL/uL (ref 3.80–5.10)
RDW: 14.1 % (ref 11.0–15.0)
WBC: 5.9 10*3/uL (ref 3.8–10.8)

## 2015-06-25 LAB — LIPID PANEL
CHOL/HDL RATIO: 2.6 ratio (ref <=5.0)
CHOLESTEROL: 159 mg/dL (ref 125–200)
HDL: 62 mg/dL (ref >=46)
LDL Cholesterol: 83 mg/dL (ref <130)
Triglycerides: 69 mg/dL (ref <150)
VLDL: 14 mg/dL (ref <30)

## 2015-06-25 LAB — POCT URINALYSIS DIP (DEVICE)
Bilirubin Urine: NEGATIVE
Glucose, UA: NEGATIVE mg/dL
HGB URINE DIPSTICK: NEGATIVE
KETONES UR: NEGATIVE mg/dL
Leukocytes, UA: NEGATIVE
Nitrite: NEGATIVE
PH: 5.5 (ref 5.0–8.0)
PROTEIN: NEGATIVE mg/dL
SPECIFIC GRAVITY, URINE: 1.02 (ref 1.005–1.030)
Urobilinogen, UA: 0.2 mg/dL (ref 0.0–1.0)

## 2015-06-25 LAB — HEMOGLOBIN A1C
Hgb A1c MFr Bld: 5.7 % — ABNORMAL HIGH (ref <5.7)
MEAN PLASMA GLUCOSE: 117 mg/dL

## 2015-06-25 MED ORDER — HYDROCHLOROTHIAZIDE 12.5 MG PO TABS: 12.5000 mg | ORAL_TABLET | Freq: Every day | ORAL | Status: DC

## 2015-06-25 NOTE — Patient Instructions (Signed)
Will start hydrochlorathiazide 12.5 mg for high blood pressure. The patient is asked to make an attempt to improve diet and exercise patterns to aid in medical management of this problem. DASH Eating Plan DASH stands for "Dietary Approaches to Stop Hypertension." The DASH eating plan is a healthy eating plan that has been shown to reduce high blood pressure (hypertension). Additional health benefits may include reducing the risk of type 2 diabetes mellitus, heart disease, and stroke. The DASH eating plan may also help with weight loss. WHAT DO I NEED TO KNOW ABOUT THE DASH EATING PLAN? For the DASH eating plan, you will follow these general guidelines:  Choose foods with a percent daily value for sodium of less than 5% (as listed on the food label).  Use salt-free seasonings or herbs instead of table salt or sea salt.  Check with your health care provider or pharmacist before using salt substitutes.  Eat lower-sodium products, often labeled as "lower sodium" or "no salt added."  Eat fresh foods.  Eat more vegetables, fruits, and low-fat dairy products.  Choose whole grains. Look for the word "whole" as the first word in the ingredient list.  Choose fish and skinless chicken or [place] more often than red meat. Limit fish, poultry, and meat to 6 oz (170 g) each day.  Limit sweets, desserts, sugars, and sugary drinks.  Choose heart-healthy fats.  Limit cheese to 1 oz (28 g) per day.  Eat more home-cooked food and less restaurant, buffet, and fast food.  Limit fried foods.  Cook foods using methods other than frying.  Limit canned vegetables. If you do use them, rinse them well to decrease the sodium.  When eating at a restaurant, ask that your food be prepared with less salt, or no salt if possible. WHAT FOODS CAN I EAT? Seek help from a dietitian for individual calorie needs. Grains Whole grain or whole wheat bread. Brown rice. Whole grain or whole wheat pasta. Quinoa, bulgur,  and whole grain cereals. Low-sodium cereals. Corn or whole wheat flour tortillas. Whole grain cornbread. Whole grain crackers. Low-sodium crackers. Vegetables Fresh or frozen vegetables (raw, steamed, roasted, or grilled). Low-sodium or reduced-sodium tomato and vegetable juices. Low-sodium or reduced-sodium tomato sauce and paste. Low-sodium or reduced-sodium canned vegetables.  Fruits All fresh, canned (in natural juice), or frozen fruits. Meat and Other Protein Products Ground beef (85% or leaner), grass-fed beef, or beef trimmed of fat. Skinless chicken or [place]. Ground chicken or [place]. Pork trimmed of fat. All fish and seafood. Eggs. Dried beans, peas, or lentils. Unsalted nuts and seeds. Unsalted canned beans. Dairy Low-fat dairy products, such as skim or 1% milk, 2% or reduced-fat cheeses, low-fat ricotta or cottage cheese, or plain low-fat yogurt. Low-sodium or reduced-sodium cheeses. Fats and Oils Tub margarines without trans fats. Light or reduced-fat mayonnaise and salad dressings (reduced sodium). Avocado. Safflower, olive, or canola oils. Natural peanut or almond butter. Other Unsalted popcorn and pretzels. The items listed above may not be a complete list of recommended foods or beverages. Contact your dietitian for more options. WHAT FOODS ARE NOT RECOMMENDED? Grains White bread. White pasta. White rice. Refined cornbread. Bagels and croissants. Crackers that contain trans fat. Vegetables Creamed or fried vegetables. Vegetables in a cheese sauce. Regular canned vegetables. Regular canned tomato sauce and paste. Regular tomato and vegetable juices. Fruits Dried fruits. Canned fruit in light or heavy syrup. Fruit juice. Meat and Other Protein Products Fatty cuts of meat. Ribs, chicken wings, bacon, sausage, bologna, salami, chitterlings,  fatback, hot dogs, bratwurst, and packaged luncheon meats. Salted nuts and seeds. Canned beans with salt. Dairy Whole or 2% milk, cream,  half-and-half, and cream cheese. Whole-fat or sweetened yogurt. Full-fat cheeses or blue cheese. Nondairy creamers and whipped toppings. Processed cheese, cheese spreads, or cheese curds. Condiments Onion and garlic salt, seasoned salt, table salt, and sea salt. Canned and packaged gravies. Worcestershire sauce. Tartar sauce. Barbecue sauce. Teriyaki sauce. Soy sauce, including reduced sodium. Steak sauce. Fish sauce. Oyster sauce. Cocktail sauce. Horseradish. Ketchup and mustard. Meat flavorings and tenderizers. Bouillon cubes. Hot sauce. Tabasco sauce. Marinades. Taco seasonings. Relishes. Fats and Oils Butter, stick margarine, lard, shortening, ghee, and bacon fat. Coconut, palm kernel, or palm oils. Regular salad dressings. Other Pickles and olives. Salted popcorn and pretzels. The items listed above may not be a complete list of foods and beverages to avoid. Contact your dietitian for more information. WHERE CAN I FIND MORE INFORMATION? National Heart, Lung, and [employer] Institute: travelstabloid.com   This information is not intended to replace advice given to you by your health care provider. Make sure you discuss any questions you have with your health care provider.   Document Released: 02/20/2011 Document Revised: 03/24/2014 Document Reviewed: 01/05/2013 Elsevier Interactive Patient Education 2016 [employer] Hydrochlorothiazide, HCTZ capsules or tablets What is this medicine? HYDROCHLOROTHIAZIDE (hye droe klor oh THYE a zide) is a diuretic. It increases the amount of urine passed, which causes the body to lose salt and water. This medicine is used to treat high blood pressure. It is also reduces the swelling and water retention caused by various medical conditions, such as heart, liver, or kidney disease. This medicine may be used for other purposes; ask your health care provider or pharmacist if you have questions. What should I tell my health care  provider before I take this medicine? They need to know if you have any of these conditions: -diabetes -gout -immune system problems, like lupus -kidney disease or kidney stones -liver disease -pancreatitis -small amount of urine or difficulty passing urine -an unusual or allergic reaction to hydrochlorothiazide, sulfa drugs, other medicines, foods, dyes, or preservatives -pregnant or trying to get pregnant -breast-feeding How should I use this medicine? Take this medicine by mouth with a glass of water. Follow the directions on the prescription label. Take your medicine at regular intervals. Remember that you will need to pass urine frequently after taking this medicine. Do not take your doses at a time of day that will cause you problems. Do not stop taking your medicine unless your doctor tells you to. Talk to your pediatrician regarding the use of this medicine in children. Special care may be needed. Overdosage: If you think you have taken too much of this medicine contact a poison control center or emergency room at once. NOTE: This medicine is only for you. Do not share this medicine with others. What if I miss a dose? If you miss a dose, take it as soon as you can. If it is almost time for your next dose, take only that dose. Do not take double or extra doses. What may interact with this medicine? -cholestyramine -colestipol -digoxin -dofetilide -lithium -medicines for blood pressure -medicines for diabetes -medicines that relax muscles for surgery -other diuretics -steroid medicines like prednisone or cortisone This list may not describe all possible interactions. Give your health care provider a list of all the medicines, herbs, non-prescription drugs, or dietary supplements you use. Also tell them if you smoke, drink alcohol, or use  illegal drugs. Some items may interact with your medicine. What should I watch for while using this medicine? Visit your doctor or health care  professional for regular checks on your progress. Check your blood pressure as directed. Ask your doctor or health care professional what your blood pressure should be and when you should contact him or her. You may need to be on a special diet while taking this medicine. Ask your doctor. Check with your doctor or health care professional if you get an attack of severe diarrhea, nausea and vomiting, or if you sweat a lot. The loss of too much body fluid can make it dangerous for you to take this medicine. You may get drowsy or dizzy. Do not drive, use machinery, or do anything that needs mental alertness until you know how this medicine affects you. Do not stand or sit up quickly, especially if you are an older patient. This reduces the risk of dizzy or fainting spells. Alcohol may interfere with the effect of this medicine. Avoid alcoholic drinks. This medicine may affect your blood sugar level. If you have diabetes, check with your doctor or health care professional before changing the dose of your diabetic medicine. This medicine can make you more sensitive to the sun. Keep out of the sun. If you cannot avoid being in the sun, wear protective clothing and use sunscreen. Do not use sun lamps or tanning beds/booths. What side effects may I notice from receiving this medicine? Side effects that you should report to your doctor or health care professional as soon as possible: -allergic reactions such as skin rash or itching, hives, swelling of the lips, mouth, tongue, or throat -changes in vision -chest pain -eye pain -fast or irregular heartbeat -feeling faint or lightheaded, falls -gout attack -muscle pain or cramps -pain or difficulty when passing urine -pain, tingling, numbness in the hands or feet -redness, blistering, peeling or loosening of the skin, including inside the mouth -unusually weak or tired Side effects that usually do not require medical attention (report to your doctor or  health care professional if they continue or are bothersome): -change in sex drive or performance -dry mouth -headache -stomach upset This list may not describe all possible side effects. Call your doctor for medical advice about side effects. You may report side effects to FDA at 1-800-FDA-1088. Where should I keep my medicine? Keep out of the reach of children. Store at room temperature between 15 and 30 degrees C (59 and 86 degrees F). Do not freeze. Protect from light and moisture. Keep container closed tightly. Throw away any unused medicine after the expiration date. NOTE: This sheet is a summary. It may not cover all possible information. If you have questions about this medicine, talk to your doctor, pharmacist, or health care provider.    2016, Elsevier/[employer] Standard. (2009-10-26 12:57:37) Health Maintenance, Female Adopting a healthy lifestyle and getting preventive care can go a long way to promote health and wellness. Talk with your health care provider about what schedule of regular examinations is right for you. This is a good chance for you to check in with your provider about disease prevention and staying healthy. In between checkups, there are plenty of things you can do on your own. Experts have done a lot of research about which lifestyle changes and preventive measures are most likely to keep you healthy. Ask your health care provider for more information. WEIGHT AND DIET  Eat a healthy diet  Be sure to include plenty  of vegetables, fruits, low-fat dairy products, and lean protein.  Do not eat a lot of foods high in solid fats, added sugars, or salt.  Get regular exercise. This is one of the most important things you can do for your health.  Most adults should exercise for at least 150 minutes each week. The exercise should increase your heart rate and make you sweat (moderate-intensity exercise).  Most adults should also do strengthening exercises at least twice a  week. This is in addition to the moderate-intensity exercise.  Maintain a healthy weight  Body mass index (BMI) is a measurement that can be used to identify possible weight problems. It estimates body fat based on height and weight. Your health care provider can help determine your BMI and help you achieve or maintain a healthy weight.  For females 63 years of age and older:   A BMI below 18.5 is considered underweight.  A BMI of 18.5 to 24.9 is normal.  A BMI of 25 to 29.9 is considered overweight.  A BMI of 30 and above is considered obese.  Watch levels of cholesterol and blood lipids  You should start having your blood tested for lipids and cholesterol at 49 years of age, then have this test every 5 years.  You may need to have your cholesterol levels checked more often if:  Your lipid or cholesterol levels are high.  You are older than 49 years of age.  You are at high risk for heart disease.  CANCER SCREENING   Lung Cancer  Lung cancer screening is recommended for adults 59-1 years old who are at high risk for lung cancer because of a history of smoking.  A yearly low-dose CT scan of the lungs is recommended for people who:  Currently smoke.  Have quit within the past 15 years.  Have at least a 30-pack-year history of smoking. A pack year is smoking an average of one pack of cigarettes a day for 1 year.  Yearly screening should continue until it has been 15 years since you quit.  Yearly screening should stop if you develop a health problem that would prevent you from having lung cancer treatment.  Breast Cancer  Practice breast self-awareness. This means understanding how your breasts normally appear and feel.  It also means doing regular breast self-exams. Let your health care provider know about any changes, no matter how small.  If you are in your 20s or 30s, you should have a clinical breast exam (CBE) by a health care provider every 1-3 years as part  of a regular health exam.  If you are 71 or older, have a CBE every year. Also consider having a breast X-ray (mammogram) every year.  If you have a family history of breast cancer, talk to your health care provider about genetic screening.  If you are at high risk for breast cancer, talk to your health care provider about having an MRI and a mammogram every year.  Breast cancer gene (BRCA) assessment is recommended for women who have family members with BRCA-related cancers. BRCA-related cancers include:  Breast.  Ovarian.  Tubal.  Peritoneal cancers.  Results of the assessment will determine the need for genetic counseling and BRCA1 and BRCA2 testing. Cervical Cancer Your health care provider may recommend that you be screened regularly for cancer of the pelvic organs (ovaries, uterus, and vagina). This screening involves a pelvic examination, including checking for microscopic changes to the surface of your cervix (Pap test). You may  be encouraged to have this screening done every 3 years, beginning at age 48.  For women ages 43-65, health care providers may recommend pelvic exams and Pap testing every 3 years, or they may recommend the Pap and pelvic exam, combined with testing for human papilloma virus (HPV), every 5 years. Some types of HPV increase your risk of cervical cancer. Testing for HPV may also be done on women of any age with unclear Pap test results.  Other health care providers may not recommend any screening for nonpregnant women who are considered low risk for pelvic cancer and who do not have symptoms. Ask your health care provider if a screening pelvic exam is right for you.  If you have had past treatment for cervical cancer or a condition that could lead to cancer, you need Pap tests and screening for cancer for at least 20 years after your treatment. If Pap tests have been discontinued, your risk factors (such as having a new sexual partner) need to be reassessed to  determine if screening should resume. Some women have medical problems that increase the chance of getting cervical cancer. In these cases, your health care provider may recommend more frequent screening and Pap tests. Colorectal Cancer  This type of cancer can be detected and often prevented.  Routine colorectal cancer screening usually begins at 49 years of age and continues through 49 years of age.  Your health care provider may recommend screening at an earlier age if you have risk factors for colon cancer.  Your health care provider may also recommend using home test kits to check for hidden blood in the stool.  A small camera at the end of a tube can be used to examine your colon directly (sigmoidoscopy or colonoscopy). This is done to check for the earliest forms of colorectal cancer.  Routine screening usually begins at age 89.  Direct examination of the colon should be repeated every 5-10 years through 49 years of age. However, you may need to be screened more often if early forms of precancerous polyps or small growths are found. Skin Cancer  Check your skin from head to toe regularly.  Tell your health care provider about any new moles or changes in moles, especially if there is a change in a mole's shape or color.  Also tell your health care provider if you have a mole that is larger than the size of a pencil eraser.  Always use sunscreen. Apply sunscreen liberally and repeatedly throughout the day.  Protect yourself by wearing long sleeves, pants, a wide-brimmed hat, and sunglasses whenever you are outside. HEART DISEASE, DIABETES, AND HIGH BLOOD PRESSURE   High blood pressure causes heart disease and increases the risk of stroke. High blood pressure is more likely to develop in:  People who have blood pressure in the high end of the normal range (130-139/85-89 mm Hg).  People who are overweight or obese.  People who are African American.  If you are 18-39 years of  age, have your blood pressure checked every 3-5 years. If you are 20 years of age or older, have your blood pressure checked every year. You should have your blood pressure measured twice--once when you are at a hospital or clinic, and once when you are not at a hospital or clinic. Record the average of the two measurements. To check your blood pressure when you are not at a hospital or clinic, you can use:  An automated blood pressure machine at a pharmacy.  A home blood pressure monitor.  If you are between 52 years and 60 years old, ask your health care provider if you should take aspirin to prevent strokes.  Have regular diabetes screenings. This involves taking a blood sample to check your fasting blood sugar level.  If you are at a normal weight and have a low risk for diabetes, have this test once every three years after 49 years of age.  If you are overweight and have a high risk for diabetes, consider being tested at a younger age or more often. PREVENTING INFECTION  Hepatitis B  If you have a higher risk for hepatitis B, you should be screened for this virus. You are considered at high risk for hepatitis B if:  You were born in a country where hepatitis B is common. Ask your health care provider which countries are considered high risk.  Your parents were born in a high-risk country, and you have not been immunized against hepatitis B (hepatitis B vaccine).  You have HIV or AIDS.  You use needles to inject street drugs.  You live with someone who has hepatitis B.  You have had sex with someone who has hepatitis B.  You get hemodialysis treatment.  You take certain medicines for conditions, including cancer, organ transplantation, and autoimmune conditions. Hepatitis C  Blood testing is recommended for:  Everyone born from [birth year] through 1964.  Anyone with known risk factors for hepatitis C. Sexually transmitted infections (STIs)  You should be screened for sexually  transmitted infections (STIs) including gonorrhea and chlamydia if:  You are sexually active and are younger than 49 years of age.  You are older than 49 years of age and your health care provider tells you that you are at risk for this type of infection.  Your sexual activity has changed since you were last screened and you are at an increased risk for chlamydia or gonorrhea. Ask your health care provider if you are at risk.  If you do not have HIV, but are at risk, it may be recommended that you take a prescription medicine daily to prevent HIV infection. This is called pre-exposure prophylaxis (PrEP). You are considered at risk if:  You are sexually active and do not regularly use condoms or know the HIV status of your partner(s).  You take drugs by injection.  You are sexually active with a partner who has HIV. Talk with your health care provider about whether you are at high risk of being infected with HIV. If you choose to begin PrEP, you should first be tested for HIV. You should then be tested every 3 months for as long as you are taking PrEP.  PREGNANCY   If you are premenopausal and you may become pregnant, ask your health care provider about preconception counseling.  If you may become pregnant, take 400 to 800 micrograms (mcg) of folic acid every day.  If you want to prevent pregnancy, talk to your health care provider about birth control (contraception). OSTEOPOROSIS AND MENOPAUSE   Osteoporosis is a disease in which the bones lose minerals and strength with aging. This can result in serious bone fractures. Your risk for osteoporosis can be identified using a bone density scan.  If you are 55 years of age or older, or if you are at risk for osteoporosis and fractures, ask your health care provider if you should be screened.  Ask your health care provider whether you should take a calcium or vitamin D supplement  to lower your risk for osteoporosis.  Menopause may have  certain physical symptoms and risks.  Hormone replacement therapy may reduce some of these symptoms and risks. Talk to your health care provider about whether hormone replacement therapy is right for you.  HOME CARE INSTRUCTIONS   Schedule regular health, dental, and eye exams.  Stay current with your immunizations.   Do not use any tobacco products including cigarettes, chewing tobacco, or electronic cigarettes.  If you are pregnant, do not drink alcohol.  If you are breastfeeding, limit how much and how often you drink alcohol.  Limit alcohol intake to no more than 1 drink per day for nonpregnant women. One drink equals 12 ounces of beer, 5 ounces of wine, or 1 ounces of hard liquor.  Do not use street drugs.  Do not share needles.  Ask your health care provider for help if you need support or information about quitting drugs.  Tell your health care provider if you often feel depressed.  Tell your health care provider if you have ever been abused or do not feel safe at home.   This information is not intended to replace advice given to you by your health care provider. Make sure you discuss any questions you have with your health care provider.   Document Released: 09/16/2010 Document Revised: 03/24/2014 Document Reviewed: 02/02/2013 Elsevier Interactive Patient Education [employer].

## 2015-06-25 NOTE — Progress Notes (Signed)
Subjective:  Michaela Wilson is a 50 y.o. female who presents for a pap smear and prediabetes.  Date of last pap smear and wellness visit was several years ago.   Michaela Wilson has had an elevated blood pressure over the past several office visits. She has a stong family history of hypertension. She does not check blood pressures at home. She does not workout. She denies chest pain, shortness of breath, dizziness.  She is not on cholesterol medication and denies myalgias.She has not had fasting cholesterol labs in many years.  She was previously diagnosed with pre-diabetes. She has not  been working on diet and exercise for prediabetes, and denies foot ulcerations, increased appetite, nausea, paresthesia of the feet, polydipsia, visual disturbances, vomiting and weight loss. Last A1C in the office was:  Lab Results  Component Value Date   HGBA1C 6.0* 03/15/2015    Names of Other Physician/Practitioners you currently use:   Patient Care Team: Michaela Dew, FNP as PCP - General (Family Medicine)   Medication Review: Current Outpatient Prescriptions on File Prior to Visit  Medication Sig Dispense Refill  . acetaminophen (TYLENOL) 325 MG tablet Take 2 tablets (650 mg total) by mouth every 6 (six) hours as needed for mild pain (or Fever >/= 101). (Patient not taking: Reported on 06/25/2015) 30 tablet 0  . amoxicillin-clavulanate (AUGMENTIN) 875-125 MG tablet Take 1 tablet by mouth 2 (two) times daily. (Patient not taking: Reported on 03/27/2015) 20 tablet 0  . doxycycline (VIBRA-TABS) 100 MG tablet Take 1 tablet (100 mg total) by mouth 2 (two) times daily. (Patient not taking: Reported on 03/27/2015) 20 tablet 0   No current facility-administered medications on file prior to visit.    Current Problems (verified) Patient Active Problem List   Diagnosis Date Noted  . Wound check, abscess 03/27/2015  . Cellulitis of right buttock 03/15/2015  . Draining postoperative wound 03/15/2015  .  Sepsis ([place]) 03/07/2015  . Leukocytosis 03/07/2015    Screening Tests Health Maintenance  Topic Date Due  . HIV Screening  02/18/1982  . TETANUS/TDAP  02/18/1986  . PAP SMEAR  02/10/2017      Last mammogram: Several years ago Last pap smear/pelvic exam: 3 years ago   This medication is not covered by your insurance if dispensed from your Physician's office. You can obtain your vaccine at an area Pharmacy.    Medication List       This list is accurate as of: 06/25/15  4:18 PM.  Always use your most recent med list.               hydrochlorothiazide 12.5 MG tablet  Commonly known as:  HYDRODIURIL  Take 1 tablet (12.5 mg total) by mouth daily.        Past Surgical History  Procedure Laterality Date  . Tubal ligation    . Cholecystectomy    . Abcess drainage     History reviewed. No pertinent family history.  History reviewed: allergies, current medications, past family history, past medical history, past social history, past surgical history and problem list    Tobacco Social History  Substance Use Topics  . Smoking status: Never Smoker   . Smokeless tobacco: Never Used  . Alcohol Use: 0.0 oz/week    0 Standard drinks or equivalent per week     Comment: socially    Alcohol Current alcohol use: none  Caffeine Current caffeine use: denies use  Exercise Current exercise: none  Nutrition/Diet Current diet: high  fat/ cholesterol  Cardiac risk factors: sedentary lifestyle.  Depression screen [facility] 2/9 06/25/2015 03/27/2015 03/15/2015  Decreased Interest 0 0 1  Down, Depressed, Hopeless 0 0 0  PHQ - 2 Score 0 0 1   Are you sexually active?  Yes   Advanced directives Does patient have a [place]? No Does patient have a Living Will? No   Objective:     Blood pressure 154/89, pulse 84, temperature 98.7 F (37.1 C), temperature source Oral, resp. rate 16, height 5\' 2"  (1.575 m), weight 151 lb (68.493 kg), SpO2 100 %. Body mass  index is 27.61 kg/(m^2).  General appearance: alert, no distress, WD/WN, female HEENT: normocephalic, sclerae anicteric, TMs pearly, nares patent, no discharge or erythema, pharynx normal Oral cavity: MMM, no lesions Neck: supple, no lymphadenopathy, no thyromegaly, no masses Heart: RRR, normal S1, S2, no murmurs Lungs: CTA bilaterally, no wheezes, rhonchi, or rales Abdomen: +bs, soft, non tender, non distended, no masses, no hepatomegaly, no splenomegaly Musculoskeletal: nontender, no swelling, no obvious deformity Extremities: no edema, no cyanosis, no clubbing Pulses: 2+ symmetric, upper and lower extremities, normal cap refill Neurological: alert, oriented x 3, CN2-12 intact, strength normal upper extremities and lower extremities, sensation normal throughout, DTRs 2+ throughout, no cerebellar signs, gait normal Vaginal:  Minimal discharge, cervix pink color equally distributed. Cervix in the horizontal position, cervix smooth, rugae decreased.  Psychiatric: normal affect, behavior normal, pleasant   Assessment:  Patient denies any difficulties at home. No trouble with ADLs, depression or falls. No recent changes to vision or hearing. Is UTD with immunizations. Is UTD with screening. Discussed Advanced Directives, patient agrees to bring [place] copies of documents if can. Encouraged heart healthy diet, exercise as tolerated and adequate sleep.  Pap smear done today.    BP 154/89 mmHg  Pulse 84  Temp(Src) 98.7 F (37.1 C) (Oral)  Resp 16  Ht 5\' 2"  (1.575 m)  Wt 151 lb (68.493 kg)  BMI 27.61 kg/m2  SpO2 100% Plan:  1. Essential hypertension Blood pressure is above goal. Will start HCTZ 12.5 mg and follow up in 1 month. The patient is asked to make an attempt to improve diet and exercise patterns to aid in medical management of this problem. - hydrochlorothiazide (HYDRODIURIL) 12.5 MG tablet; Take 1 tablet (12.5 mg total) by mouth daily.  Dispense: 30 tablet; Refill: 0 - COMPLETE  METABOLIC PANEL WITH GFR  2. Prediabetes - COMPLETE METABOLIC PANEL WITH GFR - CBC with Differential - Lipid Panel - Hemoglobin A1c  3. Breast cancer screening  - MM DIGITAL SCREENING BILATERAL; Future  4. Encounter for gynecological examination with Papanicolaou smear of cervix - Cytology - PAP [place]  5. Need for Tdap vaccination - Tdap vaccine greater than or equal to 7yo IM   Screening recommendations, referrals: Vaccinations Nutrition assessed and recommended  Recommended yearly ophthalmology/optometry Recommended yearly dental visit for hygiene and checkup Advanced directives - requested   Michaela Dew, FNP   06/25/2015

## 2015-06-26 ENCOUNTER — Telehealth: Payer: Self-pay

## 2015-06-26 NOTE — Telephone Encounter (Signed)
Called and left message for patient advising of improved labs and to continue diet and exercise. Advised patient if any questions to call us back. Thanks!

## 2015-06-26 NOTE — Telephone Encounter (Signed)
-----   Message from Dorena Dew, [place] sent at 06/26/2015  7:31 AM EDT ----- Regarding: lab results Please inform Ms. Horvitz that hemoglobin a1C has improved from 6.0 to 5.7%. Continue diet and exercise regimen as discussed. All other laboratory values are within a normal range.   Thanks  ----- Message -----    From: [facility] in [facility] Interface    Sent: 06/25/2015  11:59 PM      To: Dorena Dew, FNP

## 2015-06-28 LAB — CYTOLOGY - PAP

## 2015-07-02 ENCOUNTER — Other Ambulatory Visit: Payer: Self-pay | Admitting: Family Medicine

## 2015-07-02 DIAGNOSIS — I10 Essential (primary) hypertension: Secondary | ICD-10-CM

## 2015-07-02 MED ORDER — HYDROCHLOROTHIAZIDE 12.5 MG PO TABS: 12.5000 mg | ORAL_TABLET | Freq: Every day | ORAL | Status: DC

## 2015-07-31 ENCOUNTER — Ambulatory Visit: Payer: 59 | Admitting: Family Medicine

## 2015-08-15 ENCOUNTER — Encounter: Payer: Self-pay | Admitting: Family Medicine

## 2015-08-15 ENCOUNTER — Ambulatory Visit (INDEPENDENT_AMBULATORY_CARE_PROVIDER_SITE_OTHER): Payer: 59 | Admitting: Family Medicine

## 2015-08-15 VITALS — BP 128/78 | HR 79 | Temp 98.6°F | Resp 16 | Ht 62.0 in | Wt 150.0 lb

## 2015-08-15 DIAGNOSIS — I10 Essential (primary) hypertension: Secondary | ICD-10-CM

## 2015-08-15 LAB — POCT URINALYSIS DIP (DEVICE)
BILIRUBIN URINE: NEGATIVE
GLUCOSE, UA: NEGATIVE mg/dL
Hgb urine dipstick: NEGATIVE
KETONES UR: NEGATIVE mg/dL
Leukocytes, UA: NEGATIVE
Nitrite: NEGATIVE
PROTEIN: NEGATIVE mg/dL
SPECIFIC GRAVITY, URINE: 1.025 (ref 1.005–1.030)
Urobilinogen, UA: 0.2 mg/dL (ref 0.0–1.0)
pH: 5 (ref 5.0–8.0)

## 2015-08-15 MED ORDER — HYDROCHLOROTHIAZIDE 12.5 MG PO TABS: 12.5000 mg | ORAL_TABLET | Freq: Every day | ORAL | Status: DC

## 2015-08-15 NOTE — Progress Notes (Signed)
Subjective:    Patient ID: Michaela Wilson, female    DOB: 02-Nov-1965, 50 y.o.   MRN: [id number]  Hypertension This is a chronic problem. The current episode started more than 1 month ago. The problem has been rapidly improving since onset. The problem is controlled. Pertinent negatives include no anxiety, blurred vision, chest pain, headaches, malaise/fatigue, neck pain, orthopnea, palpitations, peripheral edema, PND, shortness of breath or sweats. There are no associated agents to hypertension. There are no known risk factors for coronary artery disease. Past treatments include diuretics (Started hydrochlorothiazide 12.5 mg 1 month ago). The current treatment provides significant improvement. There are no compliance problems.  There is no history of angina, kidney disease, CAD/MI, CVA, heart failure, left ventricular hypertrophy, PVD, renovascular disease, retinopathy or a thyroid problem. There is no history of chronic renal disease, coarctation of the aorta, hyperaldosteronism, hypercortisolism, hyperparathyroidism, a hypertension causing med, pheochromocytoma or sleep apnea.   Past Medical History  Diagnosis Date  . Cellulitis of right buttock 03/07/2015  . Hypertension    Immunization History  Administered Date(s) Administered  . Tdap 06/25/2015   Social History   Social History  . Marital Status: Divorced    Spouse Name: N/A  . Number of Children: N/A  . Years of Education: N/A   Occupational History  . Not on file.   Social History Main Topics  . Smoking status: Never Smoker   . Smokeless tobacco: Never Used  . Alcohol Use: 0.0 oz/week    0 Standard drinks or equivalent per week     Comment: socially  . Drug Use: No  . Sexual Activity: Not Currently   Other Topics Concern  . Not on file   Social History Narrative      Review of Systems  Constitutional: Negative.  Negative for malaise/fatigue.  HENT: Negative.   Eyes: Negative.  Negative for blurred vision.   Respiratory: Negative.  Negative for shortness of breath.   Cardiovascular: Negative.  Negative for chest pain, palpitations, orthopnea and PND.  Gastrointestinal: Negative.   Endocrine: Negative.  Negative for polydipsia, polyphagia and polyuria.  Genitourinary: Negative.  Negative for dysuria.  Musculoskeletal: Negative.  Negative for neck pain.  Skin: Negative.   Allergic/Immunologic: Negative.   Neurological: Negative.  Negative for headaches.  Hematological: Negative.   Psychiatric/Behavioral: Negative.        Objective:   Physical Exam  Constitutional: She is oriented to person, place, and time. She appears well-developed and well-nourished.  HENT:  Head: Normocephalic and atraumatic.  Right Ear: External ear normal.  Left Ear: External ear normal.  Nose: Nose normal.  Mouth/Throat: Oropharynx is clear and moist.  Eyes: Conjunctivae and EOM are normal. Pupils are equal, round, and reactive to light.  Neck: Normal range of motion. Neck supple.  Cardiovascular: Normal rate, regular rhythm, normal heart sounds and intact distal pulses.   Pulmonary/Chest: Effort normal and breath sounds normal.  Abdominal: Soft. Bowel sounds are normal.  Musculoskeletal: Normal range of motion.  Neurological: She is alert and oriented to person, place, and time. She has normal reflexes.  Skin: Skin is warm and dry.  Psychiatric: She has a normal mood and affect. Her behavior is normal. Judgment and thought content normal.         BP 128/78 mmHg  Pulse 79  Temp(Src) 98.6 F (37 C) (Oral)  Resp 16  Ht 5\' 2"  (1.575 m)  Wt 150 lb (68.04 kg)  BMI 27.43 kg/m2  SpO2 98% Assessment & Plan:  1. Essential hypertension Blood pressure is at goal on current medication regimen. Will continue medication at current dosage. No proteinuria presetn. Will follow up in office in 3 months for hypertension.  - POCT urinalysis dip (device) - hydrochlorothiazide (HYDRODIURIL) 12.5 MG tablet; Take 1  tablet (12.5 mg total) by mouth daily.  Dispense: 90 tablet; Refill: 1   RTC: 3 months for hypertension Dorena Dew, FNP

## 2015-08-15 NOTE — Patient Instructions (Signed)
DASH Eating Plan  DASH stands for "Dietary Approaches to Stop Hypertension." The DASH eating plan is a healthy eating plan that has been shown to reduce high blood pressure (hypertension). Additional health benefits may include reducing the risk of type 2 diabetes mellitus, heart disease, and stroke. The DASH eating plan may also help with weight loss.  WHAT DO I NEED TO KNOW ABOUT THE DASH EATING PLAN?  For the DASH eating plan, you will follow these general guidelines:  · Choose foods with a percent daily value for sodium of less than 5% (as listed on the food label).  · Use salt-free seasonings or herbs instead of table salt or sea salt.  · Check with your health care provider or pharmacist before using salt substitutes.  · Eat lower-sodium products, often labeled as "lower sodium" or "no salt added."  · Eat fresh foods.  · Eat more vegetables, fruits, and low-fat dairy products.  · Choose whole grains. Look for the word "whole" as the first word in the ingredient list.  · Choose fish and skinless chicken or turkey more often than red meat. Limit fish, poultry, and meat to 6 oz (170 g) each day.  · Limit sweets, desserts, sugars, and sugary drinks.  · Choose heart-healthy fats.  · Limit cheese to 1 oz (28 g) per day.  · Eat more home-cooked food and less restaurant, buffet, and fast food.  · Limit fried foods.  · Cook foods using methods other than frying.  · Limit canned vegetables. If you do use them, rinse them well to decrease the sodium.  · When eating at a restaurant, ask that your food be prepared with less salt, or no salt if possible.  WHAT FOODS CAN I EAT?  Seek help from a dietitian for individual calorie needs.  Grains  Whole grain or whole wheat bread. Brown rice. Whole grain or whole wheat pasta. Quinoa, bulgur, and whole grain cereals. Low-sodium cereals. Corn or whole wheat flour tortillas. Whole grain cornbread. Whole grain crackers. Low-sodium crackers.  Vegetables  Fresh or frozen vegetables  (raw, steamed, roasted, or grilled). Low-sodium or reduced-sodium tomato and vegetable juices. Low-sodium or reduced-sodium tomato sauce and paste. Low-sodium or reduced-sodium canned vegetables.   Fruits  All fresh, canned (in natural juice), or frozen fruits.  Meat and Other Protein Products  Ground beef (85% or leaner), grass-fed beef, or beef trimmed of fat. Skinless chicken or turkey. Ground chicken or turkey. Pork trimmed of fat. All fish and seafood. Eggs. Dried beans, peas, or lentils. Unsalted nuts and seeds. Unsalted canned beans.  Dairy  Low-fat dairy products, such as skim or 1% milk, 2% or reduced-fat cheeses, low-fat ricotta or cottage cheese, or plain low-fat yogurt. Low-sodium or reduced-sodium cheeses.  Fats and Oils  Tub margarines without trans fats. Light or reduced-fat mayonnaise and salad dressings (reduced sodium). Avocado. Safflower, olive, or canola oils. Natural peanut or almond butter.  Other  Unsalted popcorn and pretzels.  The items listed above may not be a complete list of recommended foods or beverages. Contact your dietitian for more options.  WHAT FOODS ARE NOT RECOMMENDED?  Grains  White bread. White pasta. White rice. Refined cornbread. Bagels and croissants. Crackers that contain trans fat.  Vegetables  Creamed or fried vegetables. Vegetables in a cheese sauce. Regular canned vegetables. Regular canned tomato sauce and paste. Regular tomato and vegetable juices.  Fruits  Dried fruits. Canned fruit in light or heavy syrup. Fruit juice.  Meat and Other Protein   Products  Fatty cuts of meat. Ribs, chicken wings, bacon, sausage, bologna, salami, chitterlings, fatback, hot dogs, bratwurst, and packaged luncheon meats. Salted nuts and seeds. Canned beans with salt.  Dairy  Whole or 2% milk, cream, half-and-half, and cream cheese. Whole-fat or sweetened yogurt. Full-fat cheeses or blue cheese. Nondairy creamers and whipped toppings. Processed cheese, cheese spreads, or cheese  curds.  Condiments  Onion and garlic salt, seasoned salt, table salt, and sea salt. Canned and packaged gravies. Worcestershire sauce. Tartar sauce. Barbecue sauce. Teriyaki sauce. Soy sauce, including reduced sodium. Steak sauce. Fish sauce. Oyster sauce. Cocktail sauce. Horseradish. Ketchup and mustard. Meat flavorings and tenderizers. Bouillon cubes. Hot sauce. Tabasco sauce. Marinades. Taco seasonings. Relishes.  Fats and Oils  Butter, stick margarine, lard, shortening, ghee, and bacon fat. Coconut, palm kernel, or palm oils. Regular salad dressings.  Other  Pickles and olives. Salted popcorn and pretzels.  The items listed above may not be a complete list of foods and beverages to avoid. Contact your dietitian for more information.  WHERE CAN I FIND MORE INFORMATION?  National Heart, Lung, and Blood Institute: www.nhlbi.nih.gov/health/health-topics/topics/dash/     This information is not intended to replace advice given to you by your health care provider. Make sure you discuss any questions you have with your health care provider.     Document Released: 02/20/2011 Document Revised: 03/24/2014 Document Reviewed: 01/05/2013  Elsevier Interactive Patient Education ©2016 Elsevier Inc.

## 2015-11-15 ENCOUNTER — Ambulatory Visit: Payer: 59 | Admitting: Family Medicine

## 2015-12-17 NOTE — Progress Notes (Signed)
This encounter was created in error - please disregard.

## 2015-12-20 ENCOUNTER — Ambulatory Visit: Payer: 59 | Admitting: Family Medicine

## 2016-04-09 ENCOUNTER — Other Ambulatory Visit: Payer: Self-pay | Admitting: Family Medicine

## 2016-04-09 DIAGNOSIS — I10 Essential (primary) hypertension: Secondary | ICD-10-CM

## 2016-04-29 ENCOUNTER — Ambulatory Visit (HOSPITAL_COMMUNITY)
Admission: EM | Admit: 2016-04-29 | Discharge: 2016-04-29 | Disposition: A | Discharge status: Home / Self Care | Payer: 59 | Attending: Family Medicine | Admitting: Family Medicine

## 2016-04-29 ENCOUNTER — Encounter (HOSPITAL_COMMUNITY): Payer: Self-pay | Admitting: Emergency Medicine

## 2016-04-29 DIAGNOSIS — L308 Other specified dermatitis: Secondary | ICD-10-CM | POA: Diagnosis not present

## 2016-04-29 DIAGNOSIS — B028 Zoster with other complications: Secondary | ICD-10-CM | POA: Diagnosis not present

## 2016-04-29 MED ORDER — GABAPENTIN 300 MG PO CAPS: 300.0000 mg | ORAL_CAPSULE | Freq: Three times a day (TID) | ORAL | 1 refills | Status: DC

## 2016-04-29 MED ORDER — VALACYCLOVIR HCL 1 G PO TABS: 1000.0000 mg | ORAL_TABLET | Freq: Three times a day (TID) | ORAL | 0 refills | Status: DC

## 2016-04-29 NOTE — Discharge Instructions (Signed)
Use medicine as prescribed, return if problems

## 2016-04-29 NOTE — ED Provider Notes (Signed)
[facility]    CSN: [id number] Arrival date & time: 04/29/16  [id number]     History   Chief Complaint Chief Complaint  Patient presents with  . Abdominal Pain    LUQ    HPI ARRYN Wilson is a 51 y.o. female.   The history is provided by the patient.  Abdominal Pain  Pain location:  L flank Pain quality: burning   Pain radiates to:  L flank Pain severity:  Mild Onset quality:  Gradual Duration:  1 week Progression:  Unchanged Chronicity:  New Context: not alcohol use and not awakening from sleep   Relieved by:  OTC medications Worsened by:  Palpation and movement Associated symptoms: no fever and no nausea     Past Medical History:  Diagnosis Date  . Cellulitis of right buttock 03/07/2015  . Hypertension     Patient Active Problem List   Diagnosis Date Noted  . Essential hypertension 06/25/2015  . Prediabetes 06/25/2015  . Sepsis ([place]) 03/07/2015  . Leukocytosis 03/07/2015    Past Surgical History:  Procedure Laterality Date  . ABCESS DRAINAGE    . CHOLECYSTECTOMY    . TUBAL LIGATION      OB History    No data available       Home Medications    Prior to Admission medications   Medication Sig Start Date End Date Taking? Authorizing Provider  gabapentin (NEURONTIN) 300 MG capsule Take 1 capsule (300 mg total) by mouth 3 (three) times daily. 04/29/16   Billy Fischer, MD  hydrochlorothiazide (HYDRODIURIL) 12.5 MG tablet Take 1 tablet (12.5 mg total) by mouth daily. 08/15/15   Dorena Dew, FNP  valACYclovir (VALTREX) 1000 MG tablet Take 1 tablet (1,000 mg total) by mouth 3 (three) times daily. 04/29/16   Billy Fischer, MD    Family History No family history on file.  Social History Social History  Substance Use Topics  . Smoking status: Never Smoker  . Smokeless tobacco: Never Used  . Alcohol use 0.0 oz/week     Comment: socially     Allergies   Patient has no known allergies.   Review of Systems Review of Systems    Constitutional: Negative.  Negative for fever.  Respiratory: Negative.   Cardiovascular: Negative.   Gastrointestinal: Positive for abdominal pain. Negative for nausea.  Genitourinary: Negative.   All other systems reviewed and are negative.    Physical Exam Triage Vital Signs ED Triage Vitals  Enc Vitals Group     BP 04/29/16 1845 143/92     Pulse Rate 04/29/16 1845 76     Resp 04/29/16 1845 17     Temp 04/29/16 1845 98 F (36.7 C)     Temp Source 04/29/16 1845 Oral     SpO2 04/29/16 1845 97 %     Weight 04/29/16 1843 154 lb (69.9 kg)     Height 04/29/16 1843 5\' 2"  (1.575 m)     Head Circumference --      Peak Flow --      Pain Score 04/29/16 1844 5     Pain Loc --      Pain Edu? --      Excl. in [place]? --    No data found.   Updated Vital Signs BP 143/92 (BP Location: Right Arm)   Pulse 76   Temp 98 F (36.7 C) (Oral)   Resp 17   Ht 5\' 2"  (1.575 m)   Wt 154 lb (69.9  kg)   SpO2 97%   BMI 28.17 kg/m   Visual Acuity Right Eye Distance:   Left Eye Distance:   Bilateral Distance:    Right Eye Near:   Left Eye Near:    Bilateral Near:     Physical Exam  Constitutional: She is oriented to person, place, and time. She appears well-developed and well-nourished.  Cardiovascular: Normal rate, regular rhythm, normal heart sounds and intact distal pulses.   Pulmonary/Chest: Effort normal and breath sounds normal. No respiratory distress. She has no wheezes. She has no rales. She exhibits tenderness.  Abdominal: Soft. Bowel sounds are normal.  Neurological: She is alert and oriented to person, place, and time.  Skin: Skin is warm and dry. No rash noted.  Nursing note and vitals reviewed.    UC Treatments / Results  Labs (all labs ordered are listed, but only abnormal results are displayed) Labs Reviewed - No data to display  EKG  EKG Interpretation None       Radiology No results found.  Procedures Procedures (including critical care  time)  Medications Ordered in UC Medications - No data to display   Initial Impression / Assessment and Plan / UC Course  I have reviewed the triage vital signs and the nursing notes.  Pertinent labs & imaging results that were available during my care of the patient were reviewed by me and considered in my medical decision making (see chart for details).       Final Clinical Impressions(s) / UC Diagnoses   Final diagnoses:  Herpes zoster dermatitis    New Prescriptions Discharge Medication List as of 04/29/2016  7:43 PM    START taking these medications   Details  gabapentin (NEURONTIN) 300 MG capsule Take 1 capsule (300 mg total) by mouth 3 (three) times daily., Starting Tue 04/29/2016, Print    valACYclovir (VALTREX) 1000 MG tablet Take 1 tablet (1,000 mg total) by mouth 3 (three) times daily., Starting Tue 04/29/2016, Print         Billy Fischer, MD 04/29/16 2059

## 2016-04-29 NOTE — ED Triage Notes (Signed)
Pt. Stated, I started having a fluttering or throbbing a week ago on the left upper abdomen all around to back.

## 2016-05-09 ENCOUNTER — Encounter: Payer: Self-pay | Admitting: Family Medicine

## 2016-05-09 ENCOUNTER — Ambulatory Visit (INDEPENDENT_AMBULATORY_CARE_PROVIDER_SITE_OTHER): Payer: 59 | Admitting: Family Medicine

## 2016-05-09 VITALS — BP 150/84 | HR 83 | Temp 98.7°F | Resp 14 | Ht 62.0 in | Wt 155.0 lb

## 2016-05-09 DIAGNOSIS — Z6828 Body mass index (BMI) 28.0-28.9, adult: Secondary | ICD-10-CM | POA: Diagnosis not present

## 2016-05-09 DIAGNOSIS — R7303 Prediabetes: Secondary | ICD-10-CM

## 2016-05-09 DIAGNOSIS — I10 Essential (primary) hypertension: Secondary | ICD-10-CM

## 2016-05-09 LAB — LIPID PANEL
CHOL/HDL RATIO: 3.4 ratio (ref <5.0)
CHOLESTEROL: 203 mg/dL — AB (ref <200)
HDL: 60 mg/dL (ref >50)
LDL CALC: 132 mg/dL — AB (ref <100)
TRIGLYCERIDES: 56 mg/dL (ref <150)
VLDL: 11 mg/dL (ref <30)

## 2016-05-09 LAB — POCT URINALYSIS DIP (DEVICE)
BILIRUBIN URINE: NEGATIVE
Glucose, UA: NEGATIVE mg/dL
HGB URINE DIPSTICK: NEGATIVE
Ketones, ur: NEGATIVE mg/dL
Leukocytes, UA: NEGATIVE
NITRITE: NEGATIVE
PH: 7 (ref 5.0–8.0)
PROTEIN: NEGATIVE mg/dL
Specific Gravity, Urine: 1.02 (ref 1.005–1.030)
Urobilinogen, UA: 0.2 mg/dL (ref 0.0–1.0)

## 2016-05-09 LAB — BASIC METABOLIC PANEL
BUN: 14 mg/dL (ref 7–25)
CHLORIDE: 104 mmol/L (ref 98–110)
CO2: 24 mmol/L (ref 20–31)
Calcium: 9.4 mg/dL (ref 8.6–10.2)
Creat: 0.97 mg/dL (ref 0.50–1.10)
Glucose, Bld: 95 mg/dL (ref 65–99)
Potassium: 4.1 mmol/L (ref 3.5–5.3)
SODIUM: 139 mmol/L (ref 135–146)

## 2016-05-09 MED ORDER — HYDROCHLOROTHIAZIDE 12.5 MG PO TABS: 12.5000 mg | ORAL_TABLET | Freq: Every day | ORAL | 1 refills | Status: DC

## 2016-05-09 NOTE — Progress Notes (Signed)
Subjective:    Patient ID: Michaela Wilson, female    DOB: 1965-09-19, 51 y.o.   MRN: [id number]  Hypertension  This is a chronic problem. The current episode started more than 1 year ago. The problem is uncontrolled. Pertinent negatives include no anxiety, blurred vision, chest pain, headaches, malaise/fatigue, neck pain, orthopnea, palpitations, peripheral edema, PND, shortness of breath or sweats. There are no associated agents to hypertension. Risk factors for coronary artery disease include post-menopausal state and sedentary lifestyle. Compliance problems include diet and exercise.  There is no history of angina, kidney disease, CAD/MI, CVA, heart failure, left ventricular hypertrophy, PVD, renovascular disease or retinopathy. There is no history of chronic renal disease, coarctation of the aorta, hyperaldosteronism, a hypertension causing med, pheochromocytoma, sleep apnea or a thyroid problem.   Past Medical History:  Diagnosis Date  . Cellulitis of right buttock 03/07/2015  . Hypertension    Social History   Social History  . Marital status: Divorced    Spouse name: N/A  . Number of children: N/A  . Years of education: N/A   Occupational History  . Not on file.   Social History Main Topics  . Smoking status: Never Smoker  . Smokeless tobacco: Never Used  . Alcohol use 0.0 oz/week     Comment: socially  . Drug use: No  . Sexual activity: Not Currently   Other Topics Concern  . Not on file   Social History Narrative  . No narrative on file  No Known Allergies    Review of Systems  Constitutional: Negative.  Negative for malaise/fatigue.  HENT: Negative.   Eyes: Negative.  Negative for blurred vision.  Respiratory: Negative.  Negative for shortness of breath.   Cardiovascular: Negative.  Negative for chest pain, palpitations, orthopnea and PND.  Gastrointestinal: Negative.   Endocrine: Negative for polydipsia, polyphagia and polyuria.  Musculoskeletal: Negative.   Negative for neck pain.  Skin: Negative.   Allergic/Immunologic: Negative.   Neurological: Negative.  Negative for headaches.  Hematological: Negative.   Psychiatric/Behavioral: Negative.        Objective:   Physical Exam  Constitutional: She is oriented to person, place, and time. She appears well-developed.  HENT:  Head: Normocephalic and atraumatic.  Right Ear: External ear normal.  Left Ear: External ear normal.  Mouth/Throat: Oropharynx is clear and moist.  Eyes: Conjunctivae and EOM are normal. Pupils are equal, round, and reactive to light.  Neck: Normal range of motion. Neck supple.  Cardiovascular: Normal rate, regular rhythm, normal heart sounds and intact distal pulses.   Pulmonary/Chest: Effort normal and breath sounds normal.  Abdominal: Soft. Bowel sounds are normal.  Musculoskeletal: Normal range of motion.  Neurological: She is alert and oriented to person, place, and time. She has normal reflexes.  Skin: Skin is warm, dry and intact.  Psychiatric: She has a normal mood and affect. Her behavior is normal. Judgment and thought content normal.       BP (!) 150/84 (BP Location: Right Arm, Patient Position: Sitting, Cuff Size: Normal)   Pulse 83   Temp 98.7 F (37.1 C) (Oral)   Resp 14   Ht 5\' 2"  (1.575 m)   Wt 155 lb (70.3 kg)   SpO2 99%   BMI 28.35 kg/m  Assessment & Plan:  1. Essential hypertension Blood pressure is above goal on current medication regimen. She has been out of antihypertensive medication for 2 days.  - Basic Metabolic Panel - Lipid Panel - hydrochlorothiazide (HYDRODIURIL) 12.5 MG  tablet; Take 1 tablet (12.5 mg total) by mouth daily.  Dispense: 90 tablet; Refill: 1  2. Prediabetes Hemoglobin a1C is 5.7, which is at goal.   - Lipid Panel  3. BMI 28.0-28.9,adult Recommend a lowfat, low carbohydrate diet divided over 5-6 small meals, increase water intake to 6-8 glasses, and 150 minutes per week of cardiovascular exercise.     RTC:  6 months for hypertension   Dorena Dew, FNP

## 2016-05-12 ENCOUNTER — Telehealth: Payer: Self-pay

## 2016-05-12 NOTE — Telephone Encounter (Signed)
-----   Message from Dorena Dew, [place] sent at 05/12/2016  8:18 AM EST ----- Regarding: lab results  Please inform Ms. Cipollone that cholesterol is elevated. LDL (bad cholesterol) is 132, goal is <100. Recommend a lowfat, low carbohydrate diet divided over 5-6 small meals, increase water intake to 6-8 glasses, and 150 minutes per week of cardiovascular exercise. Also, start OTC omega 3 (fish oil) tablets daily with dinner. Will re-check cholesterol level in 6 months.  Thanks ----- Message ----- From: Interface, Lab In [facility] Sent: 05/09/2016  11:04 PM To: Dorena Dew, FNP

## 2016-05-12 NOTE — Telephone Encounter (Signed)
Called, no answer. Left message for patient to return call when available. Thanks!

## 2016-05-13 NOTE — Telephone Encounter (Signed)
Patient returned call. I advised of elevated cholesterol and to take OTC Fish oil tablet daily with dinner, advised to eat low fat/low carb diet over 5 to 6 small meals daily. Advised to drink 6 to 8 glasses of water daily and exercise 150 minutes weekly. Advised that we will recheck cholesterol in 6 months and to keep next follow up appointment. Thanks!

## 2016-05-13 NOTE — Telephone Encounter (Signed)
Called, no answer. Left message for patient to return call. Thanks!  

## 2016-08-06 ENCOUNTER — Encounter: Payer: Self-pay | Admitting: Family Medicine

## 2016-08-06 ENCOUNTER — Ambulatory Visit (INDEPENDENT_AMBULATORY_CARE_PROVIDER_SITE_OTHER): Payer: 59 | Admitting: Family Medicine

## 2016-08-06 VITALS — BP 136/84 | HR 79 | Temp 98.2°F | Resp 14 | Ht 62.0 in | Wt 157.0 lb

## 2016-08-06 DIAGNOSIS — I10 Essential (primary) hypertension: Secondary | ICD-10-CM

## 2016-08-06 DIAGNOSIS — R12 Heartburn: Secondary | ICD-10-CM

## 2016-08-06 LAB — POCT URINALYSIS DIP (DEVICE)
Bilirubin Urine: NEGATIVE
GLUCOSE, UA: NEGATIVE mg/dL
Hgb urine dipstick: NEGATIVE
Ketones, ur: NEGATIVE mg/dL
LEUKOCYTES UA: NEGATIVE
NITRITE: NEGATIVE
Protein, ur: NEGATIVE mg/dL
Specific Gravity, Urine: 1.02 (ref 1.005–1.030)
UROBILINOGEN UA: 0.2 mg/dL (ref 0.0–1.0)
pH: 6 (ref 5.0–8.0)

## 2016-08-06 LAB — BASIC METABOLIC PANEL
BUN: 17 mg/dL (ref 7–25)
CALCIUM: 9.1 mg/dL (ref 8.6–10.2)
CHLORIDE: 107 mmol/L (ref 98–110)
CO2: 23 mmol/L (ref 20–31)
CREATININE: 0.92 mg/dL (ref 0.50–1.10)
GLUCOSE: 95 mg/dL (ref 65–99)
Potassium: 4.4 mmol/L (ref 3.5–5.3)
Sodium: 139 mmol/L (ref 135–146)

## 2016-08-06 NOTE — Progress Notes (Signed)
Subjective:    Patient ID: Michaela Wilson, female    DOB: 21-Mar-1965, 51 y.o.   MRN: [id number]  Ms. Helling is a 51 year old female with a history of hypertension that presents for follow up. She has been exercising, following a low fat diet, and taking medication consistently over the past several months.    Hypertension  This is a chronic problem. The current episode started more than 1 year ago. The problem is uncontrolled. Pertinent negatives include no anxiety, blurred vision, headaches, malaise/fatigue, neck pain, orthopnea, palpitations, peripheral edema, PND, shortness of breath or sweats. There are no associated agents to hypertension. Risk factors for coronary artery disease include post-menopausal state and sedentary lifestyle. Compliance problems include diet and exercise.  There is no history of angina, kidney disease, CAD/MI, CVA, heart failure, left ventricular hypertrophy, PVD or retinopathy. There is no history of chronic renal disease, coarctation of the aorta, hyperaldosteronism, a hypertension causing med, pheochromocytoma, renovascular disease, sleep apnea or a thyroid problem.   Past Medical History:  Diagnosis Date  . Cellulitis of right buttock 03/07/2015  . Hypertension    Social History   Social History  . Marital status: Divorced    Spouse name: N/A  . Number of children: N/A  . Years of education: N/A   Occupational History  . Not on file.   Social History Main Topics  . Smoking status: Never Smoker  . Smokeless tobacco: Never Used  . Alcohol use 0.0 oz/week     Comment: socially  . Drug use: No  . Sexual activity: Not Currently   Other Topics Concern  . Not on file   Social History Narrative  . No narrative on file  No Known Allergies    Review of Systems  Constitutional: Negative.  Negative for malaise/fatigue.  HENT: Negative.   Eyes: Negative.  Negative for blurred vision.  Respiratory: Negative.  Negative for shortness of breath.    Cardiovascular: Negative.  Negative for palpitations, orthopnea and PND.  Gastrointestinal: Negative.        Occasional heartburn  Endocrine: Negative for polydipsia, polyphagia and polyuria.  Musculoskeletal: Negative.  Negative for neck pain.  Skin: Negative.   Allergic/Immunologic: Negative.   Neurological: Negative.  Negative for headaches.  Hematological: Negative.   Psychiatric/Behavioral: Negative.        Objective:   Physical Exam  Constitutional: She is oriented to person, place, and time. She appears well-developed.  HENT:  Head: Normocephalic and atraumatic.  Right Ear: External ear normal.  Left Ear: External ear normal.  Mouth/Throat: Oropharynx is clear and moist.  Eyes: Conjunctivae and EOM are normal. Pupils are equal, round, and reactive to light.  Neck: Normal range of motion. Neck supple.  Cardiovascular: Normal rate, regular rhythm, normal heart sounds and intact distal pulses.   Pulmonary/Chest: Effort normal and breath sounds normal.  Abdominal: Soft. Bowel sounds are normal.  Musculoskeletal: Normal range of motion.  Neurological: She is alert and oriented to person, place, and time. She has normal reflexes.  Skin: Skin is warm, dry and intact.  Psychiatric: She has a normal mood and affect. Her behavior is normal. Judgment and thought content normal.       BP 136/84 Comment: manual  Pulse 79   Temp 98.2 F (36.8 C) (Oral)   Resp 14   Ht 5\' 2"  (1.575 m)   Wt 157 lb (71.2 kg)   SpO2 99%   BMI 28.72 kg/m  Assessment & Plan:  1. Essential  hypertension Blood pressure is at goal on current medication regimen. Will continue medication as prescribed. No proteinuria present.  Recommend a lowfat, low carbohydrate diet divided over 5-6 small meals, increase water intake to 6-8 glasses, and 150 minutes per week of cardiovascular exercise.    - Basic Metabolic Panel  2. Heartburn Discussed diet at length. Decrease meal portions and increase water  intake . Refrain from eating within an hour of lying down.    RTC: 6 months for hypertension   The patient was given clear instructions to go to ER or return to medical center if symptoms do not improve, worsen or new problems develop. The patient verbalized understanding.     Donia Pounds  MSN, FNP-C [place] [address] [place], [place] [address] [phone]

## 2016-10-07 ENCOUNTER — Encounter: Payer: Self-pay | Admitting: Family Medicine

## 2016-10-07 ENCOUNTER — Ambulatory Visit (INDEPENDENT_AMBULATORY_CARE_PROVIDER_SITE_OTHER): Payer: 59 | Admitting: Family Medicine

## 2016-10-07 VITALS — BP 133/89 | HR 84 | Temp 98.7°F | Resp 16 | Ht 62.0 in | Wt 159.0 lb

## 2016-10-07 DIAGNOSIS — R42 Dizziness and giddiness: Secondary | ICD-10-CM | POA: Diagnosis not present

## 2016-10-07 DIAGNOSIS — R7303 Prediabetes: Secondary | ICD-10-CM | POA: Diagnosis not present

## 2016-10-07 DIAGNOSIS — R5383 Other fatigue: Secondary | ICD-10-CM

## 2016-10-07 LAB — CBC WITH DIFFERENTIAL/PLATELET
Basophils Absolute: 0 cells/uL (ref 0–200)
Basophils Relative: 0 %
Eosinophils Absolute: 122 cells/uL (ref 15–500)
Eosinophils Relative: 2 %
HCT: 41.9 % (ref 35.0–45.0)
HEMOGLOBIN: 14 g/dL (ref 11.7–15.5)
LYMPHS ABS: 2623 {cells}/uL (ref 850–3900)
Lymphocytes Relative: 43 %
MCH: 28.9 pg (ref 27.0–33.0)
MCHC: 33.4 g/dL (ref 32.0–36.0)
MCV: 86.6 fL (ref 80.0–100.0)
MPV: 9.2 fL (ref 7.5–12.5)
Monocytes Absolute: 610 cells/uL (ref 200–950)
Monocytes Relative: 10 %
NEUTROS ABS: 2745 {cells}/uL (ref 1500–7800)
Neutrophils Relative %: 45 %
Platelets: 307 10*3/uL (ref 140–400)
RBC: 4.84 MIL/uL (ref 3.80–5.10)
RDW: 14.3 % (ref 11.0–15.0)
WBC: 6.1 10*3/uL (ref 3.8–10.8)

## 2016-10-07 LAB — POCT GLYCOSYLATED HEMOGLOBIN (HGB A1C): HEMOGLOBIN A1C: 5.6

## 2016-10-07 LAB — POCT URINALYSIS DIP (DEVICE)
Bilirubin Urine: NEGATIVE
Glucose, UA: NEGATIVE mg/dL
Hgb urine dipstick: NEGATIVE
Ketones, ur: NEGATIVE mg/dL
Leukocytes, UA: NEGATIVE
NITRITE: NEGATIVE
PH: 6 (ref 5.0–8.0)
PROTEIN: NEGATIVE mg/dL
Specific Gravity, Urine: 1.02 (ref 1.005–1.030)
Urobilinogen, UA: 0.2 mg/dL (ref 0.0–1.0)

## 2016-10-07 LAB — TSH: TSH: 1.91 mIU/L

## 2016-10-07 MED ORDER — MECLIZINE HCL 12.5 MG PO TABS
12.5000 mg | ORAL_TABLET | Freq: Three times a day (TID) | ORAL | 0 refills | Status: DC | PRN
Start: 2016-10-07 — End: 2016-10-09

## 2016-10-07 MED ORDER — ONDANSETRON 4 MG PO TBDP: 4.0000 mg | ORAL_TABLET | Freq: Three times a day (TID) | ORAL | 1 refills | Status: DC | PRN

## 2016-10-07 NOTE — Patient Instructions (Addendum)
Ondansetron 4  mg every 8 hours as needed for symptoms of dizziness/vertigo.   The patient was given clear instructions to go to ER or return to medical center if symptoms do not improve, worsen or new problems develop. The patient verbalized understanding. Will notify patient with laboratory results.   Dizziness Dizziness is a common problem. It is a feeling of unsteadiness or light-headedness. You may feel like you are about to faint. Dizziness can lead to injury if you stumble or fall. Anyone can become dizzy, but dizziness is more common in older adults. This condition can be caused by a number of things, including medicines, dehydration, or illness. Follow these instructions at home: Taking these steps may help with your condition: Eating and drinking  Drink enough fluid to keep your urine clear or pale yellow. This helps to keep you from becoming dehydrated. Try to drink more clear fluids, such as water.  Do not drink alcohol.  Limit your caffeine intake if directed by your health care provider.  Limit your salt intake if directed by your health care provider. Activity  Avoid making quick movements. ? Rise slowly from chairs and steady yourself until you feel okay. ? In the morning, first sit up on the side of the bed. When you feel okay, stand slowly while you hold onto something until you know that your balance is fine.  Move your legs often if you need to stand in one place for a long time. Tighten and relax your muscles in your legs while you are standing.  Do not drive or operate heavy machinery if you feel dizzy.  Avoid bending down if you feel dizzy. Place items in your home so that they are easy for you to reach without leaning over. Lifestyle  Do not use any tobacco products, including cigarettes, chewing tobacco, or electronic cigarettes. If you need help quitting, ask your health care provider.  Try to reduce your stress level, such as with yoga or meditation. Talk  with your health care provider if you need help. General instructions  Watch your dizziness for any changes.  Take medicines only as directed by your health care provider. Talk with your health care provider if you think that your dizziness is caused by a medicine that you are taking.  Tell a friend or a family member that you are feeling dizzy. If he or she notices any changes in your behavior, have this person call your health care provider.  Keep all follow-up visits as directed by your health care provider. This is important. Contact a health care provider if:  Your dizziness does not go away.  Your dizziness or light-headedness gets worse.  You feel nauseous.  You have reduced hearing.  You have new symptoms.  You are unsteady on your feet or you feel like the room is spinning. Get help right away if:  You vomit or have diarrhea and are unable to eat or drink anything.  You have problems talking, walking, swallowing, or using your arms, hands, or legs.  You feel generally weak.  You are not thinking clearly or you have trouble forming sentences. It may take a friend or family member to notice this.  You have chest pain, abdominal pain, shortness of breath, or sweating.  Your vision changes.  You notice any bleeding.  You have a headache.  You have neck pain or a stiff neck.  You have a fever. This information is not intended to replace advice given to you by  your health care provider. Make sure you discuss any questions you have with your health care provider. Document Released: 08/27/2000 Document Revised: 08/09/2015 Document Reviewed: 02/27/2014 Elsevier Interactive Patient Education  2017 [employer].

## 2016-10-08 LAB — HEMOGLOBIN A1C
HEMOGLOBIN A1C: 5.7 % — AB (ref <5.7)
Mean Plasma Glucose: 117 mg/dL

## 2016-10-09 ENCOUNTER — Emergency Department (HOSPITAL_COMMUNITY)
Admission: EM | Admit: 2016-10-09 | Discharge: 2016-10-09 | Disposition: A | Discharge status: Home / Self Care | Payer: 59 | Attending: Emergency Medicine | Admitting: Emergency Medicine

## 2016-10-09 ENCOUNTER — Encounter (HOSPITAL_COMMUNITY): Payer: Self-pay | Admitting: Emergency Medicine

## 2016-10-09 ENCOUNTER — Telehealth: Payer: Self-pay | Admitting: Family Medicine

## 2016-10-09 DIAGNOSIS — R110 Nausea: Secondary | ICD-10-CM | POA: Insufficient documentation

## 2016-10-09 DIAGNOSIS — R7303 Prediabetes: Secondary | ICD-10-CM | POA: Insufficient documentation

## 2016-10-09 DIAGNOSIS — R42 Dizziness and giddiness: Secondary | ICD-10-CM | POA: Diagnosis not present

## 2016-10-09 DIAGNOSIS — Z79899 Other long term (current) drug therapy: Secondary | ICD-10-CM | POA: Insufficient documentation

## 2016-10-09 DIAGNOSIS — I10 Essential (primary) hypertension: Secondary | ICD-10-CM | POA: Diagnosis not present

## 2016-10-09 LAB — BASIC METABOLIC PANEL
ANION GAP: 8 (ref 5–15)
BUN: 14 mg/dL (ref 6–20)
CHLORIDE: 105 mmol/L (ref 101–111)
CO2: 28 mmol/L (ref 22–32)
CREATININE: 0.83 mg/dL (ref 0.44–1.00)
Calcium: 9.8 mg/dL (ref 8.9–10.3)
GFR calc non Af Amer: >60 mL/min (ref >60)
Glucose, Bld: 94 mg/dL (ref 65–99)
Potassium: 3.9 mmol/L (ref 3.5–5.1)
SODIUM: 141 mmol/L (ref 135–145)

## 2016-10-09 LAB — URINALYSIS, ROUTINE W REFLEX MICROSCOPIC
Bacteria, UA: NONE SEEN
Bilirubin Urine: NEGATIVE
GLUCOSE, UA: NEGATIVE mg/dL
KETONES UR: NEGATIVE mg/dL
LEUKOCYTES UA: NEGATIVE
Nitrite: NEGATIVE
PH: 6 (ref 5.0–8.0)
Protein, ur: NEGATIVE mg/dL
Specific Gravity, Urine: 1.005 (ref 1.005–1.030)

## 2016-10-09 LAB — CBC WITH DIFFERENTIAL/PLATELET
BASOS ABS: 0 10*3/uL (ref 0.0–0.1)
BASOS PCT: 0 %
EOS ABS: 0.1 10*3/uL (ref 0.0–0.7)
Eosinophils Relative: 2 %
HEMATOCRIT: 40.2 % (ref 36.0–46.0)
HEMOGLOBIN: 13.6 g/dL (ref 12.0–15.0)
Lymphocytes Relative: 37 %
Lymphs Abs: 2.4 10*3/uL (ref 0.7–4.0)
MCH: 28.8 pg (ref 26.0–34.0)
MCHC: 33.8 g/dL (ref 30.0–36.0)
MCV: 85.2 fL (ref 78.0–100.0)
MONOS PCT: 8 %
Monocytes Absolute: 0.5 10*3/uL (ref 0.1–1.0)
NEUTROS ABS: 3.4 10*3/uL (ref 1.7–7.7)
NEUTROS PCT: 53 %
Platelets: 287 10*3/uL (ref 150–400)
RBC: 4.72 MIL/uL (ref 3.87–5.11)
RDW: 13.4 % (ref 11.5–15.5)
WBC: 6.4 10*3/uL (ref 4.0–10.5)

## 2016-10-09 LAB — I-STAT TROPONIN, ED: TROPONIN I, POC: 0 ng/mL (ref 0.00–0.08)

## 2016-10-09 MED ORDER — SODIUM CHLORIDE 0.9 % IV BOLUS (SEPSIS)
1000.0000 mL | Freq: Once | INTRAVENOUS | Status: AC
  Administered 2016-10-09: 1000 mL via INTRAVENOUS

## 2016-10-09 MED ORDER — MECLIZINE HCL 25 MG PO TABS: 25.0000 mg | ORAL_TABLET | Freq: Three times a day (TID) | ORAL | 0 refills | Status: DC | PRN

## 2016-10-09 MED ORDER — MECLIZINE HCL 25 MG PO TABS
25.0000 mg | ORAL_TABLET | Freq: Once | ORAL | Status: AC
  Administered 2016-10-09: 25 mg via ORAL
  Filled 2016-10-09: qty 1

## 2016-10-09 NOTE — Progress Notes (Signed)
Subjective:     Michaela Wilson is a 51 y.o. female with a history of prediabetes presents for evaluation of dizziness. The symptoms started a few days ago and are ongoing. She just recently returned from vacation in [place]. She noticed symptoms initially 2 days ago on awakening.  Positions that worsen symptoms are any motion, lying down and standing up.  Patient has no associated ear or CNS symptoms.  She also denies any recent infections, noise exposure, or new medications.  Past Medical History:  Diagnosis Date  . Cellulitis of right buttock 03/07/2015  . Hypertension    Social History   Social History  . Marital status: Divorced    Spouse name: N/A  . Number of children: N/A  . Years of education: N/A   Occupational History  . Not on file.   Social History Main Topics  . Smoking status: Never Smoker  . Smokeless tobacco: Never Used  . Alcohol use 0.0 oz/week     Comment: socially  . Drug use: No  . Sexual activity: Not Currently   Other Topics Concern  . Not on file   Social History Narrative  . No narrative on file   Immunization History  Administered Date(s) Administered  . Tdap 06/25/2015    Review of Systems Review of Systems  Constitutional: Positive for malaise/fatigue. Negative for weight loss.  HENT: Negative.  Negative for ear discharge, ear pain, hearing loss and tinnitus.   Eyes: Negative.   Respiratory: Negative.   Cardiovascular: Negative.  Negative for chest pain, palpitations, claudication, leg swelling and PND.  Gastrointestinal: Negative.   Genitourinary: Negative.   Musculoskeletal: Negative.   Skin: Negative.   Neurological: Positive for dizziness.  Endo/Heme/Allergies: Negative.   Psychiatric/Behavioral: Negative.      Objective:    BP 133/89   Pulse 84   Temp 98.7 F (37.1 C) (Oral)   Resp 16   Ht 5\' 2"  (1.575 m)   Wt 159 lb (72.1 kg)   SpO2 98%   BMI 29.08 kg/m   General Appearance:    Alert, cooperative, mild distress,  appears stated age  Head:    Normocephalic, without obvious abnormality, atraumatic  Eyes:    PERRL, conjunctiva/corneas clear, EOM's intact, fundi    benign, both eyes  Ears:    Normal TM's and external ear canals, both ears  Nose:   Nares normal, septum midline, mucosa normal, no drainage    or sinus tenderness  Throat:   Lips, mucosa, and tongue normal; teeth and gums normal  Neck:   Supple, symmetrical, trachea midline, no adenopathy;    thyroid:  no enlargement/tenderness/nodules; no carotid   bruit or JVD  Back:     Symmetric, no curvature, ROM normal, no CVA tenderness  Lungs:     Clear to auscultation bilaterally, respirations unlabored  Chest Wall:    No tenderness or deformity   Heart:    Regular rate and rhythm, S1 and S2 normal, no murmur, rub   or gallop  Abdomen:     Soft, non-tender, bowel sounds active all four quadrants,    no masses, no organomegaly  Extremities:   Extremities normal, atraumatic, no cyanosis or edema  Pulses:   2+ and symmetric all extremities  Skin:   Skin color, texture, turgor normal, no rashes or lesions  Lymph nodes:   Cervical, supraclavicular, and axillary nodes normal  Neurologic:   CNII-XII intact, normal strength, sensation and reflexes    throughout  Assessment:    Vertigo   Orthostatic vital signs unremarkable  Meclizine per medication orders. Educational materials given and questions answered. Plan:  1. Vertigo Insurance will not cover meclizine. Will start a trial of ondansetron 4 mg every 8 hours as needed for symptoms of vertigo. Reviewed EKG, Normal sinus rhythm  - EKG 12-Lead - Glucose (CBG) - POCT urinalysis dip (device) - meclizine (ANTIVERT) 12.5 MG tablet; Take 1 tablet (12.5 mg total) by mouth 3 (three) times daily as needed for dizziness.  Dispense: 30 tablet; Refill: 0 - ondansetron (ZOFRAN-ODT) 4 MG disintegrating tablet; Take 1 tablet (4 mg total) by mouth every 8 (eight) hours as needed for nausea or vomiting.   Dispense: 30 tablet; Refill: 1  2. Other fatigue Will follow up by phone with any abnormal results - EKG 12-Lead - CBC with Differential - TSH  3. Prediabetes - Hemoglobin A1c   RTC: 3 months for hypertension or as needed.   The patient was given clear instructions to go to ER or return to medical center if symptoms do not improve, worsen or new problems develop. The patient verbalized understanding. Will notify patient with laboratory results.   Donia Pounds  MSN, FNP-C [place] [address] [place], [place] [address] [phone]

## 2016-10-09 NOTE — ED Notes (Signed)
Discharge instructions reviewed with patient. Patient verbalizes understanding. VSS.   

## 2016-10-09 NOTE — Telephone Encounter (Signed)
Michaela Wilson notified office complaining of worsening dizziness. She reports holding onto walls for balance. I recommend that patient follow up in emergency department for further work up and evaluation.    Donia Pounds  MSN, FNP-C [place] [address] [place], [place] [address] [phone]

## 2016-10-09 NOTE — Discharge Instructions (Signed)
Im glad you are feeling better today.  As we discussed, your labs were normal. Continue the meclizine-- recommend 2 tabs of your 12.5mg  pills (for total of 25mg ) for now to finish up that prescription. Can start new script of the 25mg  pills when needed. Follow-up with your primary care doctor. Return to the ED for new or worsening symptoms.

## 2016-10-09 NOTE — ED Triage Notes (Signed)
Pt complaint of ongoing dizziness since Tuesday; PCP treated pt for vertigo; pt verbalizes associated nausea but denies other. Pt verbalizes worse with standing; generalized tremor noted with standing.

## 2016-10-09 NOTE — ED Notes (Signed)
Bed: WA04 Expected date:  Expected time:  Means of arrival:  Comments: 

## 2016-10-09 NOTE — ED Provider Notes (Signed)
[facility] DEPT Provider Note   CSN: [id number] Arrival date & time: 10/09/16  1109     History   Chief Complaint Chief Complaint  Patient presents with  . Dizziness    HPI Michaela Wilson is a 51 y.o. female.  The history is provided by the patient and medical records.  Dizziness  Associated symptoms: nausea      51 year old female with history of hypertension and prediabetes, presenting to the ED for dizziness/lightheadedness. Patient reports she has had intermittent episodes of this for about 4 days now. States she was seen by her doctor 2 days ago and was prescribed meclizine which she reports has helped somewhat, but results are very short lived. States symptoms are exacerbated with rapid movement or changing position such as from sitting to standing. States today at work she had an episode where she was walking and felt like "she was walking on air" and she felt like she might fall. She did not have any syncopal events or loss of consciousness. Her mother brought her here. While sitting states she feels okay but when she was rolled back to the room in the wheelchair and her mother spun her around she got very dizzy and felt her legs were "trembling". States now that she is sitting still in bed she feels fine again. She has no history of TIA or stroke. States she had an EKG done at her doctor's office 2 days ago which she was told was normal. She is not had any chest pain or shortness of breath. Has had some mild nausea but this is been controlled with Zofran. She's not had any vomiting, diaphoresis, confusion, numbness, weakness, tinnitus, severe headache, neck pain, fever, or chills. She is not currently on anticoagulation.  Past Medical History:  Diagnosis Date  . Cellulitis of right buttock 03/07/2015  . Hypertension     Patient Active Problem List   Diagnosis Date Noted  . Essential hypertension 06/25/2015  . Prediabetes 06/25/2015  . Sepsis ([place]) 03/07/2015  .  Leukocytosis 03/07/2015    Past Surgical History:  Procedure Laterality Date  . ABCESS DRAINAGE    . CHOLECYSTECTOMY    . TUBAL LIGATION      OB History    No data available       Home Medications    Prior to Admission medications   Medication Sig Start Date End Date Taking? Authorizing Provider  hydrochlorothiazide (HYDRODIURIL) 12.5 MG tablet Take 1 tablet (12.5 mg total) by mouth daily. 05/09/16   Dorena Dew, FNP  meclizine (ANTIVERT) 12.5 MG tablet Take 1 tablet (12.5 mg total) by mouth 3 (three) times daily as needed for dizziness. 10/07/16   Dorena Dew, FNP  ondansetron (ZOFRAN-ODT) 4 MG disintegrating tablet Take 1 tablet (4 mg total) by mouth every 8 (eight) hours as needed for nausea or vomiting. 10/07/16   Dorena Dew, FNP    Family History No family history on file.  Social History Social History  Substance Use Topics  . Smoking status: Never Smoker  . Smokeless tobacco: Never Used  . Alcohol use 0.0 oz/week     Comment: socially     Allergies   Patient has no known allergies.   Review of Systems Review of Systems  Gastrointestinal: Positive for nausea.  Neurological: Positive for dizziness.  All other systems reviewed and are negative.    Physical Exam Updated Vital Signs BP (!) 177/107 (BP Location: Left Arm)   Pulse 71   Temp 98.5  F (36.9 C) (Oral)   Resp 16   SpO2 100%   Physical Exam  Constitutional: She is oriented to person, place, and time. She appears well-developed and well-nourished.  HENT:  Head: Normocephalic and atraumatic.  Right Ear: Tympanic membrane and ear canal normal.  Left Ear: Tympanic membrane and ear canal normal.  Mouth/Throat: Uvula is midline, oropharynx is clear and moist and mucous membranes are normal.  EOMs fully intact, horizontal nystagmus noted, dizziness is reproduced with rapid head turn and rapid eye movements  Eyes: Pupils are equal, round, and reactive to light. Conjunctivae and EOM  are normal.  Neck: Normal range of motion.  Cardiovascular: Normal rate, regular rhythm and normal heart sounds.   Pulmonary/Chest: Effort normal and breath sounds normal. No respiratory distress. She has no wheezes.  Abdominal: Soft. Bowel sounds are normal. There is no tenderness. There is no rebound.  Musculoskeletal: Normal range of motion.  Neurological: She is alert and oriented to person, place, and time.  AAOx3, answering questions and following commands appropriately; equal strength UE and LE bilaterally; CN grossly intact; moves all extremities appropriately without ataxia; normal sensation throughout; no focal neuro deficits or facial asymmetry appreciated; speech is clear and goal oriented  Skin: Skin is warm and dry.  Psychiatric: She has a normal mood and affect.  Nursing note and vitals reviewed.    ED Treatments / Results  Labs (all labs ordered are listed, but only abnormal results are displayed) Labs Reviewed  URINALYSIS, ROUTINE W REFLEX MICROSCOPIC - Abnormal; Notable for the following:       Result Value   Color, Urine STRAW (*)    Hgb urine dipstick SMALL (*)    Squamous Epithelial / LPF 0-5 (*)    All other components within normal limits  CBC WITH DIFFERENTIAL/PLATELET  BASIC METABOLIC PANEL  I-STAT TROPONIN, ED    EKG  EKG Interpretation  Date/Time:  Thursday October 09 2016 11:18:50 EDT Ventricular Rate:  72 PR Interval:    QRS Duration: 80 QT Interval:  394 QTC Calculation: 432 R Axis:   56 Text Interpretation:  Sinus rhythm No STEMI.  Confirmed by Nanda Quinton [phone]) on 10/09/2016 11:54:03 AM       Radiology No results found.  Procedures Procedures (including critical care time)  Medications Ordered in ED Medications  sodium chloride 0.9 % bolus 1,000 mL (not administered)  meclizine (ANTIVERT) tablet 25 mg (not administered)     Initial Impression / Assessment and Plan / ED Course  I have reviewed the triage vital signs and the  nursing notes.  Pertinent labs & imaging results that were available during my care of the patient were reviewed by me and considered in my medical decision making (see chart for details).  51 year old female here with intermittent dizziness/lightheadedness for about 4 days now. She has had some improvement with meclizine, but reports it is very short-lived. When reviewing her bottle, she is only on very small dose of 12.5 mg. She is awake, alert, appropriately oriented. Her neurologic exam is nonfocal. She does have some horizontal nystagmus and her dizziness is reproduced with rapid eye movements. I do suspect this is peripheral vertigo. I have low suspicion for acute TIA or stroke. She has no significant headache, neck pain, or fever to suggest meningitis. We'll plan for screening labs, EKG. Patient given IV fluid bolus as well as high-dose meclizine.  1:07 PM Went to check on patient, and she was walking back from bathroom independently. States she is  feeling significantly better at this time. She denies any current dizziness or lightheadedness. She is ambulatory with a steady gait.  Her neurologic exam remains nonfocal. Discussed lab results which are reassuring. Continue to feel this is peripheral vertigo.  She seems to have much better response to the higher dose of meclizine so will discharge home with same. Encouraged good oral hydration at home. Follow-up with PCP as needed.  Discussed plan with patient, she acknowledged understanding and agreed with plan of care.  Return precautions given for new or worsening symptoms.  Final Clinical Impressions(s) / ED Diagnoses   Final diagnoses:  Dizziness  Vertigo    New Prescriptions New Prescriptions   MECLIZINE (ANTIVERT) 25 MG TABLET    Take 1 tablet (25 mg total) by mouth 3 (three) times daily as needed for dizziness.     Larene Pickett, PA-C 10/09/16 1330    Long, Wonda [facility], MD 10/09/16 [phone]

## 2016-11-06 ENCOUNTER — Ambulatory Visit: Payer: 59 | Admitting: Family Medicine

## 2016-12-08 ENCOUNTER — Ambulatory Visit: Payer: 59 | Admitting: Family Medicine

## 2016-12-16 ENCOUNTER — Other Ambulatory Visit: Payer: Self-pay | Admitting: Family Medicine

## 2016-12-16 DIAGNOSIS — I10 Essential (primary) hypertension: Secondary | ICD-10-CM

## 2017-02-19 ENCOUNTER — Encounter: Payer: Self-pay | Admitting: Family Medicine

## 2017-02-19 ENCOUNTER — Ambulatory Visit (INDEPENDENT_AMBULATORY_CARE_PROVIDER_SITE_OTHER): Payer: 59 | Admitting: Family Medicine

## 2017-02-19 VITALS — BP 148/88 | HR 92 | Temp 98.8°F | Resp 16 | Ht 62.0 in | Wt 160.0 lb

## 2017-02-19 DIAGNOSIS — I10 Essential (primary) hypertension: Secondary | ICD-10-CM

## 2017-02-19 DIAGNOSIS — M79602 Pain in left arm: Secondary | ICD-10-CM

## 2017-02-19 MED ORDER — KETOROLAC TROMETHAMINE 30 MG/ML IJ SOLN
30.0000 mg | Freq: Once | INTRAMUSCULAR | Status: AC
  Administered 2017-02-19: 30 mg via INTRAMUSCULAR

## 2017-02-19 MED ORDER — IBUPROFEN 600 MG PO TABS
600.0000 mg | ORAL_TABLET | Freq: Three times a day (TID) | ORAL | 0 refills | Status: DC | PRN
Start: 2017-02-19 — End: 2017-04-22

## 2017-02-19 NOTE — Progress Notes (Signed)
Subjective:    Patient ID: Michaela Wilson, female    DOB: [birth year], 51 y.o.   MRN: [id number]  HPI Michaela Wilson, a 51 year old female with a history of hypertension presents complaining of left arm pain. Patient states that she awakened several days ago with left arm pain. Pain is described as intermittent and aching. She denies previous arm injury or increased activity level. Current pain intensity is 5-6/10. She last had Ibuprofen and gabapentin on yesterday with maximum relief.   Patient also has a history of hypertension. Blood pressure has been controlled on hydrochlorothiazide 12.5 mg daily. She says that she has not been taking medication due to the fact that a friend told her that blood pressure medications were "bad for you" and cause cancer. Patient has not been exercising or following a low, sodium diet. She denies dizziness, chest pain, syncope, tachypnea, or heart palpitations.   Past Medical History:  Diagnosis Date  . Cellulitis of right buttock 03/07/2015  . Hypertension    Immunization History  Administered Date(s) Administered  . Tdap 06/25/2015   Social History   Socioeconomic History  . Marital status: Divorced    Spouse name: Not on file  . Number of children: Not on file  . Years of education: Not on file  . Highest education level: Not on file  Social Needs  . Financial resource strain: Not on file  . Food insecurity - worry: Not on file  . Food insecurity - inability: Not on file  . Transportation needs - medical: Not on file  . Transportation needs - non-medical: Not on file  Occupational History  . Not on file  Tobacco Use  . Smoking status: Never Smoker  . Smokeless tobacco: Never Used  Substance and Sexual Activity  . Alcohol use: Yes    Alcohol/week: 0.0 oz    Comment: socially  . Drug use: No  . Sexual activity: Not Currently  Other Topics Concern  . Not on file  Social History Narrative  . Not on file   Review of Systems   Constitutional: Negative.   HENT: Negative.   Eyes: Negative.   Respiratory: Negative.   Cardiovascular: Negative.   Endocrine: Negative.   Genitourinary: Negative.   Musculoskeletal: Positive for myalgias.  Skin: Negative.   Neurological: Negative.   Hematological: Negative.   Psychiatric/Behavioral: Negative.        Objective:   Physical Exam  Constitutional: She is oriented to person, place, and time. She appears well-nourished.  HENT:  Head: Normocephalic and atraumatic.  Right Ear: External ear normal.  Left Ear: External ear normal.  Nose: Nose normal.  Mouth/Throat: Oropharynx is clear and moist.  Eyes: Conjunctivae and EOM are normal. Pupils are equal, round, and reactive to light.  Neck: Normal range of motion. Neck supple.  Cardiovascular: Normal rate, regular rhythm, normal heart sounds and intact distal pulses.  Pulmonary/Chest: Effort normal and breath sounds normal.  Abdominal: Soft. Bowel sounds are normal.  Musculoskeletal:       Left elbow: She exhibits decreased range of motion. She exhibits no swelling. No tenderness found.  Neurological: She is alert and oriented to person, place, and time. She has normal reflexes.  Skin: Skin is warm and dry.  Psychiatric: She has a normal mood and affect. Her behavior is normal. Judgment and thought content normal.      BP (!) 148/88 Comment: manually  Pulse 92   Temp 98.8 F (37.1 C) (Oral)   Resp 16  Ht 5\' 2"  (1.575 m)   Wt 160 lb (72.6 kg)   SpO2 99%   BMI 29.26 kg/m  Assessment & Plan:   Arm pain, posterior, left - ketorolac (TORADOL) 30 MG/ML injection 30 mg - ibuprofen (ADVIL,MOTRIN) 600 MG tablet; Take 1 tablet (600 mg total) by mouth every 8 (eight) hours as needed.  Dispense: 30 tablet; Refill: 0 Essential hypertension Discussed the benefits of taking antihypertensive medication. Blood pressure was 164/102 on arrival. Decreased to 148/88 manually. Patient was advised to restart hydrochlorothiazide  12.5 mg daily.   We have discussed target BP range and blood pressure goal. I have advised patient to check BP regularly and to call us back or report to clinic if the numbers are consistently higher than 140/90. We discussed the importance of compliance with medical therapy and DASH diet recommended, consequences of uncontrolled hypertension discussed.  - continue current BP medications   RTC: 3 months for hypertension  Donia Pounds  MSN, FNP-C [facility] [address] [place], [place] [address] [phone]

## 2017-02-23 NOTE — Patient Instructions (Signed)
Hydrochlorothiazide, HCTZ capsules or tablets What is this medicine? HYDROCHLOROTHIAZIDE (hye droe klor oh THYE a zide) is a diuretic. It increases the amount of urine passed, which causes the body to lose salt and water. This medicine is used to treat high blood pressure. It is also reduces the swelling and water retention caused by various medical conditions, such as heart, liver, or kidney disease. This medicine may be used for other purposes; ask your health care provider or pharmacist if you have questions. COMMON BRAND NAME(S): Esidrix, Ezide, HydroDIURIL, Microzide, Oretic, Zide What should I tell my health care provider before I take this medicine? They need to know if you have any of these conditions: -diabetes -gout -immune system problems, like lupus -kidney disease or kidney stones -liver disease -pancreatitis -small amount of urine or difficulty passing urine -an unusual or allergic reaction to hydrochlorothiazide, sulfa drugs, other medicines, foods, dyes, or preservatives -pregnant or trying to get pregnant -breast-feeding How should I use this medicine? Take this medicine by mouth with a glass of water. Follow the directions on the prescription label. Take your medicine at regular intervals. Remember that you will need to pass urine frequently after taking this medicine. Do not take your doses at a time of day that will cause you problems. Do not stop taking your medicine unless your doctor tells you to. Talk to your pediatrician regarding the use of this medicine in children. Special care may be needed. Overdosage: If you think you have taken too much of this medicine contact a poison control center or emergency room at once. NOTE: This medicine is only for you. Do not share this medicine with others. What if I miss a dose? If you miss a dose, take it as soon as you can. If it is almost time for your next dose, take only that dose. Do not take double or extra doses. What may  interact with this medicine? -cholestyramine -colestipol -digoxin -dofetilide -lithium -medicines for blood pressure -medicines for diabetes -medicines that relax muscles for surgery -other diuretics -steroid medicines like prednisone or cortisone This list may not describe all possible interactions. Give your health care provider a list of all the medicines, herbs, non-prescription drugs, or dietary supplements you use. Also tell them if you smoke, drink alcohol, or use illegal drugs. Some items may interact with your medicine. What should I watch for while using this medicine? Visit your doctor or health care professional for regular checks on your progress. Check your blood pressure as directed. Ask your doctor or health care professional what your blood pressure should be and when you should contact him or her. You may need to be on a special diet while taking this medicine. Ask your doctor. Check with your doctor or health care professional if you get an attack of severe diarrhea, nausea and vomiting, or if you sweat a lot. The loss of too much body fluid can make it dangerous for you to take this medicine. You may get drowsy or dizzy. Do not drive, use machinery, or do anything that needs mental alertness until you know how this medicine affects you. Do not stand or sit up quickly, especially if you are an older patient. This reduces the risk of dizzy or fainting spells. Alcohol may interfere with the effect of this medicine. Avoid alcoholic drinks. This medicine may affect your blood sugar level. If you have diabetes, check with your doctor or health care professional before changing the dose of your diabetic medicine. This medicine  can make you more sensitive to the sun. Keep out of the sun. If you cannot avoid being in the sun, wear protective clothing and use sunscreen. Do not use sun lamps or tanning beds/booths. What side effects may I notice from receiving this medicine? Side effects  that you should report to your doctor or health care professional as soon as possible: -allergic reactions such as skin rash or itching, hives, swelling of the lips, mouth, tongue, or throat -changes in vision -chest pain -eye pain -fast or irregular heartbeat -feeling faint or lightheaded, falls -gout attack -muscle pain or cramps -pain or difficulty when passing urine -pain, tingling, numbness in the hands or feet -redness, blistering, peeling or loosening of the skin, including inside the mouth -unusually weak or tired Side effects that usually do not require medical attention (report to your doctor or health care professional if they continue or are bothersome): -change in sex drive or performance -dry mouth -headache -stomach upset This list may not describe all possible side effects. Call your doctor for medical advice about side effects. You may report side effects to FDA at 1-800-FDA-1088. Where should I keep my medicine? Keep out of the reach of children. Store at room temperature between 15 and 30 degrees C (59 and 86 degrees F). Do not freeze. Protect from light and moisture. Keep container closed tightly. Throw away any unused medicine after the expiration date. NOTE: This sheet is a summary. It may not cover all possible information. If you have questions about this medicine, talk to your doctor, pharmacist, or health care provider.  2018 Elsevier/Gold Standard (2009-10-26 12:57:37) DASH Eating Plan DASH stands for "Dietary Approaches to Stop Hypertension." The DASH eating plan is a healthy eating plan that has been shown to reduce high blood pressure (hypertension). It may also reduce your risk for type 2 diabetes, heart disease, and stroke. The DASH eating plan may also help with weight loss. What are tips for following this plan? General guidelines  Avoid eating more than 2,300 mg (milligrams) of salt (sodium) a day. If you have hypertension, you may need to reduce your  sodium intake to 1,500 mg a day.  Limit alcohol intake to no more than 1 drink a day for nonpregnant women and 2 drinks a day for men. One drink equals 12 oz of beer, 5 oz of wine, or 1 oz of hard liquor.  Work with your health care provider to maintain a healthy body weight or to lose weight. Ask what an ideal weight is for you.  Get at least 30 minutes of exercise that causes your heart to beat faster (aerobic exercise) most days of the week. Activities may include walking, swimming, or biking.  Work with your health care provider or diet and nutrition specialist (dietitian) to adjust your eating plan to your individual calorie needs. Reading food labels  Check food labels for the amount of sodium per serving. Choose foods with less than 5 percent of the Daily Value of sodium. Generally, foods with less than 300 mg of sodium per serving fit into this eating plan.  To find whole grains, look for the word "whole" as the first word in the ingredient list. Shopping  Buy products labeled as "low-sodium" or "no salt added."  Buy fresh foods. Avoid canned foods and premade or frozen meals. Cooking  Avoid adding salt when cooking. Use salt-free seasonings or herbs instead of table salt or sea salt. Check with your health care provider or pharmacist before using  salt substitutes.  Do not fry foods. Cook foods using healthy methods such as baking, boiling, grilling, and broiling instead.  Cook with heart-healthy oils, such as olive, canola, soybean, or sunflower oil. Meal planning   Eat a balanced diet that includes: ? 5 or more servings of fruits and vegetables each day. At each meal, try to fill half of your plate with fruits and vegetables. ? Up to 6-8 servings of whole grains each day. ? Less than 6 oz of lean meat, poultry, or fish each day. A 3-oz serving of meat is about the same size as a deck of cards. One egg equals 1 oz. ? 2 servings of low-fat dairy each day. ? A serving of  nuts, seeds, or beans 5 times each week. ? Heart-healthy fats. Healthy fats called Omega-3 fatty acids are found in foods such as flaxseeds and coldwater fish, like sardines, salmon, and mackerel.  Limit how much you eat of the following: ? Canned or prepackaged foods. ? Food that is high in trans fat, such as fried foods. ? Food that is high in saturated fat, such as fatty meat. ? Sweets, desserts, sugary drinks, and other foods with added sugar. ? Full-fat dairy products.  Do not salt foods before eating.  Try to eat at least 2 vegetarian meals each week.  Eat more home-cooked food and less restaurant, buffet, and fast food.  When eating at a restaurant, ask that your food be prepared with less salt or no salt, if possible. What foods are recommended? The items listed may not be a complete list. Talk with your dietitian about what dietary choices are best for you. Grains Whole-grain or whole-wheat bread. Whole-grain or whole-wheat pasta. Brown rice. [place]. Bulgur. Whole-grain and low-sodium cereals. Pita bread. Low-fat, low-sodium crackers. Whole-wheat flour tortillas. Vegetables Fresh or frozen vegetables (raw, steamed, roasted, or grilled). Low-sodium or reduced-sodium tomato and vegetable juice. Low-sodium or reduced-sodium tomato sauce and tomato paste. Low-sodium or reduced-sodium canned vegetables. Fruits All fresh, dried, or frozen fruit. Canned fruit in natural juice (without added sugar). Meat and other protein foods Skinless chicken or [place]. Ground chicken or [place]. Pork with fat trimmed off. Fish and seafood. Egg whites. Dried beans, peas, or lentils. Unsalted nuts, nut butters, and seeds. Unsalted canned beans. Lean cuts of beef with fat trimmed off. Low-sodium, lean deli meat. Dairy Low-fat (1%) or fat-free (skim) milk. Fat-free, low-fat, or reduced-fat cheeses. Nonfat, low-sodium ricotta or cottage cheese. Low-fat or nonfat yogurt. Low-fat, low-sodium  cheese. Fats and oils Soft margarine without trans fats. Vegetable oil. Low-fat, reduced-fat, or light mayonnaise and salad dressings (reduced-sodium). Canola, safflower, olive, soybean, and sunflower oils. Avocado. Seasoning and other foods Herbs. Spices. Seasoning mixes without salt. Unsalted popcorn and pretzels. Fat-free sweets. What foods are not recommended? The items listed may not be a complete list. Talk with your dietitian about what dietary choices are best for you. Grains Baked goods made with fat, such as croissants, muffins, or some breads. Dry pasta or rice meal packs. Vegetables Creamed or fried vegetables. Vegetables in a cheese sauce. Regular canned vegetables (not low-sodium or reduced-sodium). Regular canned tomato sauce and paste (not low-sodium or reduced-sodium). Regular tomato and vegetable juice (not low-sodium or reduced-sodium). Angie Fava. Olives. Fruits Canned fruit in a light or heavy syrup. Fried fruit. Fruit in cream or butter sauce. Meat and other protein foods Fatty cuts of meat. Ribs. Fried meat. Berniece Salines. Sausage. Bologna and other processed lunch meats. Salami. Fatback. Hotdogs. Bratwurst. Salted nuts and  seeds. Canned beans with added salt. Canned or smoked fish. Whole eggs or egg yolks. Chicken or [place] with skin. Dairy Whole or 2% milk, cream, and half-and-half. Whole or full-fat cream cheese. Whole-fat or sweetened yogurt. Full-fat cheese. Nondairy creamers. Whipped toppings. Processed cheese and cheese spreads. Fats and oils Butter. Stick margarine. Lard. Shortening. Ghee. Bacon fat. Tropical oils, such as coconut, palm kernel, or palm oil. Seasoning and other foods Salted popcorn and pretzels. Onion salt, garlic salt, seasoned salt, table salt, and sea salt. Worcestershire sauce. Tartar sauce. Barbecue sauce. Teriyaki sauce. Soy sauce, including reduced-sodium. Steak sauce. Canned and packaged gravies. Fish sauce. Oyster sauce. Cocktail sauce. Horseradish  that you find on the shelf. Ketchup. Mustard. Meat flavorings and tenderizers. Bouillon cubes. Hot sauce and Tabasco sauce. Premade or packaged marinades. Premade or packaged taco seasonings. Relishes. Regular salad dressings. Where to find more information:  National Heart, Lung, and [place]: https://wilson-eaton.com/  American Heart Association: www.heart.org Summary  The DASH eating plan is a healthy eating plan that has been shown to reduce high blood pressure (hypertension). It may also reduce your risk for type 2 diabetes, heart disease, and stroke.  With the DASH eating plan, you should limit salt (sodium) intake to 2,300 mg a day. If you have hypertension, you may need to reduce your sodium intake to 1,500 mg a day.  When on the DASH eating plan, aim to eat more fresh fruits and vegetables, whole grains, lean proteins, low-fat dairy, and heart-healthy fats.  Work with your health care provider or diet and nutrition specialist (dietitian) to adjust your eating plan to your individual calorie needs. This information is not intended to replace advice given to you by your health care provider. Make sure you discuss any questions you have with your health care provider. Document Released: 02/20/2011 Document Revised: 02/25/2016 Document Reviewed: 02/25/2016 Elsevier Interactive Patient Education  2017 [employer].

## 2017-04-22 ENCOUNTER — Encounter: Payer: Self-pay | Admitting: Family Medicine

## 2017-04-22 ENCOUNTER — Ambulatory Visit: Payer: 59 | Admitting: Family Medicine

## 2017-04-22 ENCOUNTER — Ambulatory Visit (INDEPENDENT_AMBULATORY_CARE_PROVIDER_SITE_OTHER): Payer: 59 | Admitting: Family Medicine

## 2017-04-22 VITALS — BP 124/68 | HR 82 | Temp 98.0°F | Resp 18 | Ht 62.0 in | Wt 155.2 lb

## 2017-04-22 DIAGNOSIS — Z1211 Encounter for screening for malignant neoplasm of colon: Secondary | ICD-10-CM

## 2017-04-22 DIAGNOSIS — I10 Essential (primary) hypertension: Secondary | ICD-10-CM

## 2017-04-22 DIAGNOSIS — Z1231 Encounter for screening mammogram for malignant neoplasm of breast: Secondary | ICD-10-CM

## 2017-04-22 DIAGNOSIS — Z1239 Encounter for other screening for malignant neoplasm of breast: Secondary | ICD-10-CM

## 2017-04-22 NOTE — Patient Instructions (Signed)
DASH Eating Plan DASH stands for "Dietary Approaches to Stop Hypertension." The DASH eating plan is a healthy eating plan that has been shown to reduce high blood pressure (hypertension). It may also reduce your risk for type 2 diabetes, heart disease, and stroke. The DASH eating plan may also help with weight loss. What are tips for following this plan? General guidelines  Avoid eating more than 2,300 mg (milligrams) of salt (sodium) a day. If you have hypertension, you may need to reduce your sodium intake to 1,500 mg a day.  Limit alcohol intake to no more than 1 drink a day for nonpregnant women and 2 drinks a day for men. One drink equals 12 oz of beer, 5 oz of wine, or 1 oz of hard liquor.  Work with your health care provider to maintain a healthy body weight or to lose weight. Ask what an ideal weight is for you.  Get at least 30 minutes of exercise that causes your heart to beat faster (aerobic exercise) most days of the week. Activities may include walking, swimming, or biking.  Work with your health care provider or diet and nutrition specialist (dietitian) to adjust your eating plan to your individual calorie needs. Reading food labels  Check food labels for the amount of sodium per serving. Choose foods with less than 5 percent of the Daily Value of sodium. Generally, foods with less than 300 mg of sodium per serving fit into this eating plan.  To find whole grains, look for the word "whole" as the first word in the ingredient list. Shopping  Buy products labeled as "low-sodium" or "no salt added."  Buy fresh foods. Avoid canned foods and premade or frozen meals. Cooking  Avoid adding salt when cooking. Use salt-free seasonings or herbs instead of table salt or sea salt. Check with your health care provider or pharmacist before using salt substitutes.  Do not fry foods. Cook foods using healthy methods such as baking, boiling, grilling, and broiling instead.  Cook with  heart-healthy oils, such as olive, canola, soybean, or sunflower oil. Meal planning   Eat a balanced diet that includes: ? 5 or more servings of fruits and vegetables each day. At each meal, try to fill half of your plate with fruits and vegetables. ? Up to 6-8 servings of whole grains each day. ? Less than 6 oz of lean meat, poultry, or fish each day. A 3-oz serving of meat is about the same size as a deck of cards. One egg equals 1 oz. ? 2 servings of low-fat dairy each day. ? A serving of nuts, seeds, or beans 5 times each week. ? Heart-healthy fats. Healthy fats called Omega-3 fatty acids are found in foods such as flaxseeds and coldwater fish, like sardines, salmon, and mackerel.  Limit how much you eat of the following: ? Canned or prepackaged foods. ? Food that is high in trans fat, such as fried foods. ? Food that is high in saturated fat, such as fatty meat. ? Sweets, desserts, sugary drinks, and other foods with added sugar. ? Full-fat dairy products.  Do not salt foods before eating.  Try to eat at least 2 vegetarian meals each week.  Eat more home-cooked food and less restaurant, buffet, and fast food.  When eating at a restaurant, ask that your food be prepared with less salt or no salt, if possible. What foods are recommended? The items listed may not be a complete list. Talk with your dietitian about what   dietary choices are best for you. Grains Whole-grain or whole-wheat bread. Whole-grain or whole-wheat pasta. Brown rice. Oatmeal. Quinoa. Bulgur. Whole-grain and low-sodium cereals. Pita bread. Low-fat, low-sodium crackers. Whole-wheat flour tortillas. Vegetables Fresh or frozen vegetables (raw, steamed, roasted, or grilled). Low-sodium or reduced-sodium tomato and vegetable juice. Low-sodium or reduced-sodium tomato sauce and tomato paste. Low-sodium or reduced-sodium canned vegetables. Fruits All fresh, dried, or frozen fruit. Canned fruit in natural juice (without  added sugar). Meat and other protein foods Skinless chicken or turkey. Ground chicken or turkey. Pork with fat trimmed off. Fish and seafood. Egg whites. Dried beans, peas, or lentils. Unsalted nuts, nut butters, and seeds. Unsalted canned beans. Lean cuts of beef with fat trimmed off. Low-sodium, lean deli meat. Dairy Low-fat (1%) or fat-free (skim) milk. Fat-free, low-fat, or reduced-fat cheeses. Nonfat, low-sodium ricotta or cottage cheese. Low-fat or nonfat yogurt. Low-fat, low-sodium cheese. Fats and oils Soft margarine without trans fats. Vegetable oil. Low-fat, reduced-fat, or light mayonnaise and salad dressings (reduced-sodium). Canola, safflower, olive, soybean, and sunflower oils. Avocado. Seasoning and other foods Herbs. Spices. Seasoning mixes without salt. Unsalted popcorn and pretzels. Fat-free sweets. What foods are not recommended? The items listed may not be a complete list. Talk with your dietitian about what dietary choices are best for you. Grains Baked goods made with fat, such as croissants, muffins, or some breads. Dry pasta or rice meal packs. Vegetables Creamed or fried vegetables. Vegetables in a cheese sauce. Regular canned vegetables (not low-sodium or reduced-sodium). Regular canned tomato sauce and paste (not low-sodium or reduced-sodium). Regular tomato and vegetable juice (not low-sodium or reduced-sodium). Pickles. Olives. Fruits Canned fruit in a light or heavy syrup. Fried fruit. Fruit in cream or butter sauce. Meat and other protein foods Fatty cuts of meat. Ribs. Fried meat. Bacon. Sausage. Bologna and other processed lunch meats. Salami. Fatback. Hotdogs. Bratwurst. Salted nuts and seeds. Canned beans with added salt. Canned or smoked fish. Whole eggs or egg yolks. Chicken or turkey with skin. Dairy Whole or 2% milk, cream, and half-and-half. Whole or full-fat cream cheese. Whole-fat or sweetened yogurt. Full-fat cheese. Nondairy creamers. Whipped toppings.  Processed cheese and cheese spreads. Fats and oils Butter. Stick margarine. Lard. Shortening. Ghee. Bacon fat. Tropical oils, such as coconut, palm kernel, or palm oil. Seasoning and other foods Salted popcorn and pretzels. Onion salt, garlic salt, seasoned salt, table salt, and sea salt. Worcestershire sauce. Tartar sauce. Barbecue sauce. Teriyaki sauce. Soy sauce, including reduced-sodium. Steak sauce. Canned and packaged gravies. Fish sauce. Oyster sauce. Cocktail sauce. Horseradish that you find on the shelf. Ketchup. Mustard. Meat flavorings and tenderizers. Bouillon cubes. Hot sauce and Tabasco sauce. Premade or packaged marinades. Premade or packaged taco seasonings. Relishes. Regular salad dressings. Where to find more information:  National Heart, Lung, and Blood Institute: www.nhlbi.nih.gov  American Heart Association: www.heart.org Summary  The DASH eating plan is a healthy eating plan that has been shown to reduce high blood pressure (hypertension). It may also reduce your risk for type 2 diabetes, heart disease, and stroke.  With the DASH eating plan, you should limit salt (sodium) intake to 2,300 mg a day. If you have hypertension, you may need to reduce your sodium intake to 1,500 mg a day.  When on the DASH eating plan, aim to eat more fresh fruits and vegetables, whole grains, lean proteins, low-fat dairy, and heart-healthy fats.  Work with your health care provider or diet and nutrition specialist (dietitian) to adjust your eating plan to your individual   calorie needs. This information is not intended to replace advice given to you by your health care provider. Make sure you discuss any questions you have with your health care provider. Document Released: 02/20/2011 Document Revised: 02/25/2016 Document Reviewed: 02/25/2016 Elsevier Interactive Patient Education  2018 [employer]  

## 2017-04-22 NOTE — Progress Notes (Signed)
Subjective:    Patient ID: Michaela Wilson, female    DOB: 1966/01/01, 52 y.o.   MRN: [id number]  HPI Michaela Wilson, a 52 year old female with a history of hypertension presents for a 3 month follow up. Blood pressure has been controlled on hydrochlorothiazide 12.5 mg daily.  Patient has been taking all prescribed medications consistently.  She has been exercising routinely and following a low-fat, low-sodium diet.  She denies dizziness, chest pain, syncope, tachypnea, or heart palpitations.  Patient states that she feels well and is without complaint.   Past Medical History:  Diagnosis Date  . Cellulitis of right buttock 03/07/2015  . Hypertension    Immunization History  Administered Date(s) Administered  . Tdap 06/25/2015   Social History   Socioeconomic History  . Marital status: Divorced    Spouse name: Not on file  . Number of children: Not on file  . Years of education: Not on file  . Highest education level: Not on file  Social Needs  . Financial resource strain: Not on file  . Food insecurity - worry: Not on file  . Food insecurity - inability: Not on file  . Transportation needs - medical: Not on file  . Transportation needs - non-medical: Not on file  Occupational History  . Not on file  Tobacco Use  . Smoking status: Never Smoker  . Smokeless tobacco: Never Used  Substance and Sexual Activity  . Alcohol use: Yes    Alcohol/week: 0.0 oz    Comment: socially  . Drug use: No  . Sexual activity: Not Currently  Other Topics Concern  . Not on file  Social History Narrative  . Not on file   Immunization History  Administered Date(s) Administered  . Tdap 06/25/2015   Review of Systems  Constitutional: Negative.   HENT: Negative.   Eyes: Negative.   Respiratory: Negative.   Cardiovascular: Negative.   Endocrine: Negative.   Genitourinary: Negative.   Musculoskeletal: Negative for arthralgias and joint swelling.  Skin: Negative.   Neurological: Negative.    Hematological: Negative.   Psychiatric/Behavioral: Negative.        Objective:   Physical Exam  Constitutional: She is oriented to person, place, and time. She appears well-nourished.  HENT:  Head: Normocephalic and atraumatic.  Right Ear: External ear normal.  Left Ear: External ear normal.  Nose: Nose normal.  Mouth/Throat: Oropharynx is clear and moist.  Eyes: Conjunctivae and EOM are normal. Pupils are equal, round, and reactive to light.  Neck: Normal range of motion. Neck supple.  Cardiovascular: Normal rate, regular rhythm, normal heart sounds and intact distal pulses.  Pulmonary/Chest: Effort normal and breath sounds normal.  Abdominal: Soft. Bowel sounds are normal.  Musculoskeletal:       Left elbow: She exhibits no swelling. No tenderness found.  Neurological: She is alert and oriented to person, place, and time. She has normal reflexes.  Skin: Skin is warm and dry.  Psychiatric: She has a normal mood and affect. Her behavior is normal. Judgment and thought content normal.      BP 124/68 (BP Location: Left Arm, Cuff Size: Normal)   Pulse 82   Temp 98 F (36.7 C)   Resp 18   Ht 5\' 2"  (1.575 m)   Wt 155 lb 3.2 oz (70.4 kg)   SpO2 99%   BMI 28.39 kg/m  Assessment & Plan:   1. Essential hypertension Blood pressure is at goal on current medication regimen, no changes warranted.  -  Continue medication, monitor blood pressure at home. Continue DASH diet. Reminder to go to the ER if any CP, SOB, nausea, dizziness, severe HA, changes vision/speech, left arm numbness and tingling and jaw pain.  Continue current exercise plan  - Basic Metabolic Panel  2. Breast cancer screening - MM Digital Screening; Future  3. Screening for colon cancer - Ambulatory referral to Gastroenterology   RTC: 6 months for hypertension   Donia Pounds  MSN, FNP-C [facility] [address] [place], [place] [address] [phone]

## 2017-04-23 ENCOUNTER — Telehealth: Payer: Self-pay

## 2017-04-23 ENCOUNTER — Encounter: Payer: Self-pay | Admitting: Gastroenterology

## 2017-04-23 LAB — BASIC METABOLIC PANEL
BUN/Creatinine Ratio: 9 (ref 9–23)
BUN: 9 mg/dL (ref 6–24)
CO2: 22 mmol/L (ref 20–29)
CREATININE: 0.95 mg/dL (ref 0.57–1.00)
Calcium: 9.8 mg/dL (ref 8.7–10.2)
Chloride: 101 mmol/L (ref 96–106)
GFR calc Af Amer: 81 mL/min/{1.73_m2} (ref >59)
GFR calc non Af Amer: 70 mL/min/{1.73_m2} (ref >59)
GLUCOSE: 94 mg/dL (ref 65–99)
Potassium: 4.2 mmol/L (ref 3.5–5.2)
SODIUM: 138 mmol/L (ref 134–144)

## 2017-04-23 LAB — POCT URINALYSIS DIP (DEVICE)
Bilirubin Urine: NEGATIVE
GLUCOSE, UA: NEGATIVE mg/dL
Hgb urine dipstick: NEGATIVE
Ketones, ur: NEGATIVE mg/dL
LEUKOCYTES UA: NEGATIVE
Nitrite: NEGATIVE
Protein, ur: NEGATIVE mg/dL
SPECIFIC GRAVITY, URINE: 1.02 (ref 1.005–1.030)
UROBILINOGEN UA: 0.2 mg/dL (ref 0.0–1.0)
pH: 5.5 (ref 5.0–8.0)

## 2017-04-23 NOTE — Telephone Encounter (Signed)
-----   Message from Dorena Dew, [employer] sent at 04/23/2017  6:10 AM EST ----- Regarding: lab results Please inform patient that laboratory results are within a normal range. Continue low dose diuretic, Hydrochlorothizide 12.5 mg as prescribed. Follow up in 6 months   Thanks

## 2017-04-23 NOTE — Telephone Encounter (Signed)
Patient notified of results.

## 2017-06-05 ENCOUNTER — Ambulatory Visit (AMBULATORY_SURGERY_CENTER): Payer: Self-pay | Admitting: *Deleted

## 2017-06-05 ENCOUNTER — Other Ambulatory Visit: Payer: Self-pay

## 2017-06-05 ENCOUNTER — Encounter: Payer: Self-pay | Admitting: Gastroenterology

## 2017-06-05 VITALS — Ht 62.0 in | Wt 155.6 lb

## 2017-06-05 DIAGNOSIS — Z1211 Encounter for screening for malignant neoplasm of colon: Secondary | ICD-10-CM

## 2017-06-05 MED ORDER — NA SULFATE-K SULFATE-MG SULF 17.5-3.13-1.6 GM/177ML PO SOLN: 1.0000 | Freq: Once | ORAL | 0 refills | Status: AC

## 2017-06-05 NOTE — Progress Notes (Signed)
Denies allergies to eggs or soy products. Denies complications with sedation or anesthesia. Denies O2 use. Denies use of diet or weight loss medications.  Emmi instructions given for colonoscopy.  

## 2017-06-19 ENCOUNTER — Encounter: Payer: Self-pay | Admitting: Gastroenterology

## 2017-06-19 ENCOUNTER — Other Ambulatory Visit: Payer: Self-pay

## 2017-06-19 ENCOUNTER — Ambulatory Visit (AMBULATORY_SURGERY_CENTER): Payer: 59 | Admitting: Gastroenterology

## 2017-06-19 VITALS — BP 141/81 | HR 62 | Temp 96.9°F | Resp 14 | Ht 62.0 in | Wt 155.0 lb

## 2017-06-19 DIAGNOSIS — Z1211 Encounter for screening for malignant neoplasm of colon: Secondary | ICD-10-CM

## 2017-06-19 DIAGNOSIS — K635 Polyp of colon: Secondary | ICD-10-CM

## 2017-06-19 DIAGNOSIS — D125 Benign neoplasm of sigmoid colon: Secondary | ICD-10-CM

## 2017-06-19 MED ORDER — SODIUM CHLORIDE 0.9 % IV SOLN: 500.0000 mL | Freq: Once | INTRAVENOUS | Status: AC

## 2017-06-19 NOTE — Op Note (Signed)
[facility] Patient Name: Michaela Wilson Procedure Date: 06/19/2017 10:25 AM MRN: [id number] Endoscopist: Mauri Pole , MD Age: 52 Referring MD:  Date of Birth: 1966/03/16 Gender: Female Account #: [id number] Procedure:                Colonoscopy Indications:              Screening for colorectal malignant neoplasm, This                            is the patient's first colonoscopy Medicines:                Monitored Anesthesia Care Procedure:                Pre-Anesthesia Assessment:                           - Prior to the procedure, a History and Physical                            was performed, and patient medications and                            allergies were reviewed. The patient's tolerance of                            previous anesthesia was also reviewed. The risks                            and benefits of the procedure and the sedation                            options and risks were discussed with the patient.                            All questions were answered, and informed consent                            was obtained. Prior Anticoagulants: The patient has                            taken no previous anticoagulant or antiplatelet                            agents. ASA Grade Assessment: II - A patient with                            mild systemic disease. After reviewing the risks                            and benefits, the patient was deemed in                            satisfactory condition to undergo the procedure.  After obtaining informed consent, the colonoscope                            was passed under direct vision. Throughout the                            procedure, the patient's blood pressure, pulse, and                            oxygen saturations were monitored continuously. The                            Model PCF-[id number]) scope was introduced                            through the anus and  advanced to the the cecum,                            identified by appendiceal orifice and ileocecal                            valve. The colonoscopy was performed without                            difficulty. The patient tolerated the procedure                            well. The quality of the bowel preparation was                            good. The ileocecal valve, appendiceal orifice, and                            rectum were photographed. Scope In: 10:34:39 AM Scope Out: 10:47:22 AM Scope Withdrawal Time: 0 hours 8 minutes 53 seconds  Total Procedure Duration: 0 hours 12 minutes 43 seconds  Findings:                 The perianal and digital rectal examinations were                            normal.                           A 1 mm polyp was found in the sigmoid colon. The                            polyp was sessile. The polyp was removed with a                            cold biopsy forceps. Resection and retrieval were                            complete.  A few small-mouthed diverticula were found in the                            sigmoid colon and ascending colon.                           Non-bleeding internal hemorrhoids were found during                            retroflexion. The hemorrhoids were medium-sized.                           The exam was otherwise without abnormality. Complications:            No immediate complications. Estimated Blood Loss:     Estimated blood loss was minimal. Impression:               - One 1 mm polyp in the sigmoid colon, removed with                            a cold biopsy forceps. Resected and retrieved.                           - Mild diverticulosis in the sigmoid colon and in                            the ascending colon.                           - Non-bleeding internal hemorrhoids.                           - The examination was otherwise normal. Recommendation:           - Patient has a contact  number available for                            emergencies. The signs and symptoms of potential                            delayed complications were discussed with the                            patient. Return to normal activities tomorrow.                            Written discharge instructions were provided to the                            patient.                           - Resume previous diet.                           - Continue present medications.                           -  Await pathology results.                           - Repeat colonoscopy in 5-10 years for surveillance                            based on pathology results. Mauri Pole, MD 06/19/2017 10:54:19 AM This report has been signed electronically.

## 2017-06-19 NOTE — Progress Notes (Signed)
Pt's states no medical or surgical changes since previsit or office visit. 

## 2017-06-19 NOTE — Progress Notes (Signed)
Called to room to assist during endoscopic procedure.  Patient ID and intended procedure confirmed with present staff. Received instructions for my participation in the procedure from the performing physician.  

## 2017-06-19 NOTE — Patient Instructions (Signed)
Impression/Recommendations:  Polyp handout given to patient. Diverticulosis handout given to patient. Hemorrhoid handout given to patient.  Resume previous diet. Continue present medications.  Await pathology results.  Repeat colonoscopy in 5-10 years for surveillance.  Date to be determined based on pathology results.  YOU HAD AN ENDOSCOPIC PROCEDURE TODAY AT [facility]:   Refer to the procedure report that was given to you for any specific questions about what was found during the examination.  If the procedure report does not answer your questions, please call your gastroenterologist to clarify.  If you requested that your care partner not be given the details of your procedure findings, then the procedure report has been included in a sealed envelope for you to review at your convenience later.  YOU SHOULD EXPECT: Some feelings of bloating in the abdomen. Passage of more gas than usual.  Walking can help get rid of the air that was put into your GI tract during the procedure and reduce the bloating. If you had a lower endoscopy (such as a colonoscopy or flexible sigmoidoscopy) you may notice spotting of blood in your stool or on the toilet paper. If you underwent a bowel prep for your procedure, you may not have a normal bowel movement for a few days.  Please Note:  You might notice some irritation and congestion in your nose or some drainage.  This is from the oxygen used during your procedure.  There is no need for concern and it should clear up in a day or so.  SYMPTOMS TO REPORT IMMEDIATELY:   Following lower endoscopy (colonoscopy or flexible sigmoidoscopy):  Excessive amounts of blood in the stool  Significant tenderness or worsening of abdominal pains  Swelling of the abdomen that is new, acute  Fever of 100F or higher  For urgent or emergent issues, a gastroenterologist can be reached at any hour by calling [phone].   DIET:  We do recommend a small  meal at first, but then you may proceed to your regular diet.  Drink plenty of fluids but you should avoid alcoholic beverages for 24 hours.  ACTIVITY:  You should plan to take it easy for the rest of today and you should NOT DRIVE or use heavy machinery until tomorrow (because of the sedation medicines used during the test).    FOLLOW UP: Our staff will call the number listed on your records the next business day following your procedure to check on you and address any questions or concerns that you may have regarding the information given to you following your procedure. If we do not reach you, we will leave a message.  However, if you are feeling well and you are not experiencing any problems, there is no need to return our call.  We will assume that you have returned to your regular daily activities without incident.  If any biopsies were taken you will be contacted by phone or by letter within the next 1-3 weeks.  Please call us at [phone] if you have not heard about the biopsies in 3 weeks.    SIGNATURES/CONFIDENTIALITY: You and/or your care partner have signed paperwork which will be entered into your electronic medical record.  These signatures attest to the fact that that the information above on your After Visit Summary has been reviewed and is understood.  Full responsibility of the confidentiality of this discharge information lies with you and/or your care-partner.

## 2017-06-19 NOTE — Progress Notes (Signed)
Report given to PACU, vss 

## 2017-06-22 ENCOUNTER — Telehealth: Payer: Self-pay

## 2017-06-22 NOTE — Telephone Encounter (Signed)
  Follow up Call-  Call back number 06/19/2017  Post procedure Call Back phone  # [phone]  Permission to leave phone message Yes  Some recent data might be hidden     Left message on machine

## 2017-06-22 NOTE — Telephone Encounter (Signed)
Left message

## 2017-06-25 ENCOUNTER — Encounter: Payer: Self-pay | Admitting: Gastroenterology

## 2017-08-19 ENCOUNTER — Other Ambulatory Visit: Payer: Self-pay | Admitting: Family Medicine

## 2017-08-19 DIAGNOSIS — I10 Essential (primary) hypertension: Secondary | ICD-10-CM

## 2017-10-23 ENCOUNTER — Encounter: Payer: Self-pay | Admitting: Family Medicine

## 2017-10-23 ENCOUNTER — Ambulatory Visit (INDEPENDENT_AMBULATORY_CARE_PROVIDER_SITE_OTHER): Payer: 59 | Admitting: Family Medicine

## 2017-10-23 VITALS — BP 142/88 | HR 80 | Temp 99.2°F | Resp 16 | Ht 62.0 in | Wt 147.0 lb

## 2017-10-23 DIAGNOSIS — I10 Essential (primary) hypertension: Secondary | ICD-10-CM | POA: Diagnosis not present

## 2017-10-23 DIAGNOSIS — R7303 Prediabetes: Secondary | ICD-10-CM | POA: Diagnosis not present

## 2017-10-23 LAB — POCT GLYCOSYLATED HEMOGLOBIN (HGB A1C): HbA1c, POC (prediabetic range): 5.7 % (ref 5.7–6.4)

## 2017-10-23 NOTE — Progress Notes (Signed)
Subjective:  Michaela Wilson is a 52 y.o. female with hypertension. Patient states that she has not been taking the HCTZ for the past month. Patient states that she felt dizzy while taking.  Hypertension ROS: not taking medications regularly as instructed, medication side effects include: no side effects noted and dizziness, no TIA's, no chest pain on exertion, no dyspnea on exertion, no swelling of ankles, no palpitations and no intermittent claudication symptoms.  Patient is exercising daily and eating a low sodium diet.    Objective:  BP (!) 142/88 (BP Location: Left Arm, Patient Position: Sitting, Cuff Size: Normal)   Pulse 80   Temp 99.2 F (37.3 C) (Oral)   Resp 16   Ht 5\' 2"  (1.575 m)   Wt 147 lb (66.7 kg)   SpO2 100%   BMI 26.89 kg/m   Appearance alert, well appearing, and in no distress, oriented to person, place, and time and well hydrated. General exam BP noted to be well controlled today in office, S1, S2 normal, no gallop, no murmur, chest clear, no JVD, no HSM, no edema.  Lab review: labs reviewed, I note that glycosylated hemoglobin normal, lipids ordered today, comprehensive metabolic panel abnormal ordered today, labs ordered today.   Assessment:   Hypertension well controlled.   Plan:  Lab results and schedule of future lab studies reviewed with patient. Orders and follow up as documented in patient record. Reviewed diet, exercise and weight control. Cardiovascular risk and specific lipid/LDL goals reviewed. Reviewed medications and side effects in detail. The following changes are to be made: patient can d/c HCTZ. monitor BP at home Follow up: 6 months and as needed..   Return to care as scheduled and prn. Patient verbalized understanding and agreed with plan of care.   Ms. Doug Sou. Nathaneil Canary, FNP-BC [facility] [address] [place], [place] [address] [phone]

## 2017-10-23 NOTE — Patient Instructions (Signed)
Good job with controlling your blood pressure. Continue with exercise and diet.  Managing Your Hypertension Hypertension is commonly called high blood pressure. This is when the force of your blood pressing against the walls of your arteries is too strong. Arteries are blood vessels that carry blood from your heart throughout your body. Hypertension forces the heart to work harder to pump blood, and may cause the arteries to become narrow or stiff. Having untreated or uncontrolled hypertension can cause heart attack, stroke, kidney disease, and other problems. What are blood pressure readings? A blood pressure reading consists of a higher number over a lower number. Ideally, your blood pressure should be below 120/80. The first ("top") number is called the systolic pressure. It is a measure of the pressure in your arteries as your heart beats. The second ("bottom") number is called the diastolic pressure. It is a measure of the pressure in your arteries as the heart relaxes. What does my blood pressure reading mean? Blood pressure is classified into four stages. Based on your blood pressure reading, your health care provider may use the following stages to determine what type of treatment you need, if any. Systolic pressure and diastolic pressure are measured in a unit called mm Hg. Normal  Systolic pressure: below 902.  Diastolic pressure: below 80. Elevated  Systolic pressure: 409-735.  Diastolic pressure: below 80. Hypertension stage 1  Systolic pressure: 329-924.  Diastolic pressure: 26-83. Hypertension stage 2  Systolic pressure: 419 or above.  Diastolic pressure: 90 or above. What health risks are associated with hypertension? Managing your hypertension is an important responsibility. Uncontrolled hypertension can lead to:  A heart attack.  A stroke.  A weakened blood vessel (aneurysm).  Heart failure.  Kidney damage.  Eye damage.  Metabolic syndrome.  Memory and  concentration problems.  What changes can I make to manage my hypertension? Hypertension can be managed by making lifestyle changes and possibly by taking medicines. Your health care provider will help you make a plan to bring your blood pressure within a normal range. Eating and drinking  Eat a diet that is high in fiber and potassium, and low in salt (sodium), added sugar, and fat. An example eating plan is called the DASH (Dietary Approaches to Stop Hypertension) diet. To eat this way: ? Eat plenty of fresh fruits and vegetables. Try to fill half of your plate at each meal with fruits and vegetables. ? Eat whole grains, such as whole wheat pasta, brown rice, or whole grain bread. Fill about one quarter of your plate with whole grains. ? Eat low-fat diary products. ? Avoid fatty cuts of meat, processed or cured meats, and poultry with skin. Fill about one quarter of your plate with lean proteins such as fish, chicken without skin, beans, eggs, and tofu. ? Avoid premade and processed foods. These tend to be higher in sodium, added sugar, and fat.  Reduce your daily sodium intake. Most people with hypertension should eat less than 1,500 mg of sodium a day.  Limit alcohol intake to no more than 1 drink a day for nonpregnant women and 2 drinks a day for men. One drink equals 12 oz of beer, 5 oz of wine, or 1 oz of hard liquor. Lifestyle  Work with your health care provider to maintain a healthy body weight, or to lose weight. Ask what an ideal weight is for you.  Get at least 30 minutes of exercise that causes your heart to beat faster (aerobic exercise) most days  of the week. Activities may include walking, swimming, or biking.  Include exercise to strengthen your muscles (resistance exercise), such as weight lifting, as part of your weekly exercise routine. Try to do these types of exercises for 30 minutes at least 3 days a week.  Do not use any products that contain nicotine or tobacco,  such as cigarettes and e-cigarettes. If you need help quitting, ask your health care provider.  Control any long-term (chronic) conditions you have, such as high cholesterol or diabetes. Monitoring  Monitor your blood pressure at home as told by your health care provider. Your personal target blood pressure may vary depending on your medical conditions, your age, and other factors.  Have your blood pressure checked regularly, as often as told by your health care provider. Working with your health care provider  Review all the medicines you take with your health care provider because there may be side effects or interactions.  Talk with your health care provider about your diet, exercise habits, and other lifestyle factors that may be contributing to hypertension.  Visit your health care provider regularly. Your health care provider can help you create and adjust your plan for managing hypertension. Will I need medicine to control my blood pressure? Your health care provider may prescribe medicine if lifestyle changes are not enough to get your blood pressure under control, and if:  Your systolic blood pressure is 130 or higher.  Your diastolic blood pressure is 80 or higher.  Take medicines only as told by your health care provider. Follow the directions carefully. Blood pressure medicines must be taken as prescribed. The medicine does not work as well when you skip doses. Skipping doses also puts you at risk for problems. Contact a health care provider if:  You think you are having a reaction to medicines you have taken.  You have repeated (recurrent) headaches.  You feel dizzy.  You have swelling in your ankles.  You have trouble with your vision. Get help right away if:  You develop a severe headache or confusion.  You have unusual weakness or numbness, or you feel faint.  You have severe pain in your chest or abdomen.  You vomit repeatedly.  You have trouble  breathing. Summary  Hypertension is when the force of blood pumping through your arteries is too strong. If this condition is not controlled, it may put you at risk for serious complications.  Your personal target blood pressure may vary depending on your medical conditions, your age, and other factors. For most people, a normal blood pressure is less than 120/80.  Hypertension is managed by lifestyle changes, medicines, or both. Lifestyle changes include weight loss, eating a healthy, low-sodium diet, exercising more, and limiting alcohol. This information is not intended to replace advice given to you by your health care provider. Make sure you discuss any questions you have with your health care provider. Document Released: 11/26/2011 Document Revised: 01/30/2016 Document Reviewed: 01/30/2016 Elsevier Interactive Patient Education  [employer].

## 2017-10-24 LAB — COMPREHENSIVE METABOLIC PANEL
ALT: 14 IU/L (ref 0–32)
AST: 12 IU/L (ref 0–40)
Albumin/Globulin Ratio: 1.3 (ref 1.2–2.2)
Albumin: 4.2 g/dL (ref 3.5–5.5)
Alkaline Phosphatase: 87 IU/L (ref 39–117)
BUN/Creatinine Ratio: 12 (ref 9–23)
BUN: 11 mg/dL (ref 6–24)
Bilirubin Total: 0.3 mg/dL (ref 0.0–1.2)
CO2: 22 mmol/L (ref 20–29)
Calcium: 9.7 mg/dL (ref 8.7–10.2)
Chloride: 105 mmol/L (ref 96–106)
Creatinine, Ser: 0.93 mg/dL (ref 0.57–1.00)
GFR calc Af Amer: 83 mL/min/{1.73_m2} (ref >59)
GFR calc non Af Amer: 72 mL/min/{1.73_m2} (ref >59)
Globulin, Total: 3.2 g/dL (ref 1.5–4.5)
Glucose: 88 mg/dL (ref 65–99)
Potassium: 4.3 mmol/L (ref 3.5–5.2)
Sodium: 140 mmol/L (ref 134–144)
Total Protein: 7.4 g/dL (ref 6.0–8.5)

## 2017-10-24 LAB — LIPID PANEL WITH LDL/HDL RATIO
Cholesterol, Total: 202 mg/dL — ABNORMAL HIGH (ref 100–199)
HDL: 58 mg/dL (ref >39)
LDL Calculated: 133 mg/dL — ABNORMAL HIGH (ref 0–99)
LDl/HDL Ratio: 2.3 ratio (ref 0.0–3.2)
Triglycerides: 57 mg/dL (ref 0–149)
VLDL Cholesterol Cal: 11 mg/dL (ref 5–40)

## 2018-04-26 ENCOUNTER — Ambulatory Visit (INDEPENDENT_AMBULATORY_CARE_PROVIDER_SITE_OTHER): Payer: 59 | Admitting: Family Medicine

## 2018-04-26 ENCOUNTER — Ambulatory Visit (HOSPITAL_COMMUNITY)
Admission: RE | Admit: 2018-04-26 | Discharge: 2018-04-26 | Disposition: A | Discharge status: Home / Self Care | Payer: 59 | Source: Ambulatory Visit | Attending: Family Medicine | Admitting: Family Medicine

## 2018-04-26 ENCOUNTER — Encounter: Payer: Self-pay | Admitting: Family Medicine

## 2018-04-26 VITALS — BP 140/83 | HR 83 | Temp 98.4°F | Resp 16 | Ht 62.0 in | Wt 150.0 lb

## 2018-04-26 DIAGNOSIS — Z1239 Encounter for other screening for malignant neoplasm of breast: Secondary | ICD-10-CM | POA: Diagnosis not present

## 2018-04-26 DIAGNOSIS — G478 Other sleep disorders: Secondary | ICD-10-CM

## 2018-04-26 DIAGNOSIS — I10 Essential (primary) hypertension: Secondary | ICD-10-CM

## 2018-04-26 DIAGNOSIS — R0681 Apnea, not elsewhere classified: Secondary | ICD-10-CM

## 2018-04-26 DIAGNOSIS — M2550 Pain in unspecified joint: Secondary | ICD-10-CM | POA: Diagnosis not present

## 2018-04-26 DIAGNOSIS — R7303 Prediabetes: Secondary | ICD-10-CM | POA: Diagnosis not present

## 2018-04-26 LAB — POCT URINALYSIS DIPSTICK
Bilirubin, UA: NEGATIVE
Blood, UA: NEGATIVE
Glucose, UA: NEGATIVE
Ketones, UA: NEGATIVE
Leukocytes, UA: NEGATIVE
Nitrite, UA: NEGATIVE
Protein, UA: NEGATIVE
Spec Grav, UA: >=1.03 — AB (ref 1.010–1.025)
Urobilinogen, UA: 0.2 E.U./dL
pH, UA: 5.5 (ref 5.0–8.0)

## 2018-04-26 LAB — POCT GLYCOSYLATED HEMOGLOBIN (HGB A1C): Hemoglobin A1C: 5.4 % (ref 4.0–5.6)

## 2018-04-26 NOTE — Progress Notes (Signed)
Patient [facility]   Progress Note: General Provider: Lanae Boast, FNP  SUBJECTIVE:   Michaela Wilson is a 53 y.o. female who  has a past medical history of Anemia, Cellulitis of right buttock (03/07/2015), Hypertension, Seasonal allergies, and Vertigo.. Patient presents today for Hypertension; Follow-up (pre diabetes ); Knee Pain (bilateral ); and Hip Pain (bilateral ) Patient presents today for follow-up on hypertension.  She currently has not taking any medication for this.  She is managing with diet and exercise. Patient states that she has started going for psychiatry for depression and anxiety.  Patient sees Dr. Natasha Mead for psychiatry and has been prescribed Wellbutrin and trazodone with relief. 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 Patient denies a family history of autoimmune disorders. Patient states that she has not mentioned pain before due to her anxiety and depression. Has been taking ibuprofen intermittently for pain. She states that she was fearful of getting addicted to this medication.    Review of Systems  Constitutional: Negative.   HENT: Negative.   Eyes: Negative.   Respiratory: Negative.   Cardiovascular: Negative.   Gastrointestinal: Negative.   Genitourinary: Negative.   Musculoskeletal: Positive for back pain and joint pain.  Skin: Negative.   Neurological: Negative.   Psychiatric/Behavioral: Negative.      OBJECTIVE: BP 140/83 (BP Location: Left Arm, Patient Position: Sitting, Cuff Size: Normal)   Pulse 83   Temp 98.4 F (36.9 C) (Oral)   Resp 16   Ht 5\' 2"  (1.575 m)   Wt 150 lb (68 kg)   SpO2 100%   BMI 27.44 kg/m   Wt Readings from Last 3 Encounters:  04/26/18 150 lb (68 kg)  10/23/17 147 lb (66.7 kg)  06/19/17 155 lb (70.3 kg)     Physical Exam Vitals signs and nursing note reviewed.    Constitutional:      General: She is not in acute distress.    Appearance: She is well-developed.  HENT:     Head: Normocephalic and atraumatic.  Eyes:     Conjunctiva/sclera: Conjunctivae normal.     Pupils: Pupils are equal, round, and reactive to light.  Neck:     Musculoskeletal: Normal range of motion.  Cardiovascular:     Rate and Rhythm: Normal rate and regular rhythm.     Heart sounds: Normal heart sounds.  Pulmonary:     Effort: Pulmonary effort is normal. No respiratory distress.     Breath sounds: Normal breath sounds.  Abdominal:     General: Bowel sounds are normal. There is no distension.     Palpations: Abdomen is soft.  Musculoskeletal:     Right hip: She exhibits decreased range of motion and bony tenderness.     Left hip: She exhibits decreased range of motion and bony tenderness.     Right knee: Tenderness found. Medial joint line and lateral joint line tenderness noted.     Left knee: Tenderness found. Medial joint line and lateral joint line tenderness noted.     Lumbar back: She exhibits tenderness.  Skin:    General: Skin is warm and dry.  Neurological:     Mental Status: She is alert and oriented to person, place, and time.  Psychiatric:        Behavior: Behavior normal.        Thought Content: Thought content normal.  ASSESSMENT/PLAN:   1. Essential hypertension The patient is asked to make an attempt to improve diet and exercise patterns to aid in medical management of this problem.  - HgB A1c - Urinalysis Dipstick - Comprehensive metabolic panel  2. Breast cancer screening Health maintenance - MM Digital Screening; Future  3. Pain in joint involving multiple sites Pending labs. Will adjust medications accordingly.    - Rheumatoid Factor Screen - Lyme Disease, Western Blot - DG Knee Bilateral Standing AP; Future - DG HIPS BILAT WITH PELVIS 3-4 VIEWS; Future  4. Prediabetes A1c 5.4 today  5. Apneic episodes Patient requesting  sleep study for apneic episodes and sleep paralysis.  - Split night study; Future  6. Sleep paralysis - Split night study; Future    Return in about 6 months (around 10/25/2018) for htn.    The patient was given clear instructions to go to ER or return to medical center if symptoms do not improve, worsen or new problems develop. The patient verbalized understanding and agreed with plan of care.   Ms. Doug Sou. Nathaneil Canary, FNP-BC [facility] [address] [place], [place] [address] [phone]

## 2018-04-26 NOTE — Patient Instructions (Addendum)
Hypertension Hypertension is another name for high blood pressure. High blood pressure forces your heart to work harder to pump blood. This can cause problems over time. There are two numbers in a blood pressure reading. There is a top number (systolic) over a bottom number (diastolic). It is best to have a blood pressure below 120/80. Healthy choices can help lower your blood pressure. You may need medicine to help lower your blood pressure if:  Your blood pressure cannot be lowered with healthy choices.  Your blood pressure is higher than 130/80. Follow these instructions at home: Eating and drinking   If directed, follow the DASH eating plan. This diet includes: ? Filling half of your plate at each meal with fruits and vegetables. ? Filling one quarter of your plate at each meal with whole grains. Whole grains include whole wheat pasta, brown rice, and whole grain bread. ? Eating or drinking low-fat dairy products, such as skim milk or low-fat yogurt. ? Filling one quarter of your plate at each meal with low-fat (lean) proteins. Low-fat proteins include fish, skinless chicken, eggs, beans, and tofu. ? Avoiding fatty meat, cured and processed meat, or chicken with skin. ? Avoiding premade or processed food.  Eat less than 1,500 mg of salt (sodium) a day.  Limit alcohol use to no more than 1 drink a day for nonpregnant women and 2 drinks a day for men. One drink equals 12 oz of beer, 5 oz of wine, or 1 oz of hard liquor. Lifestyle  Work with your doctor to stay at a healthy weight or to lose weight. Ask your doctor what the best weight is for you.  Get at least 30 minutes of exercise that causes your heart to beat faster (aerobic exercise) most days of the week. This may include walking, swimming, or biking.  Get at least 30 minutes of exercise that strengthens your muscles (resistance exercise) at least 3 days a week. This may include lifting weights or pilates.  Do not use any  products that contain nicotine or tobacco. This includes cigarettes and e-cigarettes. If you need help quitting, ask your doctor.  Check your blood pressure at home as told by your doctor.  Keep all follow-up visits as told by your doctor. This is important. Medicines  Take over-the-counter and prescription medicines only as told by your doctor. Follow directions carefully.  Do not skip doses of blood pressure medicine. The medicine does not work as well if you skip doses. Skipping doses also puts you at risk for problems.  Ask your doctor about side effects or reactions to medicines that you should watch for. Contact a doctor if:  You think you are having a reaction to the medicine you are taking.  You have headaches that keep coming back (recurring).  You feel dizzy.  You have swelling in your ankles.  You have trouble with your vision. Get help right away if:  You get a very bad headache.  You start to feel confused.  You feel weak or numb.  You feel faint.  You get very bad pain in your: ? Chest. ? Belly (abdomen).  You throw up (vomit) more than once.  You have trouble breathing. Summary  Hypertension is another name for high blood pressure.  Making healthy choices can help lower blood pressure. If your blood pressure cannot be controlled with healthy choices, you may need to take medicine. This information is not intended to replace advice given to you by your health care   provider. Make sure you discuss any questions you have with your health care provider. Document Released: 08/20/2007 Document Revised: 01/30/2016 Document Reviewed: 01/30/2016 Elsevier Interactive Patient Education  2019 Elsevier Inc.  Joint Pain  Joint pain can be caused by many things. It is likely to go away if you follow instructions from your doctor for taking care of yourself at home. Sometimes, you may need more treatment. Follow these instructions at home: Managing pain, stiffness,  and swelling  If told, put ice on the painful area. Put ice in a plastic bag. Place a towel between your skin and the bag. Leave the ice on for 20 minutes, 2-3 times a day. If told, put heat on the painful area. Do this as often as told by your doctor. Use the heat source that your doctor recommends, such as a moist heat pack or a heating pad. Place a towel between your skin and the heat source. Leave the heat on for 20-30 minutes. Take off the heat if your skin gets bright red. This is especially important if you are unable to feel pain, heat, or cold. You may have a greater risk of getting burned. Move your fingers or toes below the painful joint often. This helps with stiffness and swelling. If possible, raise (elevate) the painful joint above the level of your heart while you are sitting or lying down. To do this, try putting a few pillows under the painful joint. Activity Rest the painful joint for as long as told. Do not do things that cause pain or make your pain worse. Begin exercising or stretching the affected area, as told by your doctor. Ask your doctor what types of exercise are safe for you. If you have an elastic bandage, sling, or splint: Wear the device as told by your doctor. Take it off only as told by your doctor. Loosen the device if your fingers or toes below the joint: Tingle. Lose feeling (get numb). Get cold and blue. Keep the device clean. Ask your doctor if you should take off the device before bathing. You may need to cover it with a watertight covering when you take a bath or a shower. General instructions Take over-the-counter and prescription medicines only as told by your doctor. Do not use any products that contain nicotine or tobacco. These include cigarettes and e-cigarettes. If you need help quitting, ask your doctor. Keep all follow-up visits as told by your doctor. This is important. Contact a doctor if: You have pain that gets worse and does not get  better with medicine. Your joint pain does not get better in 3 days. You have more bruising or swelling. You have a fever. You lose 10 lb (4.5 kg) or more without trying. Get help right away if: You cannot move the joint. Your fingers or toes tingle, lose feeling, or get cold and blue. You have a fever along with a joint that is red, warm, and swollen. Summary Joint pain can be caused by many things. It often goes away if you follow instructions from your doctor for taking care of yourself at home. Rest the painful joint for as long as told. Do not do things that cause pain or make your pain worse. Take over-the-counter and prescription medicines only as told by your doctor. This information is not intended to replace advice given to you by your health care provider. Make sure you discuss any questions you have with your health care provider. Document Released: 02/19/2009 Document Revised: 12/17/2016 Document  Reviewed: 12/17/2016 Elsevier Interactive Patient Education  2019 Elsevier Inc.  

## 2018-04-27 NOTE — Progress Notes (Signed)
Please inform patient that her lab work was negative for autoimmune disease. Everything appears to be normal. Lymes testing is pending. She will be contacted with those results.   Please see the other message on her xrays.

## 2018-04-28 ENCOUNTER — Telehealth: Payer: Self-pay

## 2018-04-28 LAB — URIC A+ESR-WES+ANA+RA QN+CR
ASO: 34 IU/mL (ref 0.0–200.0)
Anti Nuclear Antibody(ANA): NEGATIVE
CRP: 3 mg/L (ref 0–10)
Rhuematoid fact SerPl-aCnc: <10 IU/mL (ref 0.0–13.9)
Sed Rate: 24 mm/hr (ref 0–40)
Uric Acid: 5.4 mg/dL (ref 2.5–7.1)

## 2018-04-28 LAB — LYME DISEASE, WESTERN BLOT
IgG P18 Ab.: ABSENT
IgG P23 Ab.: ABSENT
IgG P28 Ab.: ABSENT
IgG P30 Ab.: ABSENT
IgG P39 Ab.: ABSENT
IgG P41 Ab.: ABSENT
IgG P45 Ab.: ABSENT
IgG P58 Ab.: ABSENT
IgG P66 Ab.: ABSENT
IgG P93 Ab.: ABSENT
IgM P23 Ab.: ABSENT
IgM P39 Ab.: ABSENT
IgM P41 Ab.: ABSENT
Lyme IgG Wb: NEGATIVE
Lyme IgM Wb: NEGATIVE

## 2018-04-28 LAB — COMPREHENSIVE METABOLIC PANEL
ALT: 18 IU/L (ref 0–32)
AST: 18 IU/L (ref 0–40)
Albumin/Globulin Ratio: 1.3 (ref 1.2–2.2)
Albumin: 4 g/dL (ref 3.8–4.9)
Alkaline Phosphatase: 81 IU/L (ref 39–117)
BUN/Creatinine Ratio: 9 (ref 9–23)
BUN: 8 mg/dL (ref 6–24)
Bilirubin Total: 0.3 mg/dL (ref 0.0–1.2)
CO2: 24 mmol/L (ref 20–29)
Calcium: 9.4 mg/dL (ref 8.7–10.2)
Chloride: 105 mmol/L (ref 96–106)
Creatinine, Ser: 0.88 mg/dL (ref 0.57–1.00)
GFR calc Af Amer: 88 mL/min/{1.73_m2} (ref >59)
GFR calc non Af Amer: 76 mL/min/{1.73_m2} (ref >59)
Globulin, Total: 3.2 g/dL (ref 1.5–4.5)
Glucose: 91 mg/dL (ref 65–99)
Potassium: 3.9 mmol/L (ref 3.5–5.2)
Sodium: 144 mmol/L (ref 134–144)
Total Protein: 7.2 g/dL (ref 6.0–8.5)

## 2018-04-28 NOTE — Telephone Encounter (Signed)
Called, no answer. Left a voicemail for patient to call back. Thanks!    Please inform patient that her lab work was negative for autoimmune disease. Everything appears to be normal. Lymes testing is pending. She will be contacted with those results.   Please see the other message on her xrays.

## 2018-04-28 NOTE — Telephone Encounter (Signed)
-----   Message from Lanae Boast, [place] sent at 04/27/2018  8:28 PM EST ----- Please inform the patient of the following results.   There is joint space narrowing in the knees and degeneration of the bone in the pelvis.    When joint space narrowing occurs, the cartilage no longer keeps the bones a normal distance apart. This can be painful as the bones rub or put too much pressure on each other. Joint space narrowing can also be a result of conditions such as osteoarthritis (OA) or rheumatoid arthritis (RA).  Degeneration is a type of arthritis that occurs when flexible tissue at the ends of bones wears down. The wearing down of the protective tissue at the ends of bones (cartilage) occurs gradually and worsens over time.  The labs are still pending.

## 2018-04-28 NOTE — Telephone Encounter (Signed)
Patient returned call and I advised that las were negative for autoimmune disease and everything appears to be normal in labs. Advised that we will call when lymes testing is completed due to this still pending.   Advised that this is joint space narrowing in knees and degeneration of the bones in the pelvis. Explained to her what happens when space narrowing occurs and that the cartilage is no longer keeping bones a normal distance apart. Advised that this can call the pain and be a result of conditions such as osteoarthritis or rheumatoid arthritis.   Patient asked what the next step is since the pain has increased. She is currently taking otc tylenol/motrin/aleve every day and this is not helping. Please advise. Thanks!

## 2018-05-04 ENCOUNTER — Other Ambulatory Visit: Payer: Self-pay | Admitting: Family Medicine

## 2018-05-04 DIAGNOSIS — M2550 Pain in unspecified joint: Secondary | ICD-10-CM

## 2018-05-04 NOTE — Progress Notes (Signed)
Please let patient know that I referred her to PT for further evaluation and treatment of her pain.

## 2018-05-05 NOTE — Telephone Encounter (Signed)
Called, no answer. Left a message that we will refer to PT for further evaluation and treatment for pain. Asked that if any questions to call us back. Thanks!

## 2018-05-28 ENCOUNTER — Ambulatory Visit: Payer: 59 | Attending: Family Medicine | Admitting: Physical Therapy

## 2018-05-28 ENCOUNTER — Other Ambulatory Visit: Payer: Self-pay

## 2018-05-28 DIAGNOSIS — R262 Difficulty in walking, not elsewhere classified: Secondary | ICD-10-CM | POA: Diagnosis present

## 2018-05-28 DIAGNOSIS — M25552 Pain in left hip: Secondary | ICD-10-CM | POA: Diagnosis not present

## 2018-05-28 DIAGNOSIS — G8929 Other chronic pain: Secondary | ICD-10-CM | POA: Diagnosis present

## 2018-05-28 DIAGNOSIS — M25561 Pain in right knee: Secondary | ICD-10-CM | POA: Insufficient documentation

## 2018-05-28 DIAGNOSIS — M25562 Pain in left knee: Secondary | ICD-10-CM | POA: Insufficient documentation

## 2018-05-28 DIAGNOSIS — M6281 Muscle weakness (generalized): Secondary | ICD-10-CM | POA: Diagnosis present

## 2018-05-28 NOTE — Therapy (Signed)
[place], [name], [address] Phone: [phone]   Fax:  [phone]  Physical Therapy Evaluation  Patient Details  Name: Michaela Wilson MRN: [id number] Date of Birth: 13-Nov-1965 Referring Provider (PT): Lanae Boast, FNP   Encounter Date: 05/28/2018  PT End of Session - 05/28/18 0943    Visit Number  1    Number of Visits  12    PT Start Time  1749    PT Stop Time  0935    PT Time Calculation (min)  40 min    Activity Tolerance  Patient tolerated treatment well    Behavior During Therapy  [facility] for tasks assessed/performed       Past Medical History:  Diagnosis Date  . Anemia   . Cellulitis of right buttock 03/07/2015  . Hypertension   . Seasonal allergies   . Vertigo     Past Surgical History:  Procedure Laterality Date  . ABCESS DRAINAGE    . CHOLECYSTECTOMY    . TUBAL LIGATION      There were no vitals filed for this visit.   Subjective Assessment - 05/28/18 0859    Subjective  Pt arriving to therapy reporting pain for years in left knee and into left hip. Pt also reporting R knee pain. Pt reporting the MD has ruled out RA. Pt reporting knee buckeling and popping bilaterally. Pt reporting no relief in pain. pt reporting difficulty with getting dressed and donning her shoes. 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 Pt reporting while sitting she has to shift her weight from side to side due to pain. Pt reporting getting out of bed is difficult and she has to sit a while before standing.  Pt reporting episodes of almost falling and fatigue more toward the end of the day.     Pertinent History  Pt with chronic pain in multiple sites: L hip, L knee, R knee, and Low Back, h/o vertigo    Limitations  Walking;Standing;Lifting;House hold activities    How long can you sit comfortably?  5-10 minutes before shifting my weight     How long can you stand comfortably?  5 minutes    How long can you walk comfortably?  5 minutes    Diagnostic tests  MRI performed    Patient Stated Goals  work without pain, walk longer distances, be able to shop without pain    Currently in Pain?  Yes    Pain Score  8     Pain Location  Hip    Pain Orientation  Left    Pain Descriptors / Indicators  Aching    Pain Type  Chronic pain    Pain Onset  More than a month ago    Pain Frequency  Constant    Aggravating Factors   sitting long periods, working, bending    Pain Relieving Factors  walking short distances    Effect of Pain on Daily Activities  difficutly with ADL's, working and household activities    Multiple Pain Sites  --    Pain Score  8    Pain Location  Knee    Pain Orientation  Left;Right    Pain Descriptors / Indicators  Aching;Sore    Pain Type  Chronic pain    Pain Onset  More than a month ago    Pain Frequency  Constant  Aggravating Factors   bending, when I first stand,     Pain Relieving Factors  changing positions    Pain Score  7    Pain Location  Back    Pain Orientation  Lower;Mid    Pain Descriptors / Indicators  Discomfort    Pain Type  Chronic pain    Pain Onset  More than a month ago    Pain Frequency  Intermittent    Aggravating Factors   bending, sitting prolonged    Pain Relieving Factors  changing positions         [facility] PT Assessment - 05/28/18 0001      Assessment   Medical Diagnosis  pain in L Hip, L knee and R knee    Referring Provider (PT)  Lanae Boast, FNP    Onset Date/Surgical Date  --   years ago that progressive got worse   Hand Dominance  Left    Prior Therapy  no      Precautions   Precautions  None      Restrictions   Weight Bearing Restrictions  No      Balance Screen   Has the patient fallen in the past 6 months  No    Is the patient reluctant to leave their home because of a fear of falling?   No      Home Environment   Living Environment  Private residence     Living Arrangements  Alone    Type of [place] to enter    Entrance Stairs-Number of Steps  15    Entrance Stairs-Rails  Left      Prior Function   Level of Independence  Independent    Leisure  eating with friends, going to movies      Cognition   Overall Cognitive Status  Within Functional Limits for tasks assessed      Posture/Postural Control   Posture Comments  increased lumbar lordosis and anterior pelvic tilt      ROM / Strength   AROM / PROM / Strength  Strength      Strength   Overall Strength Comments  pt reporting increased pain with all MMT on the left side    Strength Assessment Site  Hip;Knee;Ankle    Right/Left Hip  Right;Left    Right Hip Flexion  3+/5    Right Hip Extension  4/5    Right Hip ABduction  4-/5    Right Hip ADduction  4-/5    Left Hip Flexion  3-/5    Left Hip Extension  3/5    Left Hip ABduction  3-/5    Left Hip ADduction  3-/5    Right/Left Knee  Right;Left    Right Knee Flexion  4+/5    Right Knee Extension  4+/5    Left Knee Flexion  4-/5    Left Knee Extension  4-/5    Right/Left Ankle  Right;Left    Right Ankle Dorsiflexion  4/5    Left Ankle Dorsiflexion  4-/5      Flexibility   Soft Tissue Assessment /Muscle Length  yes    Hamstrings  R at 90 degrees, L at 75 degrees    Quadratus Lumborum  weakness noted and painful when tested      Palpation   Palpation comment  pt with tenderness on lumbar paraspinals and point tenderness over SI joint bilaterally      Ambulation/Gait  Gait Comments  Pt amb with antaligic gait with decreased weight shifting to L LE. Pt also requiring step to gait pattern when ascending and descending stairs      Balance   Balance Assessed  --   SLS R: 20 seconds, L: 3 seconds     Functional Gait  Assessment   Gait assessed   --                Objective measurements completed on examination: See above findings.      [facility] PT Treatment/Exercise -  05/28/18 0001      Exercises   Exercises  Lumbar;Knee/Hip      Lumbar Exercises: Supine   Pelvic Tilt  5 reps;5 seconds    Clam  10 reps;3 seconds    Clam Limitations  red theraband, painful in L  hip      Knee/Hip Exercises: Supine   Short Arc Quad Sets  Strengthening;Both;10 reps             PT Education - 05/28/18 [phone]    Education Details  HEP, purpose of PT and POC    Person(s) Educated  Patient    Methods  Explanation;Demonstration;Handout;Verbal cues    Comprehension  Returned demonstration;Verbalized understanding       PT Short Term Goals - 05/28/18 0951      PT SHORT TERM GOAL #1   Title  pt will be independent in her HEP.     Time  3    Period  Weeks    Status  New    Target Date  06/18/18        PT Long Term Goals - 05/28/18 3662      PT LONG TERM GOAL #1   Title  Pt will improve her SLS on the L LE to >/= 20 seconds.     Baseline  3 seconds on 05/28/2018    Time  6    Period  Weeks    Status  New    Target Date  07/09/18      PT LONG TERM GOAL #2   Title  pt will improve her L hip strength to >/= 4/5 in order to improve functional mobilty.     Baseline  3/5 grossly on 05/28/2018    Time  6    Period  Weeks    Status  New    Target Date  07/09/18      PT LONG TERM GOAL #3   Title  Pt will be able to amb a flight of stairs step over step with pain </= 3/10 using single hand rail.     Baseline  step to gait pattern and pain 7-8/10 on 05/28/2018    Time  6    Period  Weeks    Status  New    Target Date  07/09/18      PT LONG TERM GOAL #4   Title  pt will be able to walk for 20 minutes with pain </= 3/10.     Baseline  pain increases to 7-8/10 with walking or standing more than 5 minutes    Time  6    Period  Weeks    Status  New    Target Date  07/09/18             Plan - 05/28/18 0944    Clinical Impression Statement  Pt presenting today for PT evaluation of muliple pain sites including L hip, L knee, LBP  and R knee. Pt  reporting pain has been going on for years in the L LE and has progressed to her her R knee and back. Pt having difficulty with ADL's and incresaed pain with working. 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 Pt presenting with incresaed lordosis in her lumbar spine. Pt with weakness noted in bilateral hip L>R. Pt with crepitus noted in bilateral knees. Pt with tenderness noted of SI joint with painful compression. Pt with limited hamstring flexibilty on the L compared to right. Pt amb with antalgic gait and reporting having to use step to gait pattern when ascending and descending the stairs at her apartment. Pt also with decresaed SLS on the L at 3 seconds compared to 20 seconds on the R LE. Pt would benefit from skilled PT to address the listed impairments with the below interventions.     Personal Factors and Comorbidities  Other    Examination-Activity Limitations  Squat;Stairs;Stand;Dressing;Sit    Examination-Participation Restrictions  Laundry;Other    Clinical Decision Making  Moderate    Rehab Potential  Good    PT Frequency  2x / week    PT Duration  6 weeks    PT Treatment/Interventions  ADLs/Self Care Home Management;Electrical Stimulation;Moist Heat;Cryotherapy;Ultrasound;Gait training;Stair training;Functional mobility training;Balance training;Therapeutic exercise;Therapeutic activities;Patient/family education;Manual techniques;Passive range of motion;Dry needling;Taping    PT Next Visit Plan  LE strengthening. Lumbar stretching, Core stability,     PT Home Exercise Plan  PPT, SAQ, hip abd with red theraband in supine    Consulted and Agree with Plan of Care  Patient       Patient will benefit from skilled therapeutic intervention in order to improve the following deficits and impairments:  Pain, Abnormal gait, Postural dysfunction, Decreased mobility, Decreased activity tolerance, Decreased range of motion, Decreased strength,  Impaired flexibility, Decreased balance, Difficulty walking  Visit Diagnosis: Pain in left hip  Chronic pain of left knee  Chronic pain of right knee  Muscle weakness (generalized)  Difficulty in walking, not elsewhere classified     Problem List Patient Active Problem List   Diagnosis Date Noted  . Essential hypertension 06/25/2015  . Prediabetes 06/25/2015  . Sepsis ([place]) 03/07/2015  . Leukocytosis 03/07/2015    Oretha Caprice, PT 05/28/2018, 10:06 AM  [facility] [address] [place], [name], [address] Phone: [phone]   Fax:  [phone]  Name: Michaela Wilson MRN: [id number] Date of Birth: [birth year]

## 2018-06-10 ENCOUNTER — Ambulatory Visit: Payer: 59 | Admitting: Physical Therapy

## 2018-06-11 ENCOUNTER — Ambulatory Visit: Payer: 59 | Admitting: Physical Therapy

## 2018-06-16 ENCOUNTER — Inpatient Hospital Stay: Admission: RE | Admit: 2018-06-16 | Payer: 59 | Source: Ambulatory Visit

## 2018-06-16 ENCOUNTER — Telehealth: Payer: Self-pay | Admitting: Physical Therapy

## 2018-06-16 ENCOUNTER — Ambulatory Visit: Payer: 59 | Admitting: Physical Therapy

## 2018-06-16 NOTE — Telephone Encounter (Signed)
Michaela Wilson was contacted to inform her of recent policy to cancel all appointments until further notice due to concerns for COVID-19 transmission. She was informed that there are both video visit and home health options for continuing care. She was advised to call the main office at [address] with and questions and concerns. She was provided the office phone number for reference.

## 2018-06-21 ENCOUNTER — Ambulatory Visit: Payer: 59 | Admitting: Physical Therapy

## 2018-06-23 ENCOUNTER — Ambulatory Visit: Payer: 59 | Admitting: Physical Therapy

## 2018-06-28 ENCOUNTER — Encounter (HOSPITAL_BASED_OUTPATIENT_CLINIC_OR_DEPARTMENT_OTHER): Payer: 59

## 2018-06-29 ENCOUNTER — Ambulatory Visit: Payer: 59 | Admitting: Physical Therapy

## 2018-07-01 ENCOUNTER — Encounter: Payer: 59 | Admitting: Physical Therapy

## 2018-07-19 ENCOUNTER — Encounter: Payer: Self-pay | Admitting: Physical Therapy

## 2018-07-19 ENCOUNTER — Telehealth: Payer: Self-pay | Admitting: Physical Therapy

## 2018-07-19 NOTE — Telephone Encounter (Signed)
Patient was contacted today regarding temporary reduction of Outpatient Rehabilitation Services due to concerns for community transmission of COVID-19.  Assessed if patient needed to be seen in person by clinician (recent fall or acute injury that requires hands on assessment and advice, change in diet order, post-surgical, special cases, etc.).    Left a voicemail with options for continued care.   The patient was offered the continuation of their plan of care by using a telehealth visit.  Outpatient Rehabilitation Services will follow up with this client when we are able to safely resume care.     Patient is aware we can be reached by telephone during limited business hours in the meantime.   Raeford Razor, PT 07/19/18 9:44 AM Phone: [phone] Fax: [phone]

## 2018-07-26 ENCOUNTER — Encounter: Payer: Self-pay | Admitting: Physical Therapy

## 2018-07-26 ENCOUNTER — Ambulatory Visit: Payer: 59 | Attending: Family Medicine | Admitting: Physical Therapy

## 2018-07-26 ENCOUNTER — Other Ambulatory Visit: Payer: Self-pay

## 2018-07-26 DIAGNOSIS — M25552 Pain in left hip: Secondary | ICD-10-CM

## 2018-07-26 DIAGNOSIS — M25561 Pain in right knee: Secondary | ICD-10-CM | POA: Insufficient documentation

## 2018-07-26 DIAGNOSIS — R262 Difficulty in walking, not elsewhere classified: Secondary | ICD-10-CM

## 2018-07-26 DIAGNOSIS — G8929 Other chronic pain: Secondary | ICD-10-CM

## 2018-07-26 DIAGNOSIS — M25562 Pain in left knee: Secondary | ICD-10-CM | POA: Insufficient documentation

## 2018-07-26 DIAGNOSIS — M6281 Muscle weakness (generalized): Secondary | ICD-10-CM

## 2018-07-26 NOTE — Therapy (Signed)
[place], [name], [address] Phone: [phone]   Fax:  [phone]  Physical Therapy Treatment/Re-evaluation  Patient Details  Name: Michaela Wilson MRN: [id number] Date of Birth: 1965/06/29 Referring Provider (PT): Lanae Boast, FNP   Encounter Date: 07/26/2018  PT End of Session - 07/26/18 0849    Visit Number  2    Number of Visits  14    Authorization Type  UHC    PT Start Time  0803    PT Stop Time  0849    PT Time Calculation (min)  46 min    Activity Tolerance  Patient tolerated treatment well    Behavior During Therapy  [facility] for tasks assessed/performed       Past Medical History:  Diagnosis Date  . Anemia   . Cellulitis of right buttock 03/07/2015  . Hypertension   . Seasonal allergies   . Vertigo     Past Surgical History:  Procedure Laterality Date  . ABCESS DRAINAGE    . CHOLECYSTECTOMY    . TUBAL LIGATION      There were no vitals filed for this visit.  Subjective Assessment - 07/26/18 0804    Subjective  My left hip is really killing me- a stabbing pain, constant, throbbing. My Right hip is hurting because I am limping. What saved me was getting up and down during the day but since the virus I am doing so much sitting which increases my pain. N/T to knee. (touching Gr troch.) this is right where it is.     Pertinent History  Pt with chronic pain in multiple sites: L hip, L knee, R knee, and Low Back, h/o vertigo    How long can you sit comfortably?  5-10 minutes before shifting my weight     How long can you stand comfortably?  5 minutes    Patient Stated Goals  work without pain, walk longer distances, be able to shop without pain    Currently in Pain?  Yes    Pain Score  7     Pain Location  Hip    Pain Orientation  Left    Pain Descriptors / Indicators  Throbbing   stabbing   Aggravating Factors   long term positions    Pain Relieving Factors  moving         [facility]  PT Assessment - 07/26/18 0001      Assessment   Medical Diagnosis  pain in L Hip, L knee and R knee    Referring Provider (PT)  Lanae Boast, FNP    Onset Date/Surgical Date  --   years ago that progressively got worse   Hand Dominance  Left    Prior Therapy  no      Precautions   Precautions  None      Restrictions   Weight Bearing Restrictions  No      Balance Screen   Has the patient fallen in the past 6 months  No    Is the patient reluctant to leave their home because of a fear of falling?   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    Leisure  eating with friends, going to movies      Cognition   Overall Cognitive Status  Within Functional Limits for tasks assessed      Posture/Postural Control   Posture Comments  incr lordosis, Rt knee genu valgum; Lt hip elevation      Strength   Overall Strength Comments  NT due to notable pelvic rotation      Palpation   Palpation comment  Rt post rotation      Ambulation/Gait   Gait Comments  antalgic                   [facility] Adult PT Treatment/Exercise - 07/26/18 0001      Lumbar Exercises: Stretches   Lower Trunk Rotation  5 reps    Lower Trunk Rotation Limitations  3s holds      Lumbar Exercises: Supine   Pelvic Tilt Limitations  transverse abdominis engagement   also discussed performing in seated   Other Supine Lumbar Exercises  hooklying ball squeeze with core engagement      Manual Therapy   Manual Therapy  Muscle Energy Technique    Muscle Energy Technique  Rt hip flexors/Lt extensors pelvic rotation             PT Education - 07/26/18 0851    Education Details  anatomy of  condition, POC, HEP, exercise form/rationale, work Personal assistant, postural support    Person(s) Educated  Patient    Methods  Explanation;Demonstration;Tactile cues;Verbal cues;Handout    Comprehension  Verbalized understanding;Need further instruction;Returned demonstration;Verbal cues required;Tactile cues required       PT Short Term Goals - 07/26/18 0953      PT SHORT TERM GOAL #1   Title  pt will be independent in her HEP.     Time  3    Status  On-going    Target Date  08/20/18        PT Long Term Goals - 07/26/18 0813      PT LONG TERM GOAL #1   Title  Pt will improve her SLS on the L LE to >/= 20 seconds.     Baseline  Rt 10+, Lt 2    Status  On-going    Target Date  09/10/18      PT LONG TERM GOAL #2   Title  pt will improve her L hip strength to >/= 4/5 in order to improve functional mobilty.     Baseline  not appropriate to test due to high pain levels and notable pelvic rotation    Target Date  09/10/18      PT LONG TERM GOAL #3   Title  Pt will be able to amb a flight of stairs step over step with pain </= 3/10 using single hand rail.     Baseline  terrible pain    Status  On-going    Target Date  09/10/18      PT LONG TERM GOAL #4   Title  pt will be able to walk for 20 minutes with pain </= 3/10.     Baseline  5-10 min max    Status  On-going    Target Date  09/10/18            Plan - 07/26/18 5427    Clinical Impression Statement  Notable pelvic rotation  today resulting in functional LLD; corrected today utilizing MET.  Reports decrease in concordant pain but was advised that she will be sore. Pt will benefit from a standing desk at work and will ask about required paperwork and let us know at her next visit. Able to contract core in isolated motion but will benefit from training to improve strength, endurance and coordination. Postural practices she will adopt: sit with lumbar support pillow, hold something bw knees, stand with equal weight between  feet.     PT Treatment/Interventions  ADLs/Self Care Home Management;Electrical Stimulation;Moist Heat;Cryotherapy;Ultrasound;Gait training;Stair training;Functional mobility training;Balance training;Therapeutic exercise;Therapeutic activities;Patient/family education;Manual techniques;Passive range of motion;Dry needling;Taping    PT Next Visit Plan  check pelvic rotation, lumbopelvic stability    PT Home Exercise Plan  Transverse abdominis contraction, hooklying ball squeeze, LTR, rt hip isometric flexion PRN.     Consulted and Agree with Plan of Care  Patient       Patient will benefit from skilled therapeutic intervention in order to improve the following deficits and impairments:  Pain, Abnormal gait, Postural dysfunction, Decreased mobility, Decreased activity tolerance, Decreased range of motion, Decreased strength, Impaired flexibility, Decreased balance, Difficulty walking  Visit Diagnosis: Pain in left hip - Plan: PT plan of care cert/re-cert  Chronic pain of left knee - Plan: PT plan of care cert/re-cert  Chronic pain of right knee - Plan: PT plan of care cert/re-cert  Muscle weakness (generalized) - Plan: PT plan of care cert/re-cert  Difficulty in walking, not elsewhere classified - Plan: PT plan of care cert/re-cert     Problem List Patient Active Problem List   Diagnosis Date Noted  . Essential hypertension 06/25/2015  . Prediabetes 06/25/2015  . Sepsis ([place]) 03/07/2015  . Leukocytosis 03/07/2015   Gennesis Hogland C. Jalani Rominger PT, DPT 07/26/18 10:01 AM   [facility] [address] [place], [name], [address] Phone: [phone]   Fax:  [phone]  Name: Michaela Wilson MRN: [id number] Date of Birth: Sep 26, 1965

## 2018-07-26 NOTE — Patient Instructions (Signed)
Access Code: [email or web address]  URL: [email or web address]  Date: 07/26/2018  Prepared by: Selinda Eon   Exercises  Hooklying Transversus Abdominis Palpation  Seated Transversus Abdominis Bracing - 7x weekly  Supine Hip Adduction Isometric with Ball - 10 reps - 1 sets - 5s hold - 2x daily - 7x weekly  Supine Lower Trunk Rotation - 5 reps - 1 sets - 3s hold - 2x daily - 7x weekly  Hooklying Isometric Hip Flexion - 5 reps - 1 sets

## 2018-08-03 ENCOUNTER — Other Ambulatory Visit: Payer: Self-pay

## 2018-08-03 ENCOUNTER — Encounter: Payer: Self-pay | Admitting: Physical Therapy

## 2018-08-03 ENCOUNTER — Ambulatory Visit: Payer: 59 | Admitting: Physical Therapy

## 2018-08-03 DIAGNOSIS — M25552 Pain in left hip: Secondary | ICD-10-CM

## 2018-08-03 DIAGNOSIS — R262 Difficulty in walking, not elsewhere classified: Secondary | ICD-10-CM

## 2018-08-03 DIAGNOSIS — M6281 Muscle weakness (generalized): Secondary | ICD-10-CM

## 2018-08-03 DIAGNOSIS — G8929 Other chronic pain: Secondary | ICD-10-CM

## 2018-08-03 NOTE — Therapy (Signed)
[place], [name], [address] Phone: [phone]   Fax:  [phone]  Physical Therapy Treatment  Patient Details  Name: Michaela Wilson MRN: [id number] Date of Birth: 07/31/1965 Referring Provider (PT): Lanae Boast, FNP   Encounter Date: 08/03/2018  PT End of Session - 08/03/18 1342    Visit Number  3    Number of Visits  14    Authorization Type  UHC    PT Start Time  8099    PT Stop Time  1424    PT Time Calculation (min)  49 min    Activity Tolerance  Patient tolerated treatment well    Behavior During Therapy  [facility] for tasks assessed/performed       Past Medical History:  Diagnosis Date  . Anemia   . Cellulitis of right buttock 03/07/2015  . Hypertension   . Seasonal allergies   . Vertigo     Past Surgical History:  Procedure Laterality Date  . ABCESS DRAINAGE    . CHOLECYSTECTOMY    . TUBAL LIGATION      There were no vitals filed for this visit.  Subjective Assessment - 08/03/18 1341    Subjective  Today is an OK day. Hurting in my knees and hip.  More hip than knees.  L hip 6/10     Currently in Pain?  Yes    Pain Score  6     Pain Location  Hip    Pain Orientation  Left    Pain Type  Chronic pain    Pain Onset  More than a month ago    Pain Frequency  Constant    Aggravating Factors   mal postion , walking     Pain Relieving Factors  exercises help at bit             [facility] Adult PT Treatment/Exercise - 08/03/18 0001      Self-Care   Self-Care  Other Self-Care Comments    Other Self-Care Comments   MET for L ant, Rt post innominate       Lumbar Exercises: Stretches   Active Hamstring Stretch  1 rep;30 seconds    Single Knee to Chest Stretch  Right;Left;3 reps    Lower Trunk Rotation  10 seconds    Lower Trunk Rotation Limitations  x 10 then windshield wipers    Lumbar Stabilization Level 1 Limitations  done on mat    ITB Stretch  Left;1 rep;30 seconds       Lumbar Exercises: Aerobic   Nustep  5 min L 5 UE and LE       Lumbar Exercises: Supine   Pelvic Tilt  10 reps    Pelvic Tilt Limitations  ball squeeze, core contraction     Bridge  10 reps    Bridge Limitations  small ROM with ball       Manual Therapy   Manual therapy comments  followed by Rt supine hip flex stretch off table     Muscle Energy Technique  Rt hip flexors/Lt extensors pelvic rotation             PT Education - 08/03/18 1419    Education Details  work/desk letter, stretching, MET with dowel for ease     Person(s) Educated  Patient    Methods  Explanation;Demonstration    Comprehension  Verbalized understanding;Returned demonstration       PT Short Term Goals - 07/26/18 8338  PT SHORT TERM GOAL #1   Title  pt will be independent in her HEP.     Time  3    Status  On-going    Target Date  08/20/18        PT Long Term Goals - 07/26/18 0813      PT LONG TERM GOAL #1   Title  Pt will improve her SLS on the L LE to >/= 20 seconds.     Baseline  Rt 10+, Lt 2    Status  On-going    Target Date  09/10/18      PT LONG TERM GOAL #2   Title  pt will improve her L hip strength to >/= 4/5 in order to improve functional mobilty.     Baseline  not appropriate to test due to high pain levels and notable pelvic rotation    Target Date  09/10/18      PT LONG TERM GOAL #3   Title  Pt will be able to amb a flight of stairs step over step with pain </= 3/10 using single hand rail.     Baseline  terrible pain    Status  On-going    Target Date  09/10/18      PT LONG TERM GOAL #4   Title  pt will be able to walk for 20 minutes with pain </= 3/10.     Baseline  5-10 min max    Status  On-going    Target Date  09/10/18            Plan - 08/03/18 1342    Clinical Impression Statement  Pt found out that PT could write for a stand up desk, has one in mind.  I will draft a letter to assist in the process.  Corrected pelvic rotation again.  Has had overall  less pain in Rt LS spine. Worked on reducing tension in hips and core strength .  Tightness in hamstrings, ITB limited bilateral but pain mostly on L side. MHP improved soreness post session.     PT Treatment/Interventions  ADLs/Self Care Home Management;Electrical Stimulation;Moist Heat;Cryotherapy;Ultrasound;Gait training;Stair training;Functional mobility training;Balance training;Therapeutic exercise;Therapeutic activities;Patient/family education;Manual techniques;Passive range of motion;Dry needling;Taping    PT Next Visit Plan  check pelvic rotation, lumbopelvic stability.  Use ball and initial lumbar stab.     PT Home Exercise Plan  Transverse abdominis contraction, hooklying ball squeeze, LTR, rt hip isometric flexion PRN.     Consulted and Agree with Plan of Care  Patient       Patient will benefit from skilled therapeutic intervention in order to improve the following deficits and impairments:  Pain, Abnormal gait, Postural dysfunction, Decreased mobility, Decreased activity tolerance, Decreased range of motion, Decreased strength, Impaired flexibility, Decreased balance, Difficulty walking  Visit Diagnosis: Pain in left hip  Chronic pain of left knee  Chronic pain of right knee  Muscle weakness (generalized)  Difficulty in walking, not elsewhere classified     Problem List Patient Active Problem List   Diagnosis Date Noted  . Essential hypertension 06/25/2015  . Prediabetes 06/25/2015  . Sepsis ([place]) 03/07/2015  . Leukocytosis 03/07/2015    Doral Ventrella 08/03/2018, 2:21 PM  [facility] [address] [place], [name], [address] Phone: [phone]   Fax:  [phone]  Name: ALYONA ROMACK MRN: [id number] Date of Birth: 04-Sep-1965  Raeford Razor, PT 08/03/18 2:22 PM Phone: [phone] Fax: [phone]

## 2018-08-05 ENCOUNTER — Ambulatory Visit: Payer: 59 | Admitting: Physical Therapy

## 2018-08-05 ENCOUNTER — Other Ambulatory Visit: Payer: Self-pay

## 2018-08-05 DIAGNOSIS — M25552 Pain in left hip: Secondary | ICD-10-CM | POA: Diagnosis not present

## 2018-08-05 DIAGNOSIS — G8929 Other chronic pain: Secondary | ICD-10-CM

## 2018-08-05 DIAGNOSIS — R262 Difficulty in walking, not elsewhere classified: Secondary | ICD-10-CM

## 2018-08-05 DIAGNOSIS — M25561 Pain in right knee: Secondary | ICD-10-CM

## 2018-08-05 DIAGNOSIS — M6281 Muscle weakness (generalized): Secondary | ICD-10-CM

## 2018-08-05 NOTE — Therapy (Signed)
[place], [name], [address] Phone: [phone]   Fax:  [phone]  Physical Therapy Treatment  Patient Details  Name: Michaela Wilson MRN: [id number] Date of Birth: 09/24/1965 Referring Provider (PT): Lanae Boast, FNP   Encounter Date: 08/05/2018  PT End of Session - 08/05/18 1533    Visit Number  4    Number of Visits  14    Authorization Type  UHC    PT Start Time  1340    PT Stop Time  1430    PT Time Calculation (min)  50 min    Activity Tolerance  Patient tolerated treatment well    Behavior During Therapy  [facility] for tasks assessed/performed       Past Medical History:  Diagnosis Date  . Anemia   . Cellulitis of right buttock 03/07/2015  . Hypertension   . Seasonal allergies   . Vertigo     Past Surgical History:  Procedure Laterality Date  . ABCESS DRAINAGE    . CHOLECYSTECTOMY    . TUBAL LIGATION      There were no vitals filed for this visit.      [facility] Adult PT Treatment/Exercise - 08/05/18 0001      Self-Care   Other Self-Care Comments   manual therapy, RICE, anti-inflammation      Lumbar Exercises: Stretches   Lower Trunk Rotation  60 seconds    Lower Trunk Rotation Limitations  with knees crossed     ITB Stretch  Left;3 reps;30 seconds    Piriformis Stretch  Right;Left;3 reps;30 seconds    Figure 4 Stretch  3 reps;30 seconds      Lumbar Exercises: Sidelying   Other Sidelying Lumbar Exercises  quadratus lumborum/ITB x 60 sec L. side      Modalities   Modalities  Cryotherapy      Cryotherapy   Number Minutes Cryotherapy  8 Minutes    Cryotherapy Location  Hip    Type of Cryotherapy  Ice pack      Manual Therapy   Manual Therapy  Soft tissue mobilization;Myofascial release    Soft tissue mobilization  L ITB, L piriformis, gluteals, hamstrings and rectus femoris     Myofascial Release  L hip               PT Short Term Goals - 07/26/18 3875      PT  SHORT TERM GOAL #1   Title  pt will be independent in her HEP.     Time  3    Status  On-going    Target Date  08/20/18        PT Long Term Goals - 07/26/18 0813      PT LONG TERM GOAL #1   Title  Pt will improve her SLS on the L LE to >/= 20 seconds.     Baseline  Rt 10+, Lt 2    Status  On-going    Target Date  09/10/18      PT LONG TERM GOAL #2   Title  pt will improve her L hip strength to >/= 4/5 in order to improve functional mobilty.     Baseline  not appropriate to test due to high pain levels and notable pelvic rotation    Target Date  09/10/18      PT LONG TERM GOAL #3   Title  Pt will be able to amb a flight of stairs step over step with  pain </= 3/10 using single hand rail.     Baseline  terrible pain    Status  On-going    Target Date  09/10/18      PT LONG TERM GOAL #4   Title  pt will be able to walk for 20 minutes with pain </= 3/10.     Baseline  5-10 min max    Status  On-going    Target Date  09/10/18            Plan - 08/05/18 1411    Clinical Impression Statement  Patient given note for stand up desk.  She had increased pain today in LLE , mainly posterolateral/ITB and piriformis.  Worked on manual with IASTM and strethcing to relieve pain.  She takes no anti-inflammatories but may try some ice for pain.     PT Treatment/Interventions  ADLs/Self Care Home Management;Electrical Stimulation;Moist Heat;Cryotherapy;Ultrasound;Gait training;Stair training;Functional mobility training;Balance training;Therapeutic exercise;Therapeutic activities;Patient/family education;Manual techniques;Passive range of motion;Dry needling;Taping    PT Next Visit Plan  cont manual, stretch to L hip, Lumbopelvic stab, modalities for pain     PT Home Exercise Plan  Transverse abdominis contraction, hooklying ball squeeze, LTR, rt hip isometric flexion PRN. , piriformis     Consulted and Agree with Plan of Care  Patient       Patient will benefit from skilled therapeutic  intervention in order to improve the following deficits and impairments:  Pain, Abnormal gait, Postural dysfunction, Decreased mobility, Decreased activity tolerance, Decreased range of motion, Decreased strength, Impaired flexibility, Decreased balance, Difficulty walking  Visit Diagnosis: Pain in left hip  Chronic pain of left knee  Chronic pain of right knee  Muscle weakness (generalized)  Difficulty in walking, not elsewhere classified     Problem List Patient Active Problem List   Diagnosis Date Noted  . Essential hypertension 06/25/2015  . Prediabetes 06/25/2015  . Sepsis ([place]) 03/07/2015  . Leukocytosis 03/07/2015    Arriona Prest 08/05/2018, 3:38 PM  [facility] [address] [place], [name], [address] Phone: [phone]   Fax:  [phone]  Name: Michaela Wilson MRN: [id number] Date of Birth: December 13, 1965  Raeford Razor, PT 08/05/18 3:38 PM Phone: [phone] Fax: [phone]

## 2018-08-10 ENCOUNTER — Other Ambulatory Visit: Payer: Self-pay

## 2018-08-10 ENCOUNTER — Ambulatory Visit: Payer: 59 | Admitting: Physical Therapy

## 2018-08-10 DIAGNOSIS — M6281 Muscle weakness (generalized): Secondary | ICD-10-CM

## 2018-08-10 DIAGNOSIS — M25552 Pain in left hip: Secondary | ICD-10-CM | POA: Diagnosis not present

## 2018-08-10 DIAGNOSIS — G8929 Other chronic pain: Secondary | ICD-10-CM

## 2018-08-10 DIAGNOSIS — R262 Difficulty in walking, not elsewhere classified: Secondary | ICD-10-CM

## 2018-08-10 NOTE — Patient Instructions (Signed)
Prepared By: [place], [name]  Phone: [phone]  Step 1  Step 2  Step 3  Standing Hip Flexor Stretch reps: 5  sets: 1  hold: 30  daily: 2  weekly: 7 Setup  Begin in a staggered stance position with your hands resting on your hips and the leg you are going to stretch positioned behind your body. Movement  Keeping your back straight and upright, squeeze your buttock muscles and slowly shift your weight forward until you feel a gentle stretch in the front of your hip.  Tip  Make sure to keep your hips and shoulders facing forward and do not arch your low back during the stretch. Step 1  Step 2  Standing Hip Abduction reps: 10  sets: 2  hold: 5  daily: 1  weekly: 7 Setup  Begin in a standing upright position holding onto a stable object for support. Movement  Lift one leg out to your side, then slowly return to the starting position and repeat. Tip  Make sure to keep your shoulders and hips facing straight forward during the exercise. Step 1  Step 2  Supine Bridge reps: 10  sets: 2  hold: 5  daily: 1  weekly: 7 Setup  Begin lying on your back with your arms resting at your sides, your legs bent at the knees and your feet flat on the ground. Movement  Tighten your abdominals and slowly lift your hips off the floor into a bridge position, keeping your back straight. Tip  Make sure to keep your trunk stiff throughout the exercise and your arms flat on the floor.

## 2018-08-10 NOTE — Therapy (Signed)
[place], [name], [address] Phone: [phone]   Fax:  [phone]  Physical Therapy Treatment  Patient Details  Name: Michaela Wilson MRN: [id number] Date of Birth: April 18, 1965 Referring Provider (PT): Lanae Boast, FNP   Encounter Date: 08/10/2018  PT End of Session - 08/10/18 1636    Visit Number  5    Number of Visits  14    Authorization Type  UHC    PT Start Time  9892    PT Stop Time  1430    PT Time Calculation (min)  53 min    Activity Tolerance  Patient tolerated treatment well    Behavior During Therapy  [facility] for tasks assessed/performed       Past Medical History:  Diagnosis Date  . Anemia   . Cellulitis of right buttock 03/07/2015  . Hypertension   . Seasonal allergies   . Vertigo     Past Surgical History:  Procedure Laterality Date  . ABCESS DRAINAGE    . CHOLECYSTECTOMY    . TUBAL LIGATION      There were no vitals filed for this visit.  Subjective Assessment - 08/10/18 1355    Subjective  5/10 like always.  NO pain in R hip.  Back hurt yesterday but not today.      Currently in Pain?  Yes    Pain Score  5     Pain Location  Hip    Pain Orientation  Left    Pain Type  Chronic pain    Pain Onset  More than a month ago    Pain Frequency  Constant    Aggravating Factors   activity    Pain Relieving Factors  exercises             [facility] Adult PT Treatment/Exercise - 08/10/18 0001      Lumbar Exercises: Supine   Ab Set  10 reps    Pelvic Tilt  10 reps    Pelvic Tilt Limitations  over ball     Clam  15 reps    Bent Knee Raise  15 reps    Bridge  10 reps      Knee/Hip Exercises: Stretches   Active Hamstring Stretch  Both;3 reps    ITB Stretch  Both;1 rep    Piriformis Stretch  Both;2 reps      Knee/Hip Exercises: Standing   Hip Flexion  Stengthening;Both;1 set;10 reps    Hip Abduction  Stengthening;Both;1 set;10 reps    Lateral Step Up  Both;1 set;20  reps;Hand Hold: 1;Step Height: 4"    Functional Squat  1 set;10 reps    SLS  painful, shaky, unable to do . 5 sec LL E      Cryotherapy   Number Minutes Cryotherapy  10 Minutes    Cryotherapy Location  Hip    Type of Cryotherapy  Ice pack             PT Education - 08/10/18 1358    Education Details  core, lower ab     Person(s) Educated  Patient    Methods  Explanation    Comprehension  Verbalized understanding       PT Short Term Goals - 08/10/18 1412      PT SHORT TERM GOAL #1   Title  pt will be independent in her HEP.     Status  On-going        PT Long Term  Goals - 07/26/18 0813      PT LONG TERM GOAL #1   Title  Pt will improve her SLS on the L LE to >/= 20 seconds.     Baseline  Rt 10+, Lt 2    Status  On-going    Target Date  09/10/18      PT LONG TERM GOAL #2   Title  pt will improve her L hip strength to >/= 4/5 in order to improve functional mobilty.     Baseline  not appropriate to test due to high pain levels and notable pelvic rotation    Target Date  09/10/18      PT LONG TERM GOAL #3   Title  Pt will be able to amb a flight of stairs step over step with pain </= 3/10 using single hand rail.     Baseline  terrible pain    Status  On-going    Target Date  09/10/18      PT LONG TERM GOAL #4   Title  pt will be able to walk for 20 minutes with pain </= 3/10.     Baseline  5-10 min max    Status  On-going    Target Date  09/10/18            Plan - 08/10/18 1634    Clinical Impression Statement  Pt with L sided LE weakness today with standing exercises, shaky and fatigued with simple step ups and balance.  Upgraded HEP.  Goals in progress.      PT Treatment/Interventions  ADLs/Self Care Home Management;Electrical Stimulation;Moist Heat;Cryotherapy;Ultrasound;Gait training;Stair training;Functional mobility training;Balance training;Therapeutic exercise;Therapeutic activities;Patient/family education;Manual techniques;Passive range of  motion;Dry needling;Taping    PT Next Visit Plan  cont manual, stretch to L hip, Lumbopelvic stab, modalities for pain     PT Home Exercise Plan  Transverse abdominis contraction, hooklying ball squeeze, LTR, rt hip isometric flexion PRN. , piriformis , hip abd    Consulted and Agree with Plan of Care  Patient       Patient will benefit from skilled therapeutic intervention in order to improve the following deficits and impairments:  Pain, Abnormal gait, Postural dysfunction, Decreased mobility, Decreased activity tolerance, Decreased range of motion, Decreased strength, Impaired flexibility, Decreased balance, Difficulty walking  Visit Diagnosis: Pain in left hip  Chronic pain of left knee  Chronic pain of right knee  Muscle weakness (generalized)  Difficulty in walking, not elsewhere classified     Problem List Patient Active Problem List   Diagnosis Date Noted  . Essential hypertension 06/25/2015  . Prediabetes 06/25/2015  . Sepsis ([place]) 03/07/2015  . Leukocytosis 03/07/2015    Stefany Starace 08/10/2018, 4:38 PM  [facility] [address] [place], [name], [address] Phone: [phone]   Fax:  [phone]  Name: JEANNETTE MADDY MRN: [id number] Date of Birth: 04/08/1965

## 2018-08-12 ENCOUNTER — Ambulatory Visit: Payer: 59 | Admitting: Physical Therapy

## 2018-08-12 ENCOUNTER — Other Ambulatory Visit: Payer: Self-pay

## 2018-08-12 DIAGNOSIS — R262 Difficulty in walking, not elsewhere classified: Secondary | ICD-10-CM

## 2018-08-12 DIAGNOSIS — M25562 Pain in left knee: Secondary | ICD-10-CM

## 2018-08-12 DIAGNOSIS — M25552 Pain in left hip: Secondary | ICD-10-CM | POA: Diagnosis not present

## 2018-08-12 DIAGNOSIS — G8929 Other chronic pain: Secondary | ICD-10-CM

## 2018-08-12 DIAGNOSIS — M6281 Muscle weakness (generalized): Secondary | ICD-10-CM

## 2018-08-12 NOTE — Therapy (Signed)
[place], [name], [address] Phone: [phone]   Fax:  [phone]  Physical Therapy Treatment  Patient Details  Name: Michaela Wilson MRN: [id number] Date of Birth: April 24, 1965 Referring Provider (PT): Lanae Boast, FNP   Encounter Date: 08/12/2018  PT End of Session - 08/12/18 1412    Visit Number  6    Number of Visits  14    Authorization Type  UHC    PT Start Time  1340    PT Stop Time  1425    PT Time Calculation (min)  45 min    Activity Tolerance  Patient tolerated treatment well    Behavior During Therapy  [facility] for tasks assessed/performed       Past Medical History:  Diagnosis Date  . Anemia   . Cellulitis of right buttock 03/07/2015  . Hypertension   . Seasonal allergies   . Vertigo     Past Surgical History:  Procedure Laterality Date  . ABCESS DRAINAGE    . CHOLECYSTECTOMY    . TUBAL LIGATION      There were no vitals filed for this visit.  Subjective Assessment - 08/12/18 1413    Subjective  Today is the best day.  Yesterday was really bad, I almost had to go home.     Currently in Pain?  Yes    Pain Score  3     Pain Location  Hip    Pain Orientation  Left;Lateral    Pain Descriptors / Indicators  Aching    Pain Type  Chronic pain    Pain Onset  More than a month ago         [facility] Adult PT Treatment/Exercise - 08/12/18 0001      Lumbar Exercises: Stretches   Other Lumbar Stretch Exercise  sidelying book openings x 5 each side much easier opening to Rt.       Lumbar Exercises: Sidelying   Clam  Both;15 reps    Hip Abduction  Both;10 reps    Other Sidelying Lumbar Exercises  adduction x 10 each side       Knee/Hip Exercises: Stretches   ITB Stretch and hamstring   Both;2 reps    Piriformis Stretch  Both;2 reps    Piriformis Stretch Limitations  knee down then knee across       Knee/Hip Exercises: Machines for Strengthening   Cybex Leg Press  1 plate bilateral  x 2 x 10  , then 1 plate single leg x 10                PT Short Term Goals - 08/12/18 1412      PT SHORT TERM GOAL #1   Title  pt will be independent in her HEP.     Status  Achieved        PT Long Term Goals - 08/12/18 1412      PT LONG TERM GOAL #1   Title  Pt will improve her SLS on the L LE to >/= 20 seconds.     Status  On-going      PT LONG TERM GOAL #2   Title  pt will improve her L hip strength to >/= 4/5 in order to improve functional mobilty.     Status  On-going      PT LONG TERM GOAL #3   Title  Pt will be able to amb a flight of stairs step over step with  pain </= 3/10 using single hand rail.     Status  On-going      PT LONG TERM GOAL #4   Title  pt will be able to walk for 20 minutes with pain </= 3/10.     Status  On-going            Plan - 08/12/18 1413    Clinical Impression Statement  Pt continues to benefit from PT to provide corrective exercises.  Worked on stabilizing pelvis in all positions to strengthen hip.  Ice to address L hip inflammation.     PT Treatment/Interventions  ADLs/Self Care Home Management;Electrical Stimulation;Moist Heat;Cryotherapy;Ultrasound;Gait training;Stair training;Functional mobility training;Balance training;Therapeutic exercise;Therapeutic activities;Patient/family education;Manual techniques;Passive range of motion;Dry needling;Taping    PT Next Visit Plan  cont manual, stretch to L hip, Lumbopelvic stab, modalities for pain [place]?     PT Home Exercise Plan  Transverse abdominis contraction, hooklying ball squeeze, LTR, rt hip isometric flexion PRN. , piriformis , hip abd    Consulted and Agree with Plan of Care  Patient       Patient will benefit from skilled therapeutic intervention in order to improve the following deficits and impairments:     Visit Diagnosis: Pain in left hip  Chronic pain of left knee  Muscle weakness (generalized)  Chronic pain of right knee  Difficulty in walking, not  elsewhere classified     Problem List Patient Active Problem List   Diagnosis Date Noted  . Essential hypertension 06/25/2015  . Prediabetes 06/25/2015  . Sepsis ([place]) 03/07/2015  . Leukocytosis 03/07/2015    , 08/12/2018, 2:24 PM  [facility] [address] [place], [name], [address] Phone: [phone]   Fax:  [phone]  Name: CHYRL ELWELL MRN: [id number] Date of Birth: Aug 15, 1965  Raeford Razor, PT 08/12/18 2:24 PM Phone: [phone] Fax: [phone]

## 2018-08-17 ENCOUNTER — Ambulatory Visit: Payer: 59 | Attending: Family Medicine | Admitting: Physical Therapy

## 2018-08-17 ENCOUNTER — Encounter: Payer: Self-pay | Admitting: Physical Therapy

## 2018-08-17 ENCOUNTER — Other Ambulatory Visit: Payer: Self-pay

## 2018-08-17 DIAGNOSIS — M25561 Pain in right knee: Secondary | ICD-10-CM | POA: Insufficient documentation

## 2018-08-17 DIAGNOSIS — M6281 Muscle weakness (generalized): Secondary | ICD-10-CM | POA: Insufficient documentation

## 2018-08-17 DIAGNOSIS — R262 Difficulty in walking, not elsewhere classified: Secondary | ICD-10-CM | POA: Insufficient documentation

## 2018-08-17 DIAGNOSIS — M25552 Pain in left hip: Secondary | ICD-10-CM

## 2018-08-17 DIAGNOSIS — M25562 Pain in left knee: Secondary | ICD-10-CM | POA: Diagnosis present

## 2018-08-17 DIAGNOSIS — G8929 Other chronic pain: Secondary | ICD-10-CM | POA: Diagnosis present

## 2018-08-17 NOTE — Therapy (Signed)
[place], [name], [address] Phone: [phone]   Fax:  [phone]  Physical Therapy Treatment  Patient Details  Name: Michaela Wilson MRN: [id number] Date of Birth: 12/09/1965 Referring Provider (PT): Lanae Boast, FNP   Encounter Date: 08/17/2018  PT End of Session - 08/17/18 0930    Visit Number  7    Number of Visits  14    Authorization Type  UHC    PT Start Time  0908    PT Stop Time  0957    PT Time Calculation (min)  49 min    Activity Tolerance  Patient tolerated treatment well    Behavior During Therapy  [facility] for tasks assessed/performed       Past Medical History:  Diagnosis Date  . Anemia   . Cellulitis of right buttock 03/07/2015  . Hypertension   . Seasonal allergies   . Vertigo     Past Surgical History:  Procedure Laterality Date  . ABCESS DRAINAGE    . CHOLECYSTECTOMY    . TUBAL LIGATION      There were no vitals filed for this visit.  Subjective Assessment - 08/17/18 0912    Subjective  Pain in L hip is 4/10.  Waiting on stand up desk.     Currently in Pain?  Yes    Pain Score  4     Pain Location  Hip    Pain Orientation  Left;Lateral    Pain Descriptors / Indicators  Aching    Pain Type  Chronic pain    Pain Onset  More than a month ago    Pain Frequency  Constant    Aggravating Factors   activity    Pain Relieving Factors  exercise, stretching          [facility] Adult PT Treatment/Exercise - 08/17/18 0001      Lumbar Exercises: Aerobic   Nustep  6 min L5 UE and LE       Knee/Hip Exercises: Standing   Hip Abduction  Stengthening    Abduction Limitations  blue band x 15 each leg     Functional Squat  1 set;10 reps    Functional Squat Limitations  with band       Knee/Hip Exercises: Seated   Sit to Sand  10 reps;without UE support   band around thighs     Modalities   Modalities  Ultrasound      Cryotherapy   Number Minutes Cryotherapy  10 Minutes    Cryotherapy Location  Hip    Type of Cryotherapy  Ice pack      Ultrasound   Ultrasound Location  L Gr Trochanter     Ultrasound Parameters  50% , 1.5 W/cm2, 1 MHz, mod pressure with sound head, very tender    8 min and then brief soft tissue to surrounding area post    Ultrasound Goals  Pain             PT Education - 08/17/18 0951    Education Details  ultrasound, bursitis    Person(s) Educated  Patient    Methods  Explanation    Comprehension  Verbalized understanding       PT Short Term Goals - 08/12/18 1412      PT SHORT TERM GOAL #1   Title  pt will be independent in her HEP.     Status  Achieved        PT Long  Term Goals - 08/12/18 1412      PT LONG TERM GOAL #1   Title  Pt will improve her SLS on the L LE to >/= 20 seconds.     Status  On-going      PT LONG TERM GOAL #2   Title  pt will improve her L hip strength to >/= 4/5 in order to improve functional mobilty.     Status  On-going      PT LONG TERM GOAL #3   Title  Pt will be able to amb a flight of stairs step over step with pain </= 3/10 using single hand rail.     Status  On-going      PT LONG TERM GOAL #4   Title  pt will be able to walk for 20 minutes with pain </= 3/10.     Status  On-going            Plan - 08/17/18 9163    Clinical Impression Statement  Worked on lateral hip stability in standing today.  Added in Ultrasound to impact inflammation and pain at Greater trochanter. Does not tolerate manual beyond light soft tissue work.     PT Treatment/Interventions  ADLs/Self Care Home Management;Electrical Stimulation;Moist Heat;Cryotherapy;Ultrasound;Gait training;Stair training;Functional mobility training;Balance training;Therapeutic exercise;Therapeutic activities;Patient/family education;Manual techniques;Passive range of motion;Dry needling;Taping    PT Next Visit Plan  check goals, cont with lumbopelvic stab (check alignment), repeat US, STW to L hip     PT Home Exercise Plan   Transverse abdominis contraction, hooklying ball squeeze, LTR, rt hip isometric flexion PRN. , piriformis , hip abd    Consulted and Agree with Plan of Care  Patient       Patient will benefit from skilled therapeutic intervention in order to improve the following deficits and impairments:  Pain, Abnormal gait, Postural dysfunction, Decreased mobility, Decreased activity tolerance, Decreased range of motion, Decreased strength, Impaired flexibility, Decreased balance, Difficulty walking  Visit Diagnosis: Pain in left hip  Chronic pain of left knee  Muscle weakness (generalized)  Chronic pain of right knee  Difficulty in walking, not elsewhere classified     Problem List Patient Active Problem List   Diagnosis Date Noted  . Essential hypertension 06/25/2015  . Prediabetes 06/25/2015  . Sepsis ([place]) 03/07/2015  . Leukocytosis 03/07/2015    [name],[name] 08/17/2018, 9:56 AM  [facility] [address] [place], [name], [address] Phone: [phone]   Fax:  [phone]  Name: Michaela Wilson MRN: [id number] Date of Birth: 24-Jun-1965  Raeford Razor, PT 08/17/18 9:56 AM Phone: [phone] Fax: [phone]

## 2018-08-19 ENCOUNTER — Encounter: Payer: Self-pay | Admitting: Physical Therapy

## 2018-08-19 ENCOUNTER — Ambulatory Visit: Payer: 59 | Admitting: Physical Therapy

## 2018-08-19 ENCOUNTER — Other Ambulatory Visit: Payer: Self-pay

## 2018-08-19 DIAGNOSIS — M25552 Pain in left hip: Secondary | ICD-10-CM

## 2018-08-19 DIAGNOSIS — R262 Difficulty in walking, not elsewhere classified: Secondary | ICD-10-CM

## 2018-08-19 DIAGNOSIS — M6281 Muscle weakness (generalized): Secondary | ICD-10-CM

## 2018-08-19 DIAGNOSIS — M25562 Pain in left knee: Secondary | ICD-10-CM

## 2018-08-19 DIAGNOSIS — G8929 Other chronic pain: Secondary | ICD-10-CM

## 2018-08-19 NOTE — Therapy (Signed)
[place], [name], [address] Phone: [phone]   Fax:  [phone]  Physical Therapy Treatment  Patient Details  Name: Michaela Wilson MRN: [id number] Date of Birth: 08-11-1965 Referring Provider (PT): Lanae Boast, FNP   Encounter Date: 08/19/2018  PT End of Session - 08/19/18 0918    Visit Number  8    Number of Visits  14    Authorization Type  UHC    PT Start Time  0831    PT Stop Time  0932    PT Time Calculation (min)  61 min    Activity Tolerance  Patient tolerated treatment well    Behavior During Therapy  [facility] for tasks assessed/performed       Past Medical History:  Diagnosis Date  . Anemia   . Cellulitis of right buttock 03/07/2015  . Hypertension   . Seasonal allergies   . Vertigo     Past Surgical History:  Procedure Laterality Date  . ABCESS DRAINAGE    . CHOLECYSTECTOMY    . TUBAL LIGATION      There were no vitals filed for this visit.  Subjective Assessment - 08/19/18 0841    Subjective  Patient had a bad day yesterday.  Hurt on both hips and L knee.      Currently in Pain?  Yes    Pain Score  4     Pain Location  Hip    Pain Orientation  Left    Pain Descriptors / Indicators  Aching    Pain Type  Chronic pain    Pain Onset  More than a month ago    Pain Frequency  Constant           [facility] Adult PT Treatment/Exercise - 08/19/18 0001      Lumbar Exercises: Stretches   Active Hamstring Stretch  2 reps;30 seconds    Single Knee to Chest Stretch  2 reps;30 seconds    Lower Trunk Rotation  10 seconds    Lower Trunk Rotation Limitations  x 10 knees crossed       Lumbar Exercises: Aerobic   Nustep  6 min L5 UE and LE       Lumbar Exercises: Supine   Clam  10 reps    Clam Limitations  3 sets , bilat and unilateral     Bridge  10 reps      Modalities   Modalities  Cryotherapy;Teacher, English as a foreign language  Location  L hip lateral     Chartered certified accountant  IFC     Electrical Stimulation Parameters  to tol    Electrical Stimulation Goals  Pain      Manual Therapy   Myofascial Release  Long axis distraction bilateral hips x 5     Muscle Energy Technique  MET for Rt hip flex and L hip extension, done x 6 each side and demo in sitting for HEP , patient was doing both sides but had stopped doing it              PT Education - 08/19/18 0917    Education Details  IFC, home TENS and MET for pelvic rotation    Person(s) Educated  Patient    Methods  Explanation;Demonstration    Comprehension  Verbalized understanding;Returned demonstration       PT Short Term Goals - 08/12/18 1412      PT SHORT  TERM GOAL #1   Title  pt will be independent in her HEP.     Status  Achieved        PT Long Term Goals - 08/12/18 1412      PT LONG TERM GOAL #1   Title  Pt will improve her SLS on the L LE to >/= 20 seconds.     Status  On-going      PT LONG TERM GOAL #2   Title  pt will improve her L hip strength to >/= 4/5 in order to improve functional mobilty.     Status  On-going      PT LONG TERM GOAL #3   Title  Pt will be able to amb a flight of stairs step over step with pain </= 3/10 using single hand rail.     Status  On-going      PT LONG TERM GOAL #4   Title  pt will be able to walk for 20 minutes with pain </= 3/10.     Status  On-going            Plan - 08/19/18 9242    Clinical Impression Statement  Pt with continued pain and tenderness in bilateal hips at ASIS and Gr. Trochanter, L >R.  She has some "good days" but also some days with pain. Her knee is popping more, had pain at patellar tendon and medial joint line.  Worked to correct pelvic rotation and provided IFC for pain control and possibly for her to purchasefor home use.      PT Treatment/Interventions  ADLs/Self Care Home Management;Electrical Stimulation;Moist Heat;Cryotherapy;Ultrasound;Gait training;Stair  training;Functional mobility training;Balance training;Therapeutic exercise;Therapeutic activities;Patient/family education;Manual techniques;Passive range of motion;Dry needling;Taping    PT Next Visit Plan  check goals, FOTO, cont with lumbopelvic stab (check alignment), repeat US, STW to L hip     PT Home Exercise Plan  Transverse abdominis contraction, hooklying ball squeeze, LTR, rt hip isometric flexion PRN. , piriformis , hip abd    Consulted and Agree with Plan of Care  Patient       Patient will benefit from skilled therapeutic intervention in order to improve the following deficits and impairments:  Pain, Abnormal gait, Postural dysfunction, Decreased mobility, Decreased activity tolerance, Decreased range of motion, Decreased strength, Impaired flexibility, Decreased balance, Difficulty walking  Visit Diagnosis: Pain in left hip  Chronic pain of left knee  Muscle weakness (generalized)  Chronic pain of right knee  Difficulty in walking, not elsewhere classified     Problem List Patient Active Problem List   Diagnosis Date Noted  . Essential hypertension 06/25/2015  . Prediabetes 06/25/2015  . Sepsis ([place]) 03/07/2015  . Leukocytosis 03/07/2015    [name],[name] 08/19/2018, 9:24 AM  [facility] [address] [place], [name], [address] Phone: [phone]   Fax:  [phone]  Name: Michaela Wilson MRN: [id number] Date of Birth: 04/28/1965  Raeford Razor, PT 08/19/18 9:25 AM Phone: [phone] Fax: [phone]

## 2018-08-19 NOTE — Patient Instructions (Signed)

## 2018-08-24 ENCOUNTER — Encounter: Payer: Self-pay | Admitting: Physical Therapy

## 2018-08-24 ENCOUNTER — Ambulatory Visit: Payer: 59 | Admitting: Physical Therapy

## 2018-08-24 ENCOUNTER — Other Ambulatory Visit: Payer: Self-pay

## 2018-08-24 DIAGNOSIS — M25552 Pain in left hip: Secondary | ICD-10-CM | POA: Diagnosis not present

## 2018-08-24 DIAGNOSIS — G8929 Other chronic pain: Secondary | ICD-10-CM

## 2018-08-24 DIAGNOSIS — M25562 Pain in left knee: Secondary | ICD-10-CM

## 2018-08-24 DIAGNOSIS — R262 Difficulty in walking, not elsewhere classified: Secondary | ICD-10-CM

## 2018-08-24 DIAGNOSIS — M6281 Muscle weakness (generalized): Secondary | ICD-10-CM

## 2018-08-24 NOTE — Therapy (Signed)
[place], [name], [address] Phone: [phone]   Fax:  [phone]  Physical Therapy Treatment  Patient Details  Name: Michaela Wilson MRN: [id number] Date of Birth: 1965/04/17 Referring Provider (PT): Lanae Boast, FNP   Encounter Date: 08/24/2018  PT End of Session - 08/24/18 0817    Visit Number  9    Number of Visits  14    Authorization Type  UHC    PT Start Time  0808    PT Stop Time  0900    PT Time Calculation (min)  52 min    Activity Tolerance  Patient tolerated treatment well    Behavior During Therapy  [facility] for tasks assessed/performed       Past Medical History:  Diagnosis Date  . Anemia   . Cellulitis of right buttock 03/07/2015  . Hypertension   . Seasonal allergies   . Vertigo     Past Surgical History:  Procedure Laterality Date  . ABCESS DRAINAGE    . CHOLECYSTECTOMY    . TUBAL LIGATION      There were no vitals filed for this visit.  Subjective Assessment - 08/24/18 0815    Subjective  Doing well today.  Has been good since last week.  Did not rate pain in hip.     How long can you stand comfortably?  15-20 min     How long can you walk comfortably?  less than 15 min if she went for a purposeful walk for fitness     Diagnostic tests  MRI performed    Patient Stated Goals  work without pain, walk longer distances, be able to shop without pain    Currently in Pain?  No/denies               [facility] Adult PT Treatment/Exercise - 08/24/18 0001      Ambulation/Gait   Stairs  Yes    Stairs Assistance  6: Modified independent (Device/Increase time)    Stair Management Technique  One rail Right;Alternating pattern    Number of Stairs  24    Height of Stairs  --   4 inch, 6 inch    Gait Comments  min pain LLE       Knee/Hip Exercises: Stretches   Piriformis Stretch Limitations  post sidelying exercises x 2       Knee/Hip Exercises: Standing   SLS  worked on SLS  with no UE assist each side multiple reps    see goals      Knee/Hip Exercises: Sidelying   Hip ABduction  Strengthening;Both;2 sets;10 reps    Clams  x 10    Other Sidelying Knee/Hip Exercises  bent knee x 10       Cryotherapy   Number Minutes Cryotherapy  15 Minutes    Cryotherapy Location  Hip    Type of Cryotherapy  Ice pack      Electrical Stimulation   Electrical Stimulation Location  L hip lateral     Electrical Stimulation Action  IFC     Electrical Stimulation Parameters  to tol    Electrical Stimulation Goals  Pain             PT Education - 08/24/18 0848    Education Details  SLS, progress overall with PT     Person(s) Educated  Patient    Methods  Explanation    Comprehension  Verbalized understanding  PT Short Term Goals - 08/12/18 1412      PT SHORT TERM GOAL #1   Title  pt will be independent in her HEP.     Status  Achieved        PT Long Term Goals - 08/24/18 0819      PT LONG TERM GOAL #1   Title  Pt will improve her SLS on the L LE to >/= 20 seconds.     Baseline  Rt 20 sec, Lt. 10 sec (multiple trials)     Status  On-going      PT LONG TERM GOAL #2   Title  pt will improve her L hip strength to >/= 4/5 in order to improve functional mobilty.       PT LONG TERM GOAL #3   Title  Pt will be able to amb a flight of stairs step over step with pain </= 3/10 using single hand rail.     Baseline  better, can demo with controlled pain in AM but not in PM     Status  On-going      PT LONG TERM GOAL #4   Title  pt will be able to walk for 20 minutes with pain </= 3/10.     Baseline  15-20 min pain increased > 5/10    Status  On-going            Plan - 08/24/18 0817    Clinical Impression Statement  Patient overall having less pain.  Can walk and stand a bit longer now.  She has improved her SLS time.  Did have benefit from [facility] so we repeated that today.     PT Treatment/Interventions  ADLs/Self Care Home Management;Electrical  Stimulation;Moist Heat;Cryotherapy;Ultrasound;Gait training;Stair training;Functional mobility training;Balance training;Therapeutic exercise;Therapeutic activities;Patient/family education;Manual techniques;Passive range of motion;Dry needling;Taping    PT Next Visit Plan   cont with lumbopelvic stab (check alignment), repeat US, STW to L hip or IFC     PT Home Exercise Plan  Transverse abdominis contraction, hooklying ball squeeze, LTR, rt hip isometric flexion PRN. , piriformis , hip abd    Consulted and Agree with Plan of Care  Patient       Patient will benefit from skilled therapeutic intervention in order to improve the following deficits and impairments:  Pain, Abnormal gait, Postural dysfunction, Decreased mobility, Decreased activity tolerance, Decreased range of motion, Decreased strength, Impaired flexibility, Decreased balance, Difficulty walking  Visit Diagnosis: Pain in left hip  Chronic pain of left knee  Muscle weakness (generalized)  Chronic pain of right knee  Difficulty in walking, not elsewhere classified     Problem List Patient Active Problem List   Diagnosis Date Noted  . Essential hypertension 06/25/2015  . Prediabetes 06/25/2015  . Sepsis ([place]) 03/07/2015  . Leukocytosis 03/07/2015    , 08/24/2018, 8:49 AM  [facility] [address] [place], [name], [address] Phone: [phone]   Fax:  [phone]  Name: Michaela Wilson MRN: [id number] Date of Birth: 05-28-1965   Raeford Razor, PT 08/24/18 8:50 AM Phone: [phone] Fax: [phone]

## 2018-08-26 ENCOUNTER — Other Ambulatory Visit: Payer: Self-pay

## 2018-08-26 ENCOUNTER — Ambulatory Visit: Payer: 59 | Admitting: Physical Therapy

## 2018-08-26 DIAGNOSIS — M25552 Pain in left hip: Secondary | ICD-10-CM | POA: Diagnosis not present

## 2018-08-26 DIAGNOSIS — M25561 Pain in right knee: Secondary | ICD-10-CM

## 2018-08-26 DIAGNOSIS — G8929 Other chronic pain: Secondary | ICD-10-CM

## 2018-08-26 DIAGNOSIS — M6281 Muscle weakness (generalized): Secondary | ICD-10-CM

## 2018-08-26 DIAGNOSIS — R262 Difficulty in walking, not elsewhere classified: Secondary | ICD-10-CM

## 2018-08-26 NOTE — Therapy (Signed)
[place], [name], [address] Phone: [phone]   Fax:  [phone]  Physical Therapy Treatment  Patient Details  Name: Michaela Wilson MRN: [id number] Date of Birth: Jan 04, 1966 Referring Provider (PT): Lanae Boast, FNP   Encounter Date: 08/26/2018  PT End of Session - 08/26/18 1012    Visit Number  10    Number of Visits  14    Authorization Type  UHC    PT Start Time  0930    PT Stop Time  1017    PT Time Calculation (min)  47 min    Activity Tolerance  Patient tolerated treatment well    Behavior During Therapy  [facility] for tasks assessed/performed       Past Medical History:  Diagnosis Date  . Anemia   . Cellulitis of right buttock 03/07/2015  . Hypertension   . Seasonal allergies   . Vertigo     Past Surgical History:  Procedure Laterality Date  . ABCESS DRAINAGE    . CHOLECYSTECTOMY    . TUBAL LIGATION      There were no vitals filed for this visit.  Subjective Assessment - 08/26/18 0939    Subjective  Still pretty good.  Hip 4/10.  Knee is still popping.    Currently in Pain?  Yes    Pain Score  4     Pain Location  Hip    Pain Orientation  Left    Pain Descriptors / Indicators  Aching    Pain Type  Chronic pain    Pain Onset  More than a month ago    Pain Frequency  Constant    Aggravating Factors   lay on L side, standing, walking    Pain Relieving Factors  ice , E stim    Multiple Pain Sites  No          [facility] Adult PT Treatment/Exercise - 08/26/18 0001      Knee/Hip Exercises: Stretches   Piriformis Stretch  Left;3 reps   adduction squeeze    Piriformis Stretch Limitations  L ankle over Rt knee as well x 3       Knee/Hip Exercises: Standing   Hip Abduction  Stengthening;Right;Left;1 set;10 reps    Lateral Step Up  Left;1 set;15 reps;Hand Hold: 1;Step Height: 4"    Step Down  Left;1 set;10 reps;Hand Hold: 1;Step Height: 4"      Knee/Hip Exercises: Seated   Long  Arc Quad  Strengthening;Both;1 set;15 reps    Long Arc Quad Weight  4 lbs.    Long [employer] Limitations  2 sets 1 neutral 1 turnout     [employer]  x 10     Sit to [employer]  2 sets;15 reps;without UE support   1 set with ball, 1 set with circle      Knee/Hip Exercises: Supine   Bridges with [employer]  Strengthening;Both;1 set;15 reps    Straight Leg Raises  Strengthening;Both;1 set;15 reps    Straight Leg Raise with External Rotation  Strengthening;Both;1 set;15 reps      Cryotherapy   Number Minutes Cryotherapy  10 Minutes    Cryotherapy Location  Hip    Type of Cryotherapy  Ice pack      Manual Therapy   Manual Therapy  Taping    McConnell  I strip pulling patella medially              PT Education - 08/26/18 1011  Education Details  tape and LE alignment    Person(s) Educated  Patient    Methods  Explanation    Comprehension  Verbalized understanding;Returned demonstration       PT Short Term Goals - 08/12/18 1412      PT SHORT TERM GOAL #1   Title  pt will be independent in her HEP.     Status  Achieved        PT Long Term Goals - 08/24/18 0819      PT LONG TERM GOAL #1   Title  Pt will improve her SLS on the L LE to >/= 20 seconds.     Baseline  Rt 20 sec, Lt. 10 sec (multiple trials)     Status  On-going      PT LONG TERM GOAL #2   Title  pt will improve her L hip strength to >/= 4/5 in order to improve functional mobilty.       PT LONG TERM GOAL #3   Title  Pt will be able to amb a flight of stairs step over step with pain </= 3/10 using single hand rail.     Baseline  better, can demo with controlled pain in AM but not in PM     Status  On-going      PT LONG TERM GOAL #4   Title  pt will be able to walk for 20 minutes with pain </= 3/10.     Baseline  15-20 min pain increased > 5/10    Status  On-going            Plan - 08/26/18 1015    Clinical Impression Statement  Patient with less pain in standing, step ups and down with  McConnell tape.  Continues to improve overall.  Pain limits her housework, cleaning and struggles at the end of the day going up and down stairs at work.    Personal Factors and Comorbidities  Other    Examination-Activity Limitations  Squat;Stairs;Stand;Dressing;Sit    PT Treatment/Interventions  ADLs/Self Care Home Management;Electrical Stimulation;Moist Heat;Cryotherapy;Ultrasound;Gait training;Stair training;Functional mobility training;Balance training;Therapeutic exercise;Therapeutic activities;Patient/family education;Manual techniques;Passive range of motion;Dry needling;Taping    PT Next Visit Plan  how was tape, cont closed chain and modalties as needed    PT Home Exercise Plan  Transverse abdominis contraction, hooklying ball squeeze, LTR, rt hip isometric flexion PRN. , piriformis , hip abd    Consulted and Agree with Plan of Care  Patient       Patient will benefit from skilled therapeutic intervention in order to improve the following deficits and impairments:  Pain, Abnormal gait, Postural dysfunction, Decreased mobility, Decreased activity tolerance, Decreased range of motion, Decreased strength, Impaired flexibility, Decreased balance, Difficulty walking  Visit Diagnosis: Pain in left hip  Chronic pain of left knee  Muscle weakness (generalized)  Chronic pain of right knee  Difficulty in walking, not elsewhere classified     Problem List Patient Active Problem List   Diagnosis Date Noted  . Essential hypertension 06/25/2015  . Prediabetes 06/25/2015  . Sepsis ([place]) 03/07/2015  . Leukocytosis 03/07/2015    [name],[name] 08/26/2018, 10:23 AM  [facility] [address] [place], [name], [address] Phone: [phone]   Fax:  [phone]  Name: AYDIN CAVALIERI MRN: [id number] Date of Birth: [birth year]  Raeford Razor, PT 08/26/18 10:24 AM Phone: [phone] Fax: [phone]

## 2018-08-31 ENCOUNTER — Other Ambulatory Visit: Payer: Self-pay

## 2018-08-31 ENCOUNTER — Ambulatory Visit: Payer: 59 | Admitting: Physical Therapy

## 2018-08-31 ENCOUNTER — Encounter: Payer: Self-pay | Admitting: Physical Therapy

## 2018-08-31 DIAGNOSIS — M25552 Pain in left hip: Secondary | ICD-10-CM | POA: Diagnosis not present

## 2018-08-31 DIAGNOSIS — M25562 Pain in left knee: Secondary | ICD-10-CM

## 2018-08-31 DIAGNOSIS — R262 Difficulty in walking, not elsewhere classified: Secondary | ICD-10-CM

## 2018-08-31 DIAGNOSIS — G8929 Other chronic pain: Secondary | ICD-10-CM

## 2018-08-31 DIAGNOSIS — M25561 Pain in right knee: Secondary | ICD-10-CM

## 2018-08-31 DIAGNOSIS — M6281 Muscle weakness (generalized): Secondary | ICD-10-CM

## 2018-08-31 NOTE — Therapy (Signed)
[place], [name], [address] Phone: [phone]   Fax:  [phone]  Physical Therapy Treatment  Patient Details  Name: Michaela Wilson MRN: [id number] Date of Birth: 1966-02-17 Referring Provider (PT): Lanae Boast, FNP   Encounter Date: 08/31/2018  PT End of Session - 08/31/18 0815    Visit Number  11    Number of Visits  14    PT Start Time  0808    PT Stop Time  0905    PT Time Calculation (min)  57 min    Activity Tolerance  Patient tolerated treatment well    Behavior During Therapy  [facility] for tasks assessed/performed       Past Medical History:  Diagnosis Date  . Anemia   . Cellulitis of right buttock 03/07/2015  . Hypertension   . Seasonal allergies   . Vertigo     Past Surgical History:  Procedure Laterality Date  . ABCESS DRAINAGE    . CHOLECYSTECTOMY    . TUBAL LIGATION      There were no vitals filed for this visit.  Subjective Assessment - 08/31/18 0812    Subjective  I have been in pain all weekend.  Sat I almost went to the ER.  Sunday better.  Then it returned yesterday.  I dont know, I have done nothing different.  Cramping in calves up to thighs both sides.    Currently in Pain?  Yes    Pain Score  7     Pain Location  Hip    Pain Orientation  Left    Pain Descriptors / Indicators  Throbbing    Pain Type  Chronic pain    Pain Onset  More than a month ago    Multiple Pain Sites  Yes    Pain Score  6    Pain Location  Knee    Pain Orientation  Left    Pain Descriptors / Indicators  Throbbing    Pain Type  Chronic pain         [facility] Adult PT Treatment/Exercise - 08/31/18 0001      Lumbar Exercises: Supine   Bridge with Ball Squeeze  15 reps      Knee/Hip Exercises: Stretches   Active Hamstring Stretch  Both;2 reps;30 seconds    ITB Stretch  Both;2 reps;30 seconds      Knee/Hip Exercises: Aerobic   Nustep  L5 UE and LE for 6 min       Knee/Hip Exercises:  Seated   Long Arc Quad  Strengthening;Both;1 set;15 reps    Long Arc Quad Limitations  ball squeeze      Knee/Hip Exercises: Supine   Bridges with Ball Squeeze  --   x 10 with alternating knee ext for medial quad      Moist Heat Therapy   Number Minutes Moist Heat  15 Minutes    Moist Heat Location  Hip;Knee      Electrical Stimulation   Electrical Stimulation Location  L hip and knee lateral     Electrical Stimulation Action  IFC     Electrical Stimulation Parameters  to tol     Electrical Stimulation Goals  Pain      Manual Therapy   Manual Therapy  Taping    McConnell  I strip pulling patella medially              PT Education - 08/31/18 8416    Education Details  Tape technique, stretching and how to get proper response    Person(s) Educated  Patient    Methods  Explanation    Comprehension  Verbalized understanding       PT Short Term Goals - 08/12/18 1412      PT SHORT TERM GOAL #1   Title  pt will be independent in her HEP.     Status  Achieved        PT Long Term Goals - 08/24/18 0819      PT LONG TERM GOAL #1   Title  Pt will improve her SLS on the L LE to >/= 20 seconds.     Baseline  Rt 20 sec, Lt. 10 sec (multiple trials)     Status  On-going      PT LONG TERM GOAL #2   Title  pt will improve her L hip strength to >/= 4/5 in order to improve functional mobilty.       PT LONG TERM GOAL #3   Title  Pt will be able to amb a flight of stairs step over step with pain </= 3/10 using single hand rail.     Baseline  better, can demo with controlled pain in AM but not in PM     Status  On-going      PT LONG TERM GOAL #4   Title  pt will be able to walk for 20 minutes with pain </= 3/10.     Baseline  15-20 min pain increased > 5/10    Status  On-going            Plan - 08/31/18 0859    Clinical Impression Statement  Pt with increased pain and cramping.  Felt stretch for hamstring with strap in L anterior hip, modified to feel  appropriately.  Gave her blank referral for TENS unit, she may try and get one via the [place]. Better post session with [facility] and retape knee.    PT Treatment/Interventions  ADLs/Self Care Home Management;Electrical Stimulation;Moist Heat;Cryotherapy;Ultrasound;Gait training;Stair training;Functional mobility training;Balance training;Therapeutic exercise;Therapeutic activities;Patient/family education;Manual techniques;Passive range of motion;Dry needling;Taping    PT Next Visit Plan  tape, cont closed chain and modalties as needed    PT Home Exercise Plan  Transverse abdominis contraction, hooklying ball squeeze, LTR, rt hip isometric flexion PRN. , piriformis , hip abd, hamstring and ITB    Consulted and Agree with Plan of Care  Patient       Patient will benefit from skilled therapeutic intervention in order to improve the following deficits and impairments:  Pain, Abnormal gait, Postural dysfunction, Decreased mobility, Decreased activity tolerance, Decreased range of motion, Decreased strength, Impaired flexibility, Decreased balance, Difficulty walking  Visit Diagnosis: 1. Pain in left hip   2. Chronic pain of left knee   3. Muscle weakness (generalized)   4. Chronic pain of right knee   5. Difficulty in walking, not elsewhere classified        Problem List Patient Active Problem List   Diagnosis Date Noted  . Essential hypertension 06/25/2015  . Prediabetes 06/25/2015  . Sepsis ([place]) 03/07/2015  . Leukocytosis 03/07/2015    Jamesmichael Shadd 08/31/2018, 9:04 AM  [facility] [address] [place], [name], [address] Phone: [phone]   Fax:  [phone]  Name: CARLOYN LAHUE MRN: [id number] Date of Birth: 1965-08-18

## 2018-09-02 ENCOUNTER — Encounter: Payer: Self-pay | Admitting: Physical Therapy

## 2018-09-02 ENCOUNTER — Other Ambulatory Visit: Payer: Self-pay

## 2018-09-02 ENCOUNTER — Ambulatory Visit: Payer: 59 | Admitting: Physical Therapy

## 2018-09-02 DIAGNOSIS — G8929 Other chronic pain: Secondary | ICD-10-CM

## 2018-09-02 DIAGNOSIS — M25552 Pain in left hip: Secondary | ICD-10-CM | POA: Diagnosis not present

## 2018-09-02 DIAGNOSIS — R262 Difficulty in walking, not elsewhere classified: Secondary | ICD-10-CM

## 2018-09-02 DIAGNOSIS — M25562 Pain in left knee: Secondary | ICD-10-CM

## 2018-09-02 DIAGNOSIS — M6281 Muscle weakness (generalized): Secondary | ICD-10-CM

## 2018-09-02 NOTE — Therapy (Signed)
[place], [name], [address] Phone: [phone]   Fax:  [phone]  Physical Therapy Treatment/Renewal   Patient Details  Name: Michaela Wilson MRN: [id number] Date of Birth: [birth year] Referring Provider (PT): Lanae Boast, FNP   Encounter Date: 09/02/2018  PT End of Session - 09/02/18 0838    Visit Number  12    Number of Visits  --    Date for PT Re-Evaluation  10/14/18    Authorization Type  UHC    PT Start Time  0832    PT Stop Time  0915    PT Time Calculation (min)  43 min    Activity Tolerance  Patient tolerated treatment well    Behavior During Therapy  [facility] for tasks assessed/performed       Past Medical History:  Diagnosis Date  . Anemia   . Cellulitis of right buttock 03/07/2015  . Hypertension   . Seasonal allergies   . Vertigo     Past Surgical History:  Procedure Laterality Date  . ABCESS DRAINAGE    . CHOLECYSTECTOMY    . TUBAL LIGATION      There were no vitals filed for this visit.  Subjective Assessment - 09/02/18 0836    Subjective  Im about a 4/10.  When I take the tape off everything just falls outward.    Currently in Pain?  Yes    Pain Score  4     Pain Location  Hip    Pain Orientation  Left    Pain Descriptors / Indicators  Aching    Pain Type  Chronic pain    Pain Onset  More than a month ago    Pain Frequency  Intermittent    Aggravating Factors   lay on L side, overactivity    Pain Relieving Factors  ice, E stim         [facility] PT Assessment - 09/02/18 0001      Assessment   Next MD Visit         [facility] Adult PT Treatment/Exercise - 09/02/18 0001      Self-Care   Other Self-Care Comments   tape, squat form, VMO      Knee/Hip Exercises: Machines for Strengthening   Cybex Knee Extension  15 lbs x 10  x 2 , 2nd set with toes turned out (10 lbs)       Knee/Hip Exercises: Standing   Functional Squat  10 reps      Knee/Hip Exercises: Supine   Bridges with Ball Squeeze  Strengthening;Both;1 set;20 reps    Single Leg Bridge  Strengthening;Both;1 set;10 reps    Straight Leg Raise with External Rotation  Strengthening;Both;1 set;20 reps    Straight Leg Raise with External Rotation Limitations  added hip abd for 2nd set       Manual Therapy   Manual Therapy  Taping    McConnell  I strip pulling patella medially    pt done with cues , included in self care         PT Short Term Goals - 09/02/18 0902      PT SHORT TERM GOAL #1   Title  pt will be independent in her HEP.     Status  Achieved        PT Long Term Goals - 09/02/18 0903      PT LONG TERM GOAL #1   Title  Pt will improve her SLS on the  L LE to >/= 20 seconds.     Baseline  Rt 20 sec, Lt. 10 sec (multiple trials)     Status  On-going      PT LONG TERM GOAL #2   Title  pt will improve her L hip strength to >/= 4/5 in order to improve functional mobilty.     Status  On-going      PT LONG TERM GOAL #3   Title  Pt will be able to amb a flight of stairs step over step with pain </= 3/10 using single hand rail.     Baseline  better, can demo with controlled pain in AM but not in PM     Status  On-going      PT LONG TERM GOAL #4   Title  pt will be able to walk for 20 minutes with pain </= 3/10.     Baseline  15-20 min pain increased > 5/10    Status  On-going            Plan - 09/02/18 0912    Clinical Impression Statement  Taught patient how to tape her knee and gave her link for home purchase. Reviewed squat form, able to do without difficulty.  Focused on VMO strength today, renewed for 6 more weeks of PT.  Benefitting greatly from PT.    Personal Factors and Comorbidities  Other    Examination-Activity Limitations  Squat;Stairs;Stand;Dressing;Sit    Examination-Participation Restrictions  Laundry;Other    Stability/Clinical Decision Making  Evolving/Moderate complexity    Clinical Decision Making  Moderate    Rehab Potential  Good    PT  Frequency  2x / week   1-2 times   PT Duration  8 weeks    PT Treatment/Interventions  ADLs/Self Care Home Management;Electrical Stimulation;Moist Heat;Cryotherapy;Ultrasound;Gait training;Stair training;Functional mobility training;Balance training;Therapeutic exercise;Therapeutic activities;Patient/family education;Manual techniques;Passive range of motion;Dry needling;Taping;Iontophoresis 4mg /ml Dexamethasone    PT Next Visit Plan  tape, cont closed chain and modalties as needed    PT Home Exercise Plan  Transverse abdominis contraction, hooklying ball squeeze, LTR, rt hip isometric flexion PRN. , piriformis , hip abd, hamstring and ITB, SLR/VMO    Consulted and Agree with Plan of Care  Patient       Patient will benefit from skilled therapeutic intervention in order to improve the following deficits and impairments:  Pain, Abnormal gait, Postural dysfunction, Decreased mobility, Decreased activity tolerance, Decreased range of motion, Decreased strength, Impaired flexibility, Decreased balance, Difficulty walking  Visit Diagnosis: 1. Pain in left hip   2. Chronic pain of left knee   3. Muscle weakness (generalized)   4. Chronic pain of right knee   5. Difficulty in walking, not elsewhere classified        Problem List Patient Active Problem List   Diagnosis Date Noted  . Essential hypertension 06/25/2015  . Prediabetes 06/25/2015  . Sepsis ([place]) 03/07/2015  . Leukocytosis 03/07/2015    [name],[name] 09/02/2018, 9:31 AM  [facility] [address] [place], [name], [address] Phone: [phone]   Fax:  [phone]  Name: TIPHANY FAYSON MRN: [id number] Date of Birth: 1965-06-09  Raeford Razor, PT 09/02/18 9:31 AM Phone: [phone] Fax: [phone]

## 2018-09-07 ENCOUNTER — Encounter: Payer: Self-pay | Admitting: Physical Therapy

## 2018-09-07 ENCOUNTER — Ambulatory Visit: Payer: 59 | Admitting: Physical Therapy

## 2018-09-07 ENCOUNTER — Other Ambulatory Visit: Payer: Self-pay

## 2018-09-07 DIAGNOSIS — M25561 Pain in right knee: Secondary | ICD-10-CM

## 2018-09-07 DIAGNOSIS — M25562 Pain in left knee: Secondary | ICD-10-CM

## 2018-09-07 DIAGNOSIS — G8929 Other chronic pain: Secondary | ICD-10-CM

## 2018-09-07 DIAGNOSIS — M25552 Pain in left hip: Secondary | ICD-10-CM | POA: Diagnosis not present

## 2018-09-07 DIAGNOSIS — M6281 Muscle weakness (generalized): Secondary | ICD-10-CM

## 2018-09-07 DIAGNOSIS — R262 Difficulty in walking, not elsewhere classified: Secondary | ICD-10-CM
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
## 2018-09-08 ENCOUNTER — Ambulatory Visit: Payer: 59 | Admitting: Physical Therapy

## 2018-09-08 ENCOUNTER — Encounter: Payer: Self-pay | Admitting: Physical Therapy

## 2018-09-08 DIAGNOSIS — G8929 Other chronic pain: Secondary | ICD-10-CM

## 2018-09-08 DIAGNOSIS — R262 Difficulty in walking, not elsewhere classified: Secondary | ICD-10-CM

## 2018-09-08 DIAGNOSIS — M25552 Pain in left hip: Secondary | ICD-10-CM | POA: Diagnosis not present

## 2018-09-08 DIAGNOSIS — M25561 Pain in right knee: Secondary | ICD-10-CM

## 2018-09-08 DIAGNOSIS — M6281 Muscle weakness (generalized): Secondary | ICD-10-CM

## 2018-09-08 NOTE — Therapy (Signed)
[place], [name], [address] Phone: [phone]   Fax:  [phone]  Physical Therapy Treatment  Patient Details  Name: Michaela Wilson MRN: [id number] Date of Birth: Dec 10, 1965 Referring Provider (PT): Lanae Boast, FNP   Encounter Date: 09/08/2018  PT End of Session - 09/08/18 0818    Visit Number  14    Date for PT Re-Evaluation  10/14/18    Authorization Type  UHC    PT Start Time  0807    PT Stop Time  0845    PT Time Calculation (min)  38 min    Activity Tolerance  Patient tolerated treatment well    Behavior During Therapy  [facility] for tasks assessed/performed       Past Medical History:  Diagnosis Date  . Anemia   . Cellulitis of right buttock 03/07/2015  . Hypertension   . Seasonal allergies   . Vertigo     Past Surgical History:  Procedure Laterality Date  . ABCESS DRAINAGE    . CHOLECYSTECTOMY    . TUBAL LIGATION      There were no vitals filed for this visit.  Subjective Assessment - 09/08/18 0815    Subjective  I feel the tape working.  Less pain.       [place] Adult PT Treatment/Exercise - 09/08/18 0001      Pilates   Pilates Reformer  see note      Pilates Reformer used for LE/core strength, postural strength, lumbopelvic disassociation and core control.   Exercises included:  Footwork 2 Red 1 blue parallel on heels then toes.  Heel lift x 20, prancing  Wide knees on toes, then heel lift x 10   Bridging 2 Red 1 blue articulating with ball, cues for lumbar spine x 8  Feet in Straps 1 Red 1 yellow Arcs, Squats x 10      PT Education - 09/08/18 0816    Education Details  Pilates, alignment    Person(s) Educated  Patient    Methods  Explanation;Verbal cues    Comprehension  Verbalized understanding;Returned demonstration       PT Short Term Goals - 09/02/18 0902      PT SHORT TERM GOAL #1   Title  pt will be independent in her HEP.     Status  Achieved         PT Long Term Goals - 09/02/18 0903      PT LONG TERM GOAL #1   Title  Pt will improve her SLS on the L LE to >/= 20 seconds.     Baseline  Rt 20 sec, Lt. 10 sec (multiple trials)     Status  On-going      PT LONG TERM GOAL #2   Title  pt will improve her L hip strength to >/= 4/5 in order to improve functional mobilty.     Status  On-going      PT LONG TERM GOAL #3   Title  Pt will be able to amb a flight of stairs step over step with pain </= 3/10 using single hand rail.     Baseline  better, can demo with controlled pain in AM but not in PM     Status  On-going      PT LONG TERM GOAL #4   Title  pt will be able to walk for 20 minutes with pain </= 3/10.     Baseline  15-20 min pain  increased > 5/10    Status  On-going            Plan - 09/08/18 0929    Clinical Impression Statement  Patient used the Pilates Reformer and reported no increased pain.  Able to demonstrate good body awarness but needed cues for stabilizing core and pelvis.    PT Treatment/Interventions  ADLs/Self Care Home Management;Electrical Stimulation;Moist Heat;Cryotherapy;Ultrasound;Gait training;Stair training;Functional mobility training;Balance training;Therapeutic exercise;Therapeutic activities;Patient/family education;Manual techniques;Passive range of motion;Dry needling;Taping;Iontophoresis 4mg /ml Dexamethasone    PT Next Visit Plan  tape, cont closed chain and modalties as needed, Pilates    PT Home Exercise Plan  Transverse abdominis contraction, hooklying ball squeeze, LTR, rt hip isometric flexion PRN. , piriformis , hip abd, hamstring and ITB, SLR/VMO    Consulted and Agree with Plan of Care  Patient       Patient will benefit from skilled therapeutic intervention in order to improve the following deficits and impairments:  Pain, Abnormal gait, Postural dysfunction, Decreased mobility, Decreased activity tolerance, Decreased range of motion, Decreased strength, Impaired flexibility,  Decreased balance, Difficulty walking  Visit Diagnosis: 1. Pain in left hip   2. Chronic pain of left knee   3. Muscle weakness (generalized)   4. Chronic pain of right knee   5. Difficulty in walking, not elsewhere classified        Problem List Patient Active Problem List   Diagnosis Date Noted  . Essential hypertension 06/25/2015  . Prediabetes 06/25/2015  . Sepsis ([place]) 03/07/2015  . Leukocytosis 03/07/2015    Taisia Fantini 09/08/2018, 9:00 AM  [facility] [address] [place], [name], [address] Phone: [phone]   Fax:  [phone]  Name: Michaela Wilson MRN: [id number] Date of Birth: 1965-08-19  Raeford Razor, PT 09/08/18 9:00 AM Phone: [phone] Fax: [phone]

## 2018-09-09 ENCOUNTER — Other Ambulatory Visit (HOSPITAL_COMMUNITY)
Admission: RE | Admit: 2018-09-09 | Discharge: 2018-09-09 | Disposition: A | Discharge status: Home / Self Care | Payer: 59 | Source: Ambulatory Visit | Attending: Internal Medicine | Admitting: Internal Medicine

## 2018-09-09 DIAGNOSIS — Z1159 Encounter for screening for other viral diseases: Secondary | ICD-10-CM | POA: Diagnosis present

## 2018-09-09 LAB — SARS CORONAVIRUS 2 (TAT 6-24 HRS): SARS Coronavirus 2: NEGATIVE

## 2018-09-12 ENCOUNTER — Ambulatory Visit (HOSPITAL_BASED_OUTPATIENT_CLINIC_OR_DEPARTMENT_OTHER): Payer: 59 | Attending: Family Medicine | Admitting: Internal Medicine

## 2018-09-12 ENCOUNTER — Other Ambulatory Visit: Payer: Self-pay

## 2018-09-12 DIAGNOSIS — R0681 Apnea, not elsewhere classified: Secondary | ICD-10-CM | POA: Diagnosis not present

## 2018-09-12 DIAGNOSIS — G478 Other sleep disorders: Secondary | ICD-10-CM | POA: Diagnosis present

## 2018-09-15 ENCOUNTER — Other Ambulatory Visit: Payer: Self-pay

## 2018-09-15 ENCOUNTER — Encounter: Payer: Self-pay | Admitting: Physical Therapy

## 2018-09-15 ENCOUNTER — Ambulatory Visit: Payer: 59 | Attending: Family Medicine | Admitting: Physical Therapy

## 2018-09-15 DIAGNOSIS — M25552 Pain in left hip: Secondary | ICD-10-CM

## 2018-09-15 DIAGNOSIS — R262 Difficulty in walking, not elsewhere classified: Secondary | ICD-10-CM

## 2018-09-15 DIAGNOSIS — G8929 Other chronic pain: Secondary | ICD-10-CM

## 2018-09-15 DIAGNOSIS — M25562 Pain in left knee: Secondary | ICD-10-CM | POA: Diagnosis present

## 2018-09-15 DIAGNOSIS — M25561 Pain in right knee: Secondary | ICD-10-CM | POA: Insufficient documentation

## 2018-09-15 DIAGNOSIS — M6281 Muscle weakness (generalized): Secondary | ICD-10-CM | POA: Diagnosis present

## 2018-09-16 NOTE — Therapy (Signed)
[place], [name], [address] Phone: [phone]   Fax:  [phone]  Physical Therapy Treatment  Patient Details  Name: Michaela Wilson MRN: [id number] Date of Birth: September 19, 1965 Referring Provider (PT): Lanae Boast, FNP   Encounter Date: 09/15/2018  PT End of Session - 09/15/18 1538    Visit Number  15    Date for PT Re-Evaluation  10/14/18    PT Start Time  1505    PT Stop Time  1545    PT Time Calculation (min)  40 min    Activity Tolerance  Patient tolerated treatment well    Behavior During Therapy  [facility] for tasks assessed/performed       Past Medical History:  Diagnosis Date  . Anemia   . Cellulitis of right buttock 03/07/2015  . Hypertension   . Seasonal allergies   . Vertigo     Past Surgical History:  Procedure Laterality Date  . ABCESS DRAINAGE    . CHOLECYSTECTOMY    . TUBAL LIGATION      There were no vitals filed for this visit.  Subjective Assessment - 09/15/18 1456    Subjective  It s bad day.  Typical for time of day.  Was hurting alot Sunday PM as she had a sleep study.  Mainly L knee    Currently in Pain?  Yes    Pain Score  7     Pain Location  Knee    Pain Orientation  Left    Pain Descriptors / Indicators  Aching    Pain Type  Chronic pain    Pain Onset  More than a month ago    Pain Frequency  Intermittent              [facility] Adult PT Treatment/Exercise - 09/16/18 0001      Knee/Hip Exercises: Stretches   Active Hamstring Stretch  Left;3 reps    Arboriculturist Limitations  strap with leg off table     Hip Flexor Stretch  Both;3 reps    Hip Flexor Stretch Limitations  Thomas Test neg     ITB Stretch  Left;3 reps    ITB Stretch Limitations  standing     Other Knee/Hip Stretches  wide knees with rotation      Cryotherapy   Number Minutes Cryotherapy  15 Minutes    Cryotherapy Location  Hip    Type of Cryotherapy  Ice pack       Electrical Stimulation   Electrical Stimulation Location  L hip and knee lateral     Electrical Stimulation Action  IFC    Electrical Stimulation Parameters  to tol     Electrical Stimulation Goals  Pain      Manual Therapy   Soft tissue mobilization  IASTM L thigh, lateral hip     McConnell  I strip pulling patella medially    pt done with cues , included in self care             PT Education - 09/15/18 1537    Education Details  standing ITB stretch, consider brace?    Person(s) Educated  Patient    Methods  Explanation    Comprehension  Verbalized understanding       PT Short Term Goals - 09/02/18 0902      PT SHORT TERM GOAL #1   Title  pt will be independent in her  HEP.     Status  Achieved        PT Long Term Goals - 09/15/18 1540      PT LONG TERM GOAL #1   Title  Pt will improve her SLS on the L LE to >/= 20 seconds.     Status  On-going      PT LONG TERM GOAL #2   Title  pt will improve her L hip strength to >/= 4/5 in order to improve functional mobilty.     Status  On-going      PT LONG TERM GOAL #3   Title  Pt will be able to amb a flight of stairs step over step with pain </= 3/10 using single hand rail.     Status  On-going      PT LONG TERM GOAL #4   Title  pt will be able to walk for 20 minutes with pain </= 3/10.     Status  On-going            Plan - 09/15/18 1554    Clinical Impression Statement  Increased pain today.  I usually see her in AM but she worked all day.  very sensitive in her L thigh (vastus lateralis and ITB) spanning hip to knee.  Small trigger points very sensitive to IASTM.  Mentioned brace but prefer to no use it.  Tape helps her quite a bit.  Addressed tightness into internal rotation today.    PT Treatment/Interventions  ADLs/Self Care Home Management;Electrical Stimulation;Moist Heat;Cryotherapy;Ultrasound;Gait training;Stair training;Functional mobility training;Balance training;Therapeutic  exercise;Therapeutic activities;Patient/family education;Manual techniques;Passive range of motion;Dry needling;Taping;Iontophoresis 4mg /ml Dexamethasone    PT Next Visit Plan  tape, cont closed chain and modalties as needed, Pilates    PT Home Exercise Plan  Transverse abdominis contraction, hooklying ball squeeze, LTR, rt hip isometric flexion PRN. , piriformis , hip abd, hamstring and ITB, SLR/VMO    Consulted and Agree with Plan of Care  Patient       Patient will benefit from skilled therapeutic intervention in order to improve the following deficits and impairments:  Pain, Abnormal gait, Postural dysfunction, Decreased mobility, Decreased activity tolerance, Decreased range of motion, Decreased strength, Impaired flexibility, Decreased balance, Difficulty walking  Visit Diagnosis: 1. Pain in left hip   2. Chronic pain of left knee   3. Muscle weakness (generalized)   4. Chronic pain of right knee   5. Difficulty in walking, not elsewhere classified        Problem List Patient Active Problem List   Diagnosis Date Noted  . Essential hypertension 06/25/2015  . Prediabetes 06/25/2015  . Sepsis ([place]) 03/07/2015  . Leukocytosis 03/07/2015    [name],[name] 09/16/2018, 5:42 AM  [facility] [address] [place], [name], [address] Phone: [phone]   Fax:  [phone]  Name: Michaela Wilson MRN: [id number] Date of Birth: 1965/05/14  Raeford Razor, PT 09/16/18 5:42 AM Phone: [phone] Fax: [phone]

## 2018-09-17 ENCOUNTER — Encounter

## 2018-09-20 ENCOUNTER — Ambulatory Visit: Payer: 59 | Admitting: Physical Therapy

## 2018-09-21 ENCOUNTER — Encounter: Payer: Self-pay | Admitting: Physical Therapy

## 2018-09-21 ENCOUNTER — Ambulatory Visit: Payer: 59 | Admitting: Physical Therapy

## 2018-09-21 ENCOUNTER — Other Ambulatory Visit: Payer: Self-pay

## 2018-09-21 DIAGNOSIS — M25562 Pain in left knee: Secondary | ICD-10-CM

## 2018-09-21 DIAGNOSIS — R262 Difficulty in walking, not elsewhere classified: Secondary | ICD-10-CM

## 2018-09-21 DIAGNOSIS — G8929 Other chronic pain: Secondary | ICD-10-CM

## 2018-09-21 DIAGNOSIS — M25552 Pain in left hip: Secondary | ICD-10-CM | POA: Diagnosis not present

## 2018-09-21 DIAGNOSIS — M6281 Muscle weakness (generalized): Secondary | ICD-10-CM
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
## 2018-09-22 DIAGNOSIS — R0681 Apnea, not elsewhere classified: Secondary | ICD-10-CM | POA: Diagnosis not present

## 2018-09-22 NOTE — Procedures (Signed)
   Patient Name: Michaela Wilson, Michaela Wilson Date: 09/12/2018 Gender: Female D.O.B: January 14, 1966 Age (years): 28 Referring Provider: Lanae Boast FNP Height (inches): 49 Interpreting Physician: Baird Lyons MD, ABSM Weight (lbs): 150 RPSGT: Earney [place] BMI: 27 MRN: [id number] Neck Size: 14.00  CLINICAL INFORMATION Sleep Study Type: NPSG Indication for sleep study: OSA Epworth Sleepiness Score: 12  SLEEP STUDY TECHNIQUE As per the AASM Manual for the Scoring of Sleep and Associated Events v2.3 (April 2016) with a hypopnea requiring 4% desaturations.  The channels recorded and monitored were frontal, central and occipital EEG, electrooculogram (EOG), submentalis EMG (chin), nasal and oral airflow, thoracic and abdominal wall motion, anterior tibialis EMG, snore microphone, electrocardiogram, and pulse oximetry.  MEDICATIONS Medications self-administered by patient taken the night of the study : none reported  SLEEP ARCHITECTURE The study was initiated at 9:54:08 PM and ended at 4:35:12 AM.  Sleep onset time was 16.9 minutes and the sleep efficiency was 51.3%%. The total sleep time was 205.7 minutes.  Stage REM latency was 128.0 minutes.  The patient spent 6.3%% of the night in stage N1 sleep, 73.3%% in stage N2 sleep, 0.0%% in stage N3 and 20.4% in REM.  Alpha intrusion was absent.  Supine sleep was 25.36%.  RESPIRATORY PARAMETERS The overall apnea/hypopnea index (AHI) was 0.0 per hour. There were 0 total apneas, including 0 obstructive, 0 central and 0 mixed apneas. There were 0 hypopneas and 0 RERAs.  The AHI during Stage REM sleep was 0.0 per hour.  AHI while supine was 0.0 per hour.  The mean oxygen saturation was 97.1%. The minimum SpO2 during sleep was 94.0%.  soft snoring was noted during this study.  CARDIAC DATA The 2 lead EKG demonstrated sinus rhythm. The mean heart rate was 73.3 beats per minute. Other EKG findings include: None.  LEG MOVEMENT DATA The  total PLMS were 0 with a resulting PLMS index of 0.0. Associated arousal with leg movement index was 0.0 .  IMPRESSIONS - No significant obstructive sleep apnea occurred during this study (AHI = 0.0/h). - No significant central sleep apnea occurred during this study (CAI = 0.0/h). - The patient had minimal or no oxygen desaturation during the study (Min O2 = 94.0%) - The patient snored with soft snoring volume. - No cardiac abnormalities were noted during this study. - Clinically significant periodic limb movements did not occur during sleep. No significant associated arousals.  DIAGNOSIS - Primary snoring  RECOMMENDATIONS - Manage for snoring and symptoms as clinically indicated. - Be careful with alcohol, sedatives and other CNS depressants that may worsen sleep apnea and disrupt normal sleep architecture. - Sleep hygiene should be reviewed to assess factors that may improve sleep quality. - Weight management and regular exercise should be initiated or continued if appropriate.  [Electronically signed] 09/22/2018 03:23 PM  Baird Lyons MD, [place], American Board of Sleep Medicine   NPI: [id number]                          [place], [place] [facility]  ELECTRONICALLY SIGNED ON:  09/22/2018, 3:21 PM [place] PH: [phone]   FX: [phone] [place]

## 2018-09-23 ENCOUNTER — Encounter: Payer: Self-pay | Admitting: Physical Therapy

## 2018-09-23 ENCOUNTER — Other Ambulatory Visit: Payer: Self-pay

## 2018-09-23 ENCOUNTER — Ambulatory Visit: Payer: 59 | Admitting: Physical Therapy

## 2018-09-23 DIAGNOSIS — M25562 Pain in left knee: Secondary | ICD-10-CM

## 2018-09-23 DIAGNOSIS — R262 Difficulty in walking, not elsewhere classified: Secondary | ICD-10-CM

## 2018-09-23 DIAGNOSIS — G8929 Other chronic pain: Secondary | ICD-10-CM

## 2018-09-23 DIAGNOSIS — M25552 Pain in left hip: Secondary | ICD-10-CM | POA: Diagnosis not present

## 2018-09-23 DIAGNOSIS — M6281 Muscle weakness (generalized): Secondary | ICD-10-CM

## 2018-09-23 NOTE — Telephone Encounter (Signed)
Please call the patient with her results. I sent them through mychart and she is requesting further explanation and a copy of the study.

## 2018-09-23 NOTE — Therapy (Signed)
[place], [name], [address] Phone: [phone]   Fax:  [phone]  Physical Therapy Treatment  Patient Details  Name: Michaela Wilson MRN: [id number] Date of Birth: 1965/12/05 Referring Provider (PT): Lanae Boast, FNP   Encounter Date: 09/23/2018  PT End of Session - 09/23/18 0942    Visit Number  17    Date for PT Re-Evaluation  10/14/18    Authorization Type  [employer]    PT Start Time  0930    PT Stop Time  1028    PT Time Calculation (min)  58 min    Activity Tolerance  Patient tolerated treatment well    Behavior During Therapy  [facility] for tasks assessed/performed       Past Medical History:  Diagnosis Date  . Anemia   . Cellulitis of right buttock 03/07/2015  . Hypertension   . Seasonal allergies   . Vertigo     Past Surgical History:  Procedure Laterality Date  . ABCESS DRAINAGE    . CHOLECYSTECTOMY    . TUBAL LIGATION      There were no vitals filed for this visit.  Subjective Assessment - 09/23/18 0938    Subjective  Today is a good day.  Hip 5/10.Marland Kitchen Knee 5/10.  I think Im developing a bit of a rash today.    Currently in Pain?  Yes    Pain Score  5     Pain Location  Hip   and knee   Pain Orientation  Left    Pain Descriptors / Indicators  Aching    Pain Type  Chronic pain    Pain Onset  More than a month ago    Pain Frequency  Intermittent    Aggravating Factors   stairs    Pain Relieving Factors  ice, IFC, tape         [facility] Adult PT Treatment/Exercise - 09/23/18 0001      Lumbar Exercises: Standing   Wall Slides  10 reps    Wall Slides Limitations  2 sets , 1 neutral, 1 V     Other Standing Lumbar Exercises  single leg hip hinge with light UE asssit x 10 each side       Knee/Hip Exercises: Stretches   ITB Stretch  Left;3 reps    Piriformis Stretch  Left;3 reps      Knee/Hip Exercises: Aerobic   Recumbent Bike  6 min L2 gradual elevation increase      Knee/Hip  Exercises: Standing   SLS  on foam    SLS with Vectors  hip abd, semi circle, very light to no UE assist     Other Standing Knee Exercises  SLS with trunk rotation and overhead lift x 5  each leg      Knee/Hip Exercises: Supine   Straight Leg Raise with External Rotation  Strengthening;Left;3 sets      Cryotherapy   Number Minutes Cryotherapy  10 Minutes    Cryotherapy Location  Hip    Type of Cryotherapy  Ice pack      Manual Therapy   Soft tissue mobilization  IASTM L thigh, glutes, lateral hip              PT Education - 09/23/18 0941    Education Details  VMO, Reformer    Person(s) Educated  Patient    Methods  Explanation    Comprehension  Verbalized understanding  PT Short Term Goals - 09/02/18 0902      PT SHORT TERM GOAL #1   Title  pt will be independent in her HEP.     Status  Achieved        PT Long Term Goals - 09/15/18 1540      PT LONG TERM GOAL #1   Title  Pt will improve her SLS on the L LE to >/= 20 seconds.     Status  On-going      PT LONG TERM GOAL #2   Title  pt will improve her L hip strength to >/= 4/5 in order to improve functional mobilty.     Status  On-going      PT LONG TERM GOAL #3   Title  Pt will be able to amb a flight of stairs step over step with pain </= 3/10 using single hand rail.     Status  On-going      PT LONG TERM GOAL #4   Title  pt will be able to walk for 20 minutes with pain </= 3/10.     Status  On-going            Plan - 09/23/18 1120    Clinical Impression Statement  Pt was pleased she could stabilize in singel leg stance as she performed UE movements.  This has been something she has struggled with for a long time.  Tolerates exercise well.  Interested in dry needling for further pain relief.    PT Treatment/Interventions  ADLs/Self Care Home Management;Electrical Stimulation;Moist Heat;Cryotherapy;Ultrasound;Gait training;Stair training;Functional mobility training;Balance training;Therapeutic  exercise;Therapeutic activities;Patient/family education;Manual techniques;Passive range of motion;Dry needling;Taping;Iontophoresis 4mg /ml Dexamethasone    PT Next Visit Plan  tape, cont closed chain and modalties as needed, Pilates    PT Home Exercise Plan  Transverse abdominis contraction, hooklying ball squeeze, LTR, rt hip isometric flexion PRN. , piriformis , hip abd, hamstring and ITB, SLR/VMO    Consulted and Agree with Plan of Care  Patient       Patient will benefit from skilled therapeutic intervention in order to improve the following deficits and impairments:  Pain, Abnormal gait, Postural dysfunction, Decreased mobility, Decreased activity tolerance, Decreased range of motion, Decreased strength, Impaired flexibility, Decreased balance, Difficulty walking  Visit Diagnosis: 1. Pain in left hip   2. Chronic pain of left knee   3. Muscle weakness (generalized)   4. Chronic pain of right knee   5. Difficulty in walking, not elsewhere classified        Problem List Patient Active Problem List   Diagnosis Date Noted  . Essential hypertension 06/25/2015  . Prediabetes 06/25/2015  . Sepsis ([place]) 03/07/2015  . Leukocytosis 03/07/2015    Michaela Wilson 09/23/2018, 12:26 PM  [facility] [address] [place], [name], [address] Phone: [phone]   Fax:  [phone]  Name: Michaela Wilson MRN: [id number] Date of Birth: [birth year]  Raeford Razor, PT 09/23/18 12:26 PM Phone: [phone] Fax: [phone]

## 2018-09-28 ENCOUNTER — Ambulatory Visit: Payer: 59 | Admitting: Physical Therapy

## 2018-09-28 ENCOUNTER — Other Ambulatory Visit: Payer: Self-pay

## 2018-09-28 ENCOUNTER — Encounter: Payer: Self-pay | Admitting: Physical Therapy

## 2018-09-28 DIAGNOSIS — R262 Difficulty in walking, not elsewhere classified: Secondary | ICD-10-CM

## 2018-09-28 DIAGNOSIS — G8929 Other chronic pain: Secondary | ICD-10-CM

## 2018-09-28 DIAGNOSIS — M6281 Muscle weakness (generalized): Secondary | ICD-10-CM

## 2018-09-28 DIAGNOSIS — M25552 Pain in left hip: Secondary | ICD-10-CM

## 2018-09-28 NOTE — Therapy (Signed)
[place], [name], [address] Phone: [phone]   Fax:  [phone]  Physical Therapy Treatment  Patient Details  Name: Michaela Wilson MRN: [id number] Date of Birth: October 03, 1965 Referring Provider (PT): Lanae Boast, FNP   Encounter Date: 09/28/2018  PT End of Session - 09/28/18 0938    Visit Number  18    Date for PT Re-Evaluation  10/14/18    PT Start Time  0933    PT Stop Time  1015    PT Time Calculation (min)  42 min    Activity Tolerance  Patient tolerated treatment well    Behavior During Therapy  [facility] for tasks assessed/performed       Past Medical History:  Diagnosis Date  . Anemia   . Cellulitis of right buttock 03/07/2015  . Hypertension   . Seasonal allergies   . Vertigo     Past Surgical History:  Procedure Laterality Date  . ABCESS DRAINAGE    . CHOLECYSTECTOMY    . TUBAL LIGATION      There were no vitals filed for this visit.  Subjective Assessment - 09/28/18 0935    Subjective  Didn't have too much pain over the weekend.  Pain in L hip is nothing I cant deal with.  Packing boxes as she is moving.  Had a flare up on Rt hip though.          [facility] Adult PT Treatment/Exercise - 09/28/18 0001      Pilates   Pilates Reformer  see note       Knee/Hip Exercises: Aerobic   Elliptical  5 min L 10 ramp and L 1 resistance         Pilates Reformer used for LE/core strength, postural strength, lumbopelvic disassociation and core control.  Exercises included:  Scooter 1 red : x 10 add lunge x 10, followed by anterior hip stretching with posterior pelvic tilt   Footwork 2 Red 1 blue parallel heels, V on heels, parallel toes and V on toes all for about 10--15 reps.  Min cues needed for knee of knees, pelvis  Single leg 2 red x 10 each opposite leg held in knee extension  Bridging 2 Red 1 blue x 10 added clam x 10 for stability challenge     PT Short Term Goals - 09/02/18  0902      PT SHORT TERM GOAL #1   Title  pt will be independent in her HEP.     Status  Achieved        PT Long Term Goals - 09/15/18 1540      PT LONG TERM GOAL #1   Title  Pt will improve her SLS on the L LE to >/= 20 seconds.     Status  On-going      PT LONG TERM GOAL #2   Title  pt will improve her L hip strength to >/= 4/5 in order to improve functional mobilty.     Status  On-going      PT LONG TERM GOAL #3   Title  Pt will be able to amb a flight of stairs step over step with pain </= 3/10 using single hand rail.     Status  On-going      PT LONG TERM GOAL #4   Title  pt will be able to walk for 20 minutes with pain </= 3/10.     Status  On-going  Plan - 09/28/18 0941    Clinical Impression Statement  Pt did well today with all exercises today using the Reformer.  Only needed min cues to maintain pelvic neutral and control with bridging/articulation. Patient has had the "best week since beginning PT" .    PT Treatment/Interventions  ADLs/Self Care Home Management;Electrical Stimulation;Moist Heat;Cryotherapy;Ultrasound;Gait training;Stair training;Functional mobility training;Balance training;Therapeutic exercise;Therapeutic activities;Patient/family education;Manual techniques;Passive range of motion;Dry needling;Taping;Iontophoresis 4mg /ml Dexamethasone    PT Next Visit Plan  tape, cont closed chain and modalties as needed, Pilates    PT Home Exercise Plan  Transverse abdominis contraction, hooklying ball squeeze, LTR, rt hip isometric flexion PRN. , piriformis , hip abd, hamstring and ITB, SLR/VMO    Consulted and Agree with Plan of Care  Patient       Patient will benefit from skilled therapeutic intervention in order to improve the following deficits and impairments:  Pain, Abnormal gait, Postural dysfunction, Decreased mobility, Decreased activity tolerance, Decreased range of motion, Decreased strength, Impaired flexibility, Decreased balance,  Difficulty walking  Visit Diagnosis: 1. Pain in left hip   2. Chronic pain of left knee   3. Muscle weakness (generalized)   4. Chronic pain of right knee   5. Difficulty in walking, not elsewhere classified        Problem List Patient Active Problem List   Diagnosis Date Noted  . Essential hypertension 06/25/2015  . Prediabetes 06/25/2015  . Sepsis ([place]) 03/07/2015  . Leukocytosis 03/07/2015    Camya Haydon 09/28/2018, 10:29 AM  [facility] [address] [place], [name], [address] Phone: [phone]   Fax:  [phone]  Name: Michaela Wilson MRN: [id number] Date of Birth: 10-31-1965  Raeford Razor, PT 09/28/18 10:29 AM Phone: [phone] Fax: [phone]

## 2018-09-30 ENCOUNTER — Ambulatory Visit: Payer: 59 | Admitting: Physical Therapy

## 2018-09-30 ENCOUNTER — Other Ambulatory Visit: Payer: Self-pay

## 2018-09-30 ENCOUNTER — Encounter: Payer: Self-pay | Admitting: Physical Therapy

## 2018-09-30 DIAGNOSIS — G8929 Other chronic pain: Secondary | ICD-10-CM

## 2018-09-30 DIAGNOSIS — M6281 Muscle weakness (generalized): Secondary | ICD-10-CM

## 2018-09-30 DIAGNOSIS — M25552 Pain in left hip: Secondary | ICD-10-CM | POA: Diagnosis not present

## 2018-09-30 DIAGNOSIS — M25561 Pain in right knee: Secondary | ICD-10-CM

## 2018-09-30 DIAGNOSIS — R262 Difficulty in walking, not elsewhere classified: Secondary | ICD-10-CM

## 2018-09-30 DIAGNOSIS — M25562 Pain in left knee: Secondary | ICD-10-CM

## 2018-09-30 NOTE — Therapy (Signed)
[name], [name], [address] Phone: [phone]   Fax:  [phone]  Physical Therapy Treatment  Patient Details  Name: Michaela Wilson MRN: [id number] Date of Birth: 1966/01/31 Referring Provider (PT): Lanae Boast, FNP   Encounter Date: 09/30/2018  PT End of Session - 09/30/18 0930    Visit Number  19    Date for PT Re-Evaluation  10/14/18    PT Start Time  0920    PT Stop Time  1008    PT Time Calculation (min)  48 min    Activity Tolerance  Patient tolerated treatment well    Behavior During Therapy  [facility] for tasks assessed/performed       Past Medical History:  Diagnosis Date  . Anemia   . Cellulitis of right buttock 03/07/2015  . Hypertension   . Seasonal allergies   . Vertigo     Past Surgical History:  Procedure Laterality Date  . ABCESS DRAINAGE    . CHOLECYSTECTOMY    . TUBAL LIGATION      There were no vitals filed for this visit.  Subjective Assessment - 09/30/18 0921    Subjective  My hip is tender today knee is OK.  I notice at night my LLE turns outward alot more than my Rt.        [facility] Adult PT Treatment/Exercise - 09/30/18 0001      Lumbar Exercises: Supine   Pelvic Tilt  10 reps    Clam  10 reps    Clam Limitations  3 sets bilat and unilateral using ball     Heel Slides  10 reps    Bent Knee Raise  10 reps    Bridge with Ball Squeeze  10 reps    Advanced Lumbar Stabilization Limitations  used magic circle for overhead lift and added SLR x 10 each side        Knee/Hip Exercises: Supine   Bridges with [employer]  Strengthening;Right;Left;1 set;10 reps   1 set with circle for ABD/ER    Other Supine Knee/Hip Exercises  hip circles blue bandx 10 each direction       Cryotherapy   Number Minutes Cryotherapy  10 Minutes    Cryotherapy Location  Hip    Type of Cryotherapy  Ice pack      Manual Therapy   Soft tissue mobilization  IASTM lateral hip, glutes, too  tender <5 min     [place]  declined                PT Short Term Goals - 09/02/18 0902      PT SHORT TERM GOAL #1   Title  pt will be independent in her HEP.     Status  Achieved        PT Long Term Goals - 09/15/18 1540      PT LONG TERM GOAL #1   Title  Pt will improve her SLS on the L LE to >/= 20 seconds.     Status  On-going      PT LONG TERM GOAL #2   Title  pt will improve her L hip strength to >/= 4/5 in order to improve functional mobilty.     Status  On-going      PT LONG TERM GOAL #3   Title  Pt will be able to amb a flight of stairs step over step with pain </= 3/10 using single hand rail.  Status  On-going      PT LONG TERM GOAL #4   Title  pt will be able to walk for 20 minutes with pain </= 3/10.     Status  On-going            Plan - 09/30/18 1001    Clinical Impression Statement  FOcused on supine core work today, has some trouble maintaining core contraction.  Tenderness in L hip, Gr trochanter, did not tolerate even light pressure.    PT Treatment/Interventions  ADLs/Self Care Home Management;Electrical Stimulation;Moist Heat;Cryotherapy;Ultrasound;Gait training;Stair training;Functional mobility training;Balance training;Therapeutic exercise;Therapeutic activities;Patient/family education;Manual techniques;Passive range of motion;Dry needling;Taping;Iontophoresis 4mg /ml Dexamethasone    PT Next Visit Plan  how was the move? tape, cont closed chain and modalties as needed, Pilates, ionto?    PT Home Exercise Plan  Transverse abdominis contraction, hooklying ball squeeze, LTR, rt hip isometric flexion PRN. , piriformis , hip abd, hamstring and ITB, SLR/VMO    Consulted and Agree with Plan of Care  Patient       Patient will benefit from skilled therapeutic intervention in order to improve the following deficits and impairments:  Pain, Abnormal gait, Postural dysfunction, Decreased mobility, Decreased activity tolerance, Decreased range of  motion, Decreased strength, Impaired flexibility, Decreased balance, Difficulty walking  Visit Diagnosis: 1. Pain in left hip   2. Chronic pain of left knee   3. Muscle weakness (generalized)   4. Chronic pain of right knee   5. Difficulty in walking, not elsewhere classified        Problem List Patient Active Problem List   Diagnosis Date Noted  . Essential hypertension 06/25/2015  . Prediabetes 06/25/2015  . Sepsis ([place]) 03/07/2015  . Leukocytosis 03/07/2015    Michaela Wilson 09/30/2018, 11:16 AM  [facility] [address] [place], [name], [address] Phone: [phone]   Fax:  [phone]  Name: Michaela Wilson MRN: [id number] Date of Birth: 04-05-1965  Raeford Razor, PT 09/30/18 11:16 AM Phone: [phone] Fax: [phone]

## 2018-10-05 ENCOUNTER — Other Ambulatory Visit: Payer: Self-pay

## 2018-10-05 ENCOUNTER — Ambulatory Visit: Payer: 59 | Admitting: Physical Therapy

## 2018-10-05 ENCOUNTER — Encounter: Payer: Self-pay | Admitting: Physical Therapy

## 2018-10-05 DIAGNOSIS — R262 Difficulty in walking, not elsewhere classified: Secondary | ICD-10-CM

## 2018-10-05 DIAGNOSIS — M25562 Pain in left knee: Secondary | ICD-10-CM

## 2018-10-05 DIAGNOSIS — M25552 Pain in left hip: Secondary | ICD-10-CM | POA: Diagnosis not present

## 2018-10-05 DIAGNOSIS — G8929 Other chronic pain: Secondary | ICD-10-CM

## 2018-10-05 DIAGNOSIS — M25561 Pain in right knee: Secondary | ICD-10-CM

## 2018-10-05 DIAGNOSIS — M6281 Muscle weakness (generalized): Secondary | ICD-10-CM

## 2018-10-05 NOTE — Therapy (Signed)
[place], [name], [address] Phone: [phone]   Fax:  [phone]  Physical Therapy Treatment  Patient Details  Name: Michaela Wilson MRN: [id number] Date of Birth: [birth year] Referring Provider (PT): Lanae Boast, FNP   Encounter Date: 10/05/2018  PT End of Session - 10/05/18 0943    Visit Number  20    Date for PT Re-Evaluation  10/14/18    Authorization Type  UHC    PT Start Time  3646    PT Stop Time  1030    PT Time Calculation (min)  55 min    Activity Tolerance  Patient tolerated treatment well    Behavior During Therapy  [facility] for tasks assessed/performed       Past Medical History:  Diagnosis Date  . Anemia   . Cellulitis of right buttock 03/07/2015  . Hypertension   . Seasonal allergies   . Vertigo     Past Surgical History:  Procedure Laterality Date  . ABCESS DRAINAGE    . CHOLECYSTECTOMY    . TUBAL LIGATION      There were no vitals filed for this visit.  Subjective Assessment - 10/05/18 0935    Subjective  R/L hip alternating hurting today.  I have a headache today. Knee is OK.    Currently in Pain?  Yes    Pain Score  6     Pain Location  Hip    Pain Orientation  Left    Pain Descriptors / Indicators  Burning;Throbbing    Pain Type  Chronic pain    Pain Onset  More than a month ago    Pain Frequency  Intermittent    Aggravating Factors   stairs    Pain Relieving Factors  ice, IFC, tape    Pain Score  7    Pain Location  Head         [facility] PT Assessment - 10/05/18 0001      Strength   Right Hip Extension  4+/5    Right Hip ABduction  4+/5    Left Hip Extension  3+/5    Left Hip ABduction  4-/5          [facility] Adult PT Treatment/Exercise - 10/05/18 0001      Ambulation/Gait   Stairs  Yes    Stairs Assistance  6: Modified independent (Device/Increase time)    Stair Management Technique  No rails;Alternating pattern    Number of Stairs  24    Gait  Comments  has to go sideways end of the day due to pain       Pilates   Pilates Tower  see note       Knee/Hip Exercises: Standing   Hip Abduction  Stengthening;Both;1 set;15 reps    Lateral Step Up  Both;1 set;15 reps;Hand Hold: 1;Step Height: 6";Step Height: 8"    SLS  static and hip flexion for min dynamic in mirror, unable to stand on LLE for thiswithout flexing through trunk       Cryotherapy   Number Minutes Cryotherapy  10 Minutes    Cryotherapy Location  Hip    Type of Cryotherapy  Ice pack        Pilates Tower for LE/Core strength, postural strength, lumbopelvic disassociation and core control.  Exercises included:  Supine Leg [place]:Yellow   single leg arcs in parallel and hip ER x 10 each  circles, opposite knee bent for stability, mod cues needed for  neutral lumbar   spine  Double leg Arcs circles and squats , lacks core control but can correct for 1-2 reps in a row           PT Short Term Goals - 09/02/18 0902      PT SHORT TERM GOAL #1   Title  pt will be independent in her HEP.     Status  Achieved        PT Long Term Goals - 10/05/18 1003      PT LONG TERM GOAL #1   Title  Pt will improve her SLS on the L LE to >/= 20 seconds.     Baseline  LLE 20 sec, RLE 30 sec    Status  Achieved      PT LONG TERM GOAL #2   Title  pt will improve her L hip strength to >/= 4/5 in order to improve functional mobilty.     Baseline  see notes, L hip 4-/5 to 4/5    Status  Partially Met      PT LONG TERM GOAL #3   Title  Pt will be able to amb a flight of stairs step over step with pain </= 3/10 using single hand rail.     Baseline  pain increased today , 6/10      PT LONG TERM GOAL #4   Title  pt will be able to walk for 20 minutes with pain </= 3/10.     Baseline  15 min pain >6/10 or more    Status  On-going            Plan - 10/05/18 0954    Clinical Impression Statement  Worked on [employer] for simultaneous dynamic stretching and  stability. Min to mod cues to maintain lumbar and pelvic neutral. SLS goal met.  Pain increased >mod with walking >15 min .  I mproving functionally but continues to have increased pain.  She may benefit from dry nedling to address pain in L hip, lateral thigh.  If beneficial she may continue for another 3-4 weeks for PT.    PT Treatment/Interventions  ADLs/Self Care Home Management;Electrical Stimulation;Moist Heat;Cryotherapy;Ultrasound;Gait training;Stair training;Functional mobility training;Balance training;Therapeutic exercise;Therapeutic activities;Patient/family education;Manual techniques;Passive range of motion;Dry needling;Taping;Iontophoresis 68m/ml Dexamethasone    PT Next Visit Plan  DN L glute and ITB , cont functional strength of L hip, step ups, Pilates    PT Home Exercise Plan  Transverse abdominis contraction, hooklying ball squeeze, LTR, rt hip isometric flexion PRN. , piriformis , hip abd, hamstring and ITB, SLR/VMO    Consulted and Agree with Plan of Care  Patient       Patient will benefit from skilled therapeutic intervention in order to improve the following deficits and impairments:  Pain, Abnormal gait, Postural dysfunction, Decreased mobility, Decreased activity tolerance, Decreased range of motion, Decreased strength, Impaired flexibility, Decreased balance, Difficulty walking  Visit Diagnosis: 1. Pain in left hip   2. Chronic pain of left knee   3. Muscle weakness (generalized)   4. Chronic pain of right knee   5. Difficulty in walking, not elsewhere classified        Problem List Patient Active Problem List   Diagnosis Date Noted  . Essential hypertension 06/25/2015  . Prediabetes 06/25/2015  . [place] 03/07[place]  . Leukocytosis 03/07/2015    , 10/05/2018, 12:04 PM  [facility] [place] [address]Phone: [phone]  Fax:  [phone]  Name: SAMYRIA RUDIE MRN: [id number] Date of Birth: 07/09/1965  Raeford Razor, PT 10/05/18 12:04 PM Phone: [phone] Fax: [phone]

## 2018-10-07 ENCOUNTER — Other Ambulatory Visit: Payer: Self-pay

## 2018-10-07 ENCOUNTER — Encounter: Payer: 59 | Admitting: Physical Therapy

## 2018-10-07 ENCOUNTER — Ambulatory Visit: Payer: 59 | Admitting: Physical Therapy

## 2018-10-07 ENCOUNTER — Encounter: Payer: Self-pay | Admitting: Physical Therapy

## 2018-10-07 DIAGNOSIS — M25562 Pain in left knee: Secondary | ICD-10-CM

## 2018-10-07 DIAGNOSIS — M6281 Muscle weakness (generalized): Secondary | ICD-10-CM

## 2018-10-07 DIAGNOSIS — M25552 Pain in left hip: Secondary | ICD-10-CM | POA: Diagnosis not present

## 2018-10-07 DIAGNOSIS — G8929 Other chronic pain: Secondary | ICD-10-CM

## 2018-10-07 NOTE — Therapy (Signed)
[place], [name], [address] Phone: [phone]   Fax:  [phone]  Physical Therapy Treatment  Patient Details  Name: MINDY GALI MRN: [id number] Date of Birth: 08/04/1965 Referring Provider (PT): Lanae Boast, FNP   Encounter Date: 10/07/2018  PT End of Session - 10/07/18 1628    Visit Number  21    Date for PT Re-Evaluation  10/14/18    Authorization Type  U[employer]    PT Start Time  8264    PT Stop Time  1655    PT Time Calculation (min)  38 min    Activity Tolerance  Patient tolerated treatment well    Behavior During Therapy  [facility] for tasks assessed/performed       Past Medical History:  Diagnosis Date  . Anemia   . Cellulitis of right buttock 03/07/2015  . Hypertension   . Seasonal allergies   . Vertigo     Past Surgical History:  Procedure Laterality Date  . ABCESS DRAINAGE    . CHOLECYSTECTOMY    . TUBAL LIGATION      There were no vitals filed for this visit.  Subjective Assessment - 10/07/18 1627    Subjective  Left hip and thigh are burning    Patient Stated Goals  work without pain, walk longer distances, be able to shop without pain    Currently in Pain?  Yes    Pain Score  5     Pain Location  Leg    Pain Orientation  Left;Upper    Pain Descriptors / Indicators  Burning                       [facility] Adult PT Treatment/Exercise - 10/07/18 0001      Knee/Hip Exercises: Stretches   Other Knee/Hip Stretches  figure 4 stretch    Other Knee/Hip Stretches  lower trunk rotation      Knee/Hip Exercises: Aerobic   Nustep  5 min L5 LE only       Trigger Point Dry Needling - 10/07/18 0001    Consent Given?  Yes    Education Handout Provided  --   verbal education   Muscles Treated Lower Quadrant  Vastus lateralis    Muscles Treated Back/Hip  Gluteus minimus;Gluteus medius;Piriformis;Tensor fascia lata    Other Dry Needling  all on Left side    Vastus  lateralis Response  Twitch response elicited;Palpable increased muscle length    Gluteus Minimus Response  Twitch response elicited;Palpable increased muscle length    Gluteus Medius Response  Twitch response elicited;Palpable increased muscle length    Piriformis Response  Twitch response elicited;Palpable increased muscle length    Tensor Fascia Lata Response  Twitch response elicited;Palpable increased muscle length           PT Education - 10/07/18 1750    Education Details  TPDN & expected outcomes, seated posture    Person(s) Educated  Patient    Methods  Explanation    Comprehension  Verbalized understanding;Need further instruction       PT Short Term Goals - 09/02/18 0902      PT SHORT TERM GOAL #1   Title  pt will be independent in her HEP.     Status  Achieved        PT Long Term Goals - 10/05/18 1003      PT LONG TERM GOAL #1   Title  Pt will improve  her SLS on the L LE to >/= 20 seconds.     Baseline  LLE 20 sec, RLE 30 sec    Status  Achieved      PT LONG TERM GOAL #2   Title  pt will improve her L hip strength to >/= 4/5 in order to improve functional mobilty.     Baseline  see notes, L hip 4-/5 to 4/5    Status  Partially Met      PT LONG TERM GOAL #3   Title  Pt will be able to amb a flight of stairs step over step with pain </= 3/10 using single hand rail.     Baseline  pain increased today , 6/10      PT LONG TERM GOAL #4   Title  pt will be able to walk for 20 minutes with pain </= 3/10.     Baseline  15 min pain >6/10 or more    Status  On-going            Plan - 10/07/18 1748    Clinical Impression Statement  TPDN to Lt LE tolerated well but worked slowly to encourage pt to relax. Pt reported feeling the soreness that was expected with reduction in tension and burning that she complained of in the beginning. she reports that she does sit with her legs crossed and I asked her to always hold something between her knees to decrease this  tendency.    PT Treatment/Interventions  ADLs/Self Care Home Management;Electrical Stimulation;Moist Heat;Cryotherapy;Ultrasound;Gait training;Stair training;Functional mobility training;Balance training;Therapeutic exercise;Therapeutic activities;Patient/family education;Manual techniques;Passive range of motion;Dry needling;Taping;Iontophoresis 66m/ml Dexamethasone    PT Next Visit Plan  did she avoid crossing her legs? assess DN outcome & ERO v D/C    PT Home Exercise Plan  Transverse abdominis contraction, hooklying ball squeeze, LTR, rt hip isometric flexion PRN. , piriformis , hip abd, hamstring and ITB, SLR/VMO    Consulted and Agree with Plan of Care  Patient       Patient will benefit from skilled therapeutic intervention in order to improve the following deficits and impairments:  Pain, Abnormal gait, Postural dysfunction, Decreased mobility, Decreased activity tolerance, Decreased range of motion, Decreased strength, Impaired flexibility, Decreased balance, Difficulty walking  Visit Diagnosis: 1. Pain in left hip   2. Chronic pain of left knee   3. Muscle weakness (generalized)        Problem List Patient Active Problem List   Diagnosis Date Noted  . Essential hypertension 06/25/2015  . Prediabetes 06/25/2015  . Sepsis ([place] 03/07/2015  . Leukocytosis 03/07/2015    Vian Fluegel C. Mckynleigh Mussell PT, DPT 10/07/18 5:51 PM   [facility] [place] [address]Phone: [phone]  Fax:  [phone] Name: TDANESE DORSAINVILMRN: [id number] of Birth: 103-Jan-1967

## 2018-10-19 ENCOUNTER — Ambulatory Visit: Payer: 59 | Attending: Family Medicine | Admitting: Physical Therapy

## 2018-10-19 ENCOUNTER — Encounter: Payer: Self-pay | Admitting: Physical Therapy

## 2018-10-19 ENCOUNTER — Other Ambulatory Visit: Payer: Self-pay

## 2018-10-19 DIAGNOSIS — G8929 Other chronic pain: Secondary | ICD-10-CM

## 2018-10-19 DIAGNOSIS — R262 Difficulty in walking, not elsewhere classified: Secondary | ICD-10-CM | POA: Diagnosis present

## 2018-10-19 DIAGNOSIS — M25552 Pain in left hip: Secondary | ICD-10-CM | POA: Diagnosis not present

## 2018-10-19 DIAGNOSIS — M25562 Pain in left knee: Secondary | ICD-10-CM | POA: Insufficient documentation

## 2018-10-19 DIAGNOSIS — M6281 Muscle weakness (generalized): Secondary | ICD-10-CM | POA: Insufficient documentation

## 2018-10-19 DIAGNOSIS — M25561 Pain in right knee: Secondary | ICD-10-CM | POA: Insufficient documentation

## 2018-10-19 NOTE — Therapy (Signed)
[place], [name], [address] Phone: [phone]   Fax:  [phone]  Physical Therapy Treatment/Re-Evaluation   Patient Details  Name: Michaela Wilson MRN: [id number] Date of Birth: 07-05-1965 Referring Provider (PT): Lanae Boast, FNP   Encounter Date: 10/19/2018  PT End of Session - 10/19/18 1044    Visit Number  22    Date for PT Re-Evaluation  11/19/18    Authorization Type  UHC    PT Start Time  1000    PT Stop Time  1045    PT Time Calculation (min)  45 min    Activity Tolerance  Patient tolerated treatment well    Behavior During Therapy  [facility] for tasks assessed/performed       Past Medical History:  Diagnosis Date  . Anemia   . Cellulitis of right buttock 03/07/2015  . Hypertension   . Seasonal allergies   . Vertigo     Past Surgical History:  Procedure Laterality Date  . ABCESS DRAINAGE    . CHOLECYSTECTOMY    . TUBAL LIGATION      There were no vitals filed for this visit.  Subjective Assessment - 10/19/18 1009    Subjective  She liked the needling.  Points to ITB and glute.  Did have pain in Rt side but has not had any soreness in L side all last week.  Has been moving, lifting, squatting and stairs.    Currently in Pain?  Yes    Pain Score  2     Pain Location  Hip    Pain Orientation  Left    Pain Descriptors / Indicators  Burning;Aching    Pain Type  Chronic pain    Pain Onset  More than a month ago    Pain Frequency  Intermittent    Pain Score  4    Pain Location  Hip    Pain Orientation  Right    Pain Descriptors / Indicators  Sore    Pain Type  Chronic pain    Pain Onset  More than a month ago    Pain Frequency  Intermittent         [facility] PT Assessment - 10/19/18 0001      Observation/Other Assessments   Focus on Therapeutic Outcomes (FOTO)   NT            [facility] Adult PT Treatment/Exercise - 10/19/18 0001      Self-Care   Other Self-Care Comments    dry needling, POC , goals       Lumbar Exercises: Aerobic   Tread Mill  8 min, 2 % grade, 2.0 mph       Knee/Hip Exercises: Stretches   ITB Stretch  Both;3 reps    Piriformis Stretch  3 reps;30 seconds           PT Short Term Goals - 10/19/18 1016      PT SHORT TERM GOAL #1   Title  pt will be independent in her HEP.     Status  Achieved        PT Long Term Goals - 10/19/18 1016      PT LONG TERM GOAL #1   Title  Pt will improve her SLS on the L LE to >/= 20 seconds.     Baseline  RT LE 30 sec, LLE 30 sec    Status  Achieved      PT LONG TERM GOAL #2  Title  pt will improve her L hip strength to >/= 4/5 in order to improve functional mobilty.     Baseline  Lt hip abd 4+/5, ext 4+/5 bilaterally , Rt hip abd 5/5    Status  Achieved      PT LONG TERM GOAL #3   Title  Pt will be able to amb a flight of stairs step over step with pain </= 3/10 using single hand rail.     Baseline  pain better , will cont to work on    Status  Partially [place] #4   Title  pt will be able to walk for 20 minutes with pain </= 3/10.     Status  Partially Met      PT LONG TERM GOAL #5   Title  Pt will be able to squat and get up from the floor with min to no difficulty.    Time  4    Period  Weeks    Status  New    Target Date  11/16/18            Plan - 10/19/18 1041    Clinical Impression Statement  Pt will be seen for another 4 weeks to incorporate dry needling into POC another 1-2 times. SHe had an excellent outcome from her last session of dry needling and despite increaed physical activity she has maintained a low level of pain throughout the week.    Examination-Activity Limitations  Squat;Stairs;Stand;Dressing;Sit    PT Treatment/Interventions  ADLs/Self Care Home Management;Electrical Stimulation;Moist Heat;Cryotherapy;Ultrasound;Gait training;Stair training;Functional mobility training;Balance training;Therapeutic exercise;Therapeutic  activities;Patient/family education;Manual techniques;Passive range of motion;Dry needling;Taping;Iontophoresis 31m/ml Dexamethasone    PT Next Visit Plan  Pilates, DN if neeed, cont. strengthening, treadmill    PT Home Exercise Plan  Transverse abdominis contraction, hooklying ball squeeze, LTR, rt hip isometric flexion PRN. , piriformis , hip abd, hamstring and ITB, SLR/VMO    Consulted and Agree with Plan of Care  Patient       Patient will benefit from skilled therapeutic intervention in order to improve the following deficits and impairments:  Pain, Abnormal gait, Postural dysfunction, Decreased mobility, Decreased activity tolerance, Decreased range of motion, Decreased strength, Impaired flexibility, Decreased balance, Difficulty walking  Visit Diagnosis: 1. Pain in left hip   2. Chronic pain of left knee   3. Muscle weakness (generalized)   4. Chronic pain of right knee   5. Difficulty in walking, not elsewhere classified        Problem List Patient Active Problem List   Diagnosis Date Noted  . Essential hypertension 06/25/2015  . Prediabetes 06/25/2015  . Sepsis ([place] 03/07/2015  . Leukocytosis 03/07/2015    Michaela Wilson 10/19/2018, 11:15 AM  [facility]G[place] [address]Phone: [phone]  Fax:  [phone] Name: Michaela CAMMONMRN: [id number] of Birth: 1May 04, 1967 Michaela Wilson PT 10/19/18 11:15 AM Phone: [phone]: [phone]

## 2018-10-21 ENCOUNTER — Ambulatory Visit: Payer: 59 | Admitting: Physical Therapy

## 2018-10-25 ENCOUNTER — Ambulatory Visit: Payer: 59 | Admitting: Family Medicine

## 2018-11-02 ENCOUNTER — Other Ambulatory Visit: Payer: Self-pay

## 2018-11-02 ENCOUNTER — Encounter: Payer: Self-pay | Admitting: Physical Therapy

## 2018-11-02 ENCOUNTER — Ambulatory Visit: Payer: 59 | Admitting: Physical Therapy

## 2018-11-02 DIAGNOSIS — M25562 Pain in left knee: Secondary | ICD-10-CM

## 2018-11-02 DIAGNOSIS — G8929 Other chronic pain: Secondary | ICD-10-CM

## 2018-11-02 DIAGNOSIS — M6281 Muscle weakness (generalized): Secondary | ICD-10-CM

## 2018-11-02 DIAGNOSIS — M25552 Pain in left hip: Secondary | ICD-10-CM | POA: Diagnosis not present

## 2018-11-02 DIAGNOSIS — M25561 Pain in right knee: Secondary | ICD-10-CM

## 2018-11-02 DIAGNOSIS — R262 Difficulty in walking, not elsewhere classified: Secondary | ICD-10-CM

## 2018-11-02 NOTE — Therapy (Signed)
[place], [name], [address] Phone: [phone]   Fax:  [phone]  Physical Therapy Treatment  Patient Details  Name: Michaela Wilson MRN: [id number] Date of Birth: May 22, 1965 Referring Provider (PT): Lanae Boast, FNP   Encounter Date: 11/02/2018  PT End of Session - 11/02/18 0840    Visit Number  23    Date for PT Re-Evaluation  11/19/18    PT Start Time  0833    Activity Tolerance  Patient tolerated treatment well    Behavior During Therapy  [facility] for tasks assessed/performed       Past Medical History:  Diagnosis Date  . Anemia   . Cellulitis of right buttock 03/07/2015  . Hypertension   . Seasonal allergies   . Vertigo     Past Surgical History:  Procedure Laterality Date  . ABCESS DRAINAGE    . CHOLECYSTECTOMY    . TUBAL LIGATION      There were no vitals filed for this visit.  Subjective Assessment - 11/02/18 0833    Subjective  L foot tingling since the needling, constant and mild (not in toes) in the bottom of foot.  Knee hurt her last week, didnt pop, was hurting all week and hip a little.    Currently in Pain?  Yes    Pain Score  6     Pain Location  Hip   and knee   Pain Orientation  Left    Pain Descriptors / Indicators  Aching;Burning    Pain Type  Chronic pain    Pain Onset  More than a month ago    Pain Frequency  Intermittent    Aggravating Factors   stairs, walking too much    Pain Relieving Factors  ice, IFC, tape           [facility] Adult PT Treatment/Exercise - 11/02/18 0001      Pilates   Pilates Reformer  see note         Pilates Reformer used for LE/core strength, postural strength, lumbopelvic disassociation and core control.  Exercises included:  Footwork 2 Red 1 Blue parallel and turnout, used ball for core activation.  Double and single leg press out  Bridging 2 Red 1 blue x 10 articulation with ball.  Single leg bridge x 10  Feet in Straps 1 Red  single  Leg stretching to hip (3 way )  and then core work utilizing single red spring Single leg stretching (knee/hip flexion x 10) Single leg arcs x 10  Mod cues for finding table top with 1 strap.     PT Short Term Goals - 10/19/18 1016      PT SHORT TERM GOAL #1   Title  pt will be independent in her HEP.     Status  Achieved        PT Long Term Goals - 10/19/18 1016      PT LONG TERM GOAL #1   Title  Pt will improve her SLS on the L LE to >/= 20 seconds.     Baseline  RT LE 30 sec, LLE 30 sec    Status  Achieved      PT LONG TERM GOAL #2   Title  pt will improve her L hip strength to >/= 4/5 in order to improve functional mobilty.     Baseline  Lt hip abd 4+/5, ext 4+/5 bilaterally , Rt hip abd 5/5    Status  Achieved      PT LONG TERM GOAL #3   Title  Pt will be able to amb a flight of stairs step over step with pain </= 3/10 using single hand rail.     Baseline  pain better , will cont to work on    Status  Partially [place] #4   Title  pt will be able to walk for 20 minutes with pain </= 3/10.     Status  Partially Met      PT LONG TERM GOAL #5   Title  Pt will be able to squat and get up from the floor with min to no difficulty.    Time  4    Period  Weeks    Status  New    Target Date  11/16/18            Plan - 11/02/18 0901    Clinical Impression Statement  Patient with mild to moderate pain in knee and hip over the last week.  She continues to do her HEP daily.  Knows this may be her "new normal".  She does have tingling on the bottom of her foot which she reports is since dry needling.  I don't think Dry needling would cause this, she does state she was prediabetic.  Has follow up with Lanae Boast later in the week.  Would like to do dry needling again if possible.    PT Treatment/Interventions  ADLs/Self Care Home Management;Electrical Stimulation;Moist Heat;Cryotherapy;Ultrasound;Gait training;Stair training;Functional  mobility training;Balance training;Therapeutic exercise;Therapeutic activities;Patient/family education;Manual techniques;Passive range of motion;Dry needling;Taping;Iontophoresis 39m/ml Dexamethasone    PT Next Visit Plan  Pilates, DN if neeed, cont. strengthening, treadmill    PT Home Exercise Plan  Transverse abdominis contraction, hooklying ball squeeze, LTR, rt hip isometric flexion PRN. , piriformis , hip abd, hamstring and ITB, SLR/VMO    Consulted and Agree with Plan of Care  Patient       Patient will benefit from skilled therapeutic intervention in order to improve the following deficits and impairments:  Pain, Abnormal gait, Postural dysfunction, Decreased mobility, Decreased activity tolerance, Decreased range of motion, Decreased strength, Impaired flexibility, Decreased balance, Difficulty walking  Visit Diagnosis: 1. Pain in left hip   2. Chronic pain of left knee   3. Muscle weakness (generalized)   4. Chronic pain of right knee   5. Difficulty in walking, not elsewhere classified        Problem List Patient Active Problem List   Diagnosis Date Noted  . Essential hypertension 06/25/2015  . Prediabetes 06/25/2015  . Sepsis ([place] 03/07/2015  . Leukocytosis 03/07/2015    Michaela Wilson 11/02/2018, 9:09 AM  [facility] [place] [address]Phone: [phone]  Fax:  [phone] Name: Michaela ILAGANMRN: [id number] of Birth: [birth year] JRaeford Razor PT 11/02/18 9:10 AM Phone: [phone]: [phone]

## 2018-11-04 ENCOUNTER — Encounter: Payer: Self-pay | Admitting: Family Medicine

## 2018-11-04 ENCOUNTER — Ambulatory Visit (INDEPENDENT_AMBULATORY_CARE_PROVIDER_SITE_OTHER): Payer: 59 | Admitting: Family Medicine

## 2018-11-04 ENCOUNTER — Other Ambulatory Visit: Payer: Self-pay

## 2018-11-04 VITALS — BP 128/79 | HR 82 | Temp 98.5°F | Resp 14 | Ht 62.0 in | Wt 155.0 lb

## 2018-11-04 DIAGNOSIS — F4310 Post-traumatic stress disorder, unspecified: Secondary | ICD-10-CM

## 2018-11-04 DIAGNOSIS — I10 Essential (primary) hypertension: Secondary | ICD-10-CM

## 2018-11-04 LAB — POCT URINALYSIS DIPSTICK
Bilirubin, UA: NEGATIVE
Blood, UA: NEGATIVE
Glucose, UA: NEGATIVE
Ketones, UA: NEGATIVE
Leukocytes, UA: NEGATIVE
Nitrite, UA: NEGATIVE
Protein, UA: NEGATIVE
Spec Grav, UA: >=1.03 — AB (ref 1.010–1.025)
Urobilinogen, UA: 0.2 E.U./dL
pH, UA: 5.5 (ref 5.0–8.0)

## 2018-11-04 MED ORDER — PRAZOSIN HCL 1 MG PO CAPS: 1.0000 mg | ORAL_CAPSULE | Freq: Every day | ORAL | 2 refills | Status: DC

## 2018-11-04 MED ORDER — SERTRALINE HCL 25 MG PO TABS: 25.0000 mg | ORAL_TABLET | Freq: Every day | ORAL | 0 refills | Status: DC
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
## 2018-11-04 NOTE — Patient Instructions (Addendum)
I am starting you on a new medication in the SSRI/SNRI family for your depression/anxiety. Take this medication daily as it has been prescribed. You may experience gastrointestinal upset. This usually will stop after you are on the medication for a few days. If you have feelings of euphoria (happy and high), stop the medication immediately and go to the nearest emergency department. If you have suicidal or homicidal thoughts, delusions or hallucinations, stop the medication immediately and go to the nearest emergency department. I would like to see you again in 3 weeks.   Managing Post-Traumatic Stress Disorder If you have been diagnosed with post-traumatic stress disorder (PTSD), you may be relieved that you now know why you have felt or behaved a certain way. Still, you may feel overwhelmed about the treatment ahead. You may also wonder how to get the support you need and how to deal with the condition day-to-day. If you are living with PTSD, there are ways to help you recover from it and manage your symptoms. How to manage lifestyle changes Managing stress Stress is your body's reaction to life changes and events, both good and bad. Stress can make PTSD worse. Take the following steps to manage stress:  Talk with your health care provider or a counselor if you would like to learn more about techniques to reduce your stress. He or she may suggest some stress reduction techniques such as: ? Muscle relaxation exercises. ? Regular exercise. ? Meditation, yoga, or other mind-body exercises. ? Breathing exercises. ? Listening to quiet music. ? Spending time outside.  Maintain a healthy lifestyle. Eat a healthy diet, exercise regularly, get plenty of sleep, and take time to relax.  Spend time with others. Talk with them about how you are feeling and what kind of support you need. Try not to isolate yourself, even though you may feel like doing that. Isolating yourself can delay your recovery.  Do  activities and hobbies that you enjoy.  Pace yourself when doing stressful things. Take breaks, and reward yourself when you finish. Make sure that you do not overload your schedule.  Medicines Your health care provider may suggest certain medicines if he or she feels that they will help to improve your condition. Medicines for depression (antidepressants) or severe loss of contact with reality (antipsychotics) may be used to treat PTSD. Avoid using alcohol and other substances that may prevent your medicines from working properly. It is also important to:  Talk with your pharmacist or health care provider about all medicines that you take, their possible side effects, and which medicines are safe to take together.  Make it your goal to take part in all treatment decisions (shared decision-making). Ask about possible side effects of medicines that your health care provider recommends, and tell him or her how you feel about having those side effects. It is best if shared decision-making with your health care provider is part of your total treatment plan. If your health care provider prescribes a medicine, you may not notice the full benefits of it for 4-8 weeks. Most people who are treated for PTSD need to take medicine for at least 6-12 months after they feel better. If you are taking medicines as part of your treatment, do not stop taking medicines before you ask your health care provider if it is safe to stop. You may need to have the medicine slowly decreased (tapered) over time to lower the risk of harmful side effects. Relationships Many people who have PTSD have difficulty  trusting others. Make an effort to:  Take risks and develop trust with close friends and family members. Developing trust in others can help you feel safe and connect you with emotional support.  Be open and honest about your feelings.  Have fun and relax in safe spaces, such as with friends and family.  Think about going  to couples counseling, family education classes, or family therapy. Your loved ones may not always know how to be supportive. Therapy can be helpful for everyone. How to recognize changes in your condition Be aware of your symptoms and how often you have them. The following symptoms mean that you need to seek help for your PTSD:  You feel suspicious and angry.  You have repeated flashbacks.  You avoid going out or being with others.  You have an increasing number of fights with close friends or family members, such as your spouse.  You have thoughts about hurting yourself or others.  You cannot get relief from feelings of depression or anxiety. Follow these instructions at home: Lifestyle  Exercise regularly. Try to do 30 or more minutes of physical activity on most days of the week.  Try to get 7-9 hours of sleep each night. To help with sleep: ? Keep your bedroom cool and dark. ? Avoid screen time before bedtime. This means avoiding use of your TV, computer, tablet, and cell phone.  Practice self-soothing skills and use them daily.  Try to have fun and seek humor in your life. Eating and drinking  Do not eat a heavy meal during the hour before you go to bed.  Do not drink alcohol or caffeinated drinks before bed.  Avoid using alcohol or drugs. General instructions  If your PTSD is affecting your marriage or family, seek help from a family therapist.  Take over-the-counter and prescription medicines only as told by your health care provider.  Make sure to let all of your health care providers know that you have PTSD. This is especially important if you are having surgery or need to be admitted to the hospital.  Keep all follow-up visits as told by your health care providers. This is important. Where to find support Talking to others  Explain that PTSD is a mental health problem. It is something that a person can develop after experiencing or seeing a life-threatening  event. Tell them that PTSD makes you feel stress like you did during the event.  Talk to your loved ones about the symptoms you have. Also tell them what things or situations can cause symptoms to start (are triggers for you).  Assure your loved ones that there are treatments to help PTSD. Discuss possibly seeking family therapy or couples therapy.  If you are worried or fearful about seeking treatment, ask for support.  Keep daily contact with at least one trusted friend or family member. Finances Not all insurance plans cover mental health care, so it is important to check with your insurance carrier. If paying for co-pays or counseling services is a problem, search for a local or county mental health care center. Public mental health care services may be offered there at a low cost or no cost when you are not able to see a private health care provider. If you are a veteran, contact a local veterans organization or veterans hospital for more information. If you are taking medicine for PTSD, you may be able to get the genericform, which may be less expensive than brand-name medicine. Some makers of  prescription medicines also offer help to patients who cannot afford the medicines that they need. Therapy and support groups  Find a support group in your community. Often, groups are available for [employer] veterans, trauma victims, and family members or caregivers.  Look into volunteer opportunities. Taking part in these can help you feel more connected to your community.  Contact a local organization to find out if you are eligible for a service dog. Where to find more information Go to this website to find more information about PTSD, treatment of PTSD, and how to get support:  [facility] for PTSD: www.ptsd.[employer]ballBu[employer].cz Contact a health care provider if:  Your symptoms get worse or do not get better. Get help right away if:  You have thoughts about hurting yourself or others. If you ever  feel like you may hurt yourself or others, or have thoughts about taking your own life, get help right away. You can go to your nearest emergency department or call:  Your local emergency services (911 in the [place]  A suicide crisis helpline, such as the [facility] at 463-355-1428. This is open 24-hours a day. Summary  If you are living with PTSD, there are ways to help you recover from it and manage your symptoms.  Find supportive environments and people who understand PTSD. Spend time in those places, and maintain contact with those people.  Work with your health care team to create a plan for managing PTSD. The plan should include counseling, stress reduction techniques, and healthy lifestyle habits. This information is not intended to replace advice given to you by your health care provider. Make sure you discuss any questions you have with your health care provider. Document Released: 07/03/2016 Document Revised: 06/25/2018 Document Reviewed: 07/03/2016 Elsevier Patient Education  [place].

## 2018-11-09 ENCOUNTER — Encounter: Payer: Self-pay | Admitting: Physical Therapy

## 2018-11-09 ENCOUNTER — Other Ambulatory Visit: Payer: Self-pay

## 2018-11-09 ENCOUNTER — Ambulatory Visit: Payer: 59 | Admitting: Physical Therapy

## 2018-11-09 DIAGNOSIS — M25552 Pain in left hip: Secondary | ICD-10-CM | POA: Diagnosis not present

## 2018-11-09 DIAGNOSIS — M25562 Pain in left knee: Secondary | ICD-10-CM

## 2018-11-09 DIAGNOSIS — M6281 Muscle weakness (generalized): Secondary | ICD-10-CM

## 2018-11-09 DIAGNOSIS — G8929 Other chronic pain: Secondary | ICD-10-CM

## 2018-11-09 NOTE — Therapy (Signed)
[place], [name], [address] Phone: [phone]   Fax:  [phone]  Physical Therapy Treatment  Patient Details  Name: Michaela Wilson MRN: [id number] Date of Birth: [birth year] Referring Provider (PT): Lanae Boast, FNP   Encounter Date: 11/09/2018  PT End of Session - 11/09/18 0846    Visit Number  24    Date for PT Re-Evaluation  11/19/18    PT Start Time  0846    PT Stop Time  0926    PT Time Calculation (min)  40 min    Activity Tolerance  Patient tolerated treatment well    Behavior During Therapy  [facility] for tasks assessed/performed       Past Medical History:  Diagnosis Date  . Anemia   . Cellulitis of right buttock 03/07/2015  . Hypertension   . Seasonal allergies   . Vertigo     Past Surgical History:  Procedure Laterality Date  . ABCESS DRAINAGE    . CHOLECYSTECTOMY    . TUBAL LIGATION      There were no vitals filed for this visit.  Subjective Assessment - 11/09/18 0847    Subjective  It feels okay today. Tingling in foot has stopped. I was able to move the week following dry needling.    Patient Stated Goals  work without pain, walk longer distances, be able to shop without pain    Currently in Pain?  Yes    Pain Score  5     Pain Location  Hip    Pain Orientation  Left    Pain Descriptors / Indicators  Sore         [facility] PT Assessment - 11/09/18 0001      Strength   Left Hip ABduction  4+/5   following dry needling                  [facility] Adult PT Treatment/Exercise - 11/09/18 0001      Knee/Hip Exercises: Aerobic   Elliptical  5 min L1 ramp 7      Knee/Hip Exercises: Seated   Sit to Sand  10 reps;without UE support   cues for core & glut set     Knee/Hip Exercises: Sidelying   Hip ABduction  Both;20 reps      Manual Therapy   Manual therapy comments  skilled palpation and monitoring during TPDN    Soft tissue mobilization  Lt piriformis, glut  min/med/TFL       Trigger Point Dry Needling - 11/09/18 0001    Gluteus Minimus Response  Twitch response elicited;Palpable increased muscle length   Left   Gluteus Medius Response  Twitch response elicited;Palpable increased muscle length   Left   Piriformis Response  Twitch response elicited;Palpable increased muscle length   Left   Tensor Fascia Lata Response  Twitch response elicited;Palpable increased muscle length   Left            PT Short Term Goals - 10/19/18 1016      PT SHORT TERM GOAL #1   Title  pt will be independent in her HEP.     Status  Achieved        PT Long Term Goals - 10/19/18 1016      PT LONG TERM GOAL #1   Title  Pt will improve her SLS on the L LE to >/= 20 seconds.     Baseline  RT LE 30 sec, LLE 30 sec  Status  Achieved      PT LONG TERM GOAL #2   Title  pt will improve her L hip strength to >/= 4/5 in order to improve functional mobilty.     Baseline  Lt hip abd 4+/5, ext 4+/5 bilaterally , Rt hip abd 5/5    Status  Achieved      PT LONG TERM GOAL #3   Title  Pt will be able to amb a flight of stairs step over step with pain </= 3/10 using single hand rail.     Baseline  pain better , will cont to work on    Status  Partially [place] #4   Title  pt will be able to walk for 20 minutes with pain </= 3/10.     Status  Partially Met      PT LONG TERM GOAL #5   Title  Pt will be able to squat and get up from the floor with min to no difficulty.    Time  4    Period  Weeks    Status  New    Target Date  11/16/18            Plan - 11/09/18 3329    Clinical Impression Statement  Pt reported feeling the tingling in the bottom of her foot return with DN to priformis, indicating referral pattern from trigger point. Increase in strength and decrease in bursa pain. Enjoys doing reformer/tower and feels it is making her stronger.    PT Treatment/Interventions  ADLs/Self Care Home Management;Electrical  Stimulation;Moist Heat;Cryotherapy;Ultrasound;Gait training;Stair training;Functional mobility training;Balance training;Therapeutic exercise;Therapeutic activities;Patient/family education;Manual techniques;Passive range of motion;Dry needling;Taping;Iontophoresis 7m/ml Dexamethasone    PT Next Visit Plan  stabilization strengthening CKC on reformer/tower    PT Home Exercise Plan  Transverse abdominis contraction, hooklying ball squeeze, LTR, rt hip isometric flexion PRN. , piriformis , hip abd, hamstring and ITB, SLR/VMO, sit>stand with glut set    Consulted and Agree with Plan of Care  Patient       Patient will benefit from skilled therapeutic intervention in order to improve the following deficits and impairments:  Pain, Abnormal gait, Postural dysfunction, Decreased mobility, Decreased activity tolerance, Decreased range of motion, Decreased strength, Impaired flexibility, Decreased balance, Difficulty walking  Visit Diagnosis: Pain in left hip  Chronic pain of left knee  Muscle weakness (generalized)     Problem List Patient Active Problem List   Diagnosis Date Noted  . Essential hypertension 06/25/2015  . Prediabetes 06/25/2015  . Sepsis ([place] 03/07/2015  . Leukocytosis 03/07/2015    Jazsmine Macari C. Zaine Elsass PT, DPT 11/09/18 1:05 PM   [facility] [address]Phone: [phone]  Fax:  [phone] Name: Michaela SORNMRN: [id number] of Birth: [birth year]

## 2018-11-16 ENCOUNTER — Encounter: Payer: Self-pay | Admitting: Physical Therapy

## 2018-11-16 ENCOUNTER — Other Ambulatory Visit: Payer: Self-pay

## 2018-11-16 ENCOUNTER — Ambulatory Visit: Payer: 59 | Attending: Family Medicine | Admitting: Physical Therapy

## 2018-11-16 DIAGNOSIS — M25561 Pain in right knee: Secondary | ICD-10-CM | POA: Insufficient documentation

## 2018-11-16 DIAGNOSIS — M25552 Pain in left hip: Secondary | ICD-10-CM | POA: Insufficient documentation

## 2018-11-16 DIAGNOSIS — M25562 Pain in left knee: Secondary | ICD-10-CM | POA: Insufficient documentation

## 2018-11-16 DIAGNOSIS — M6281 Muscle weakness (generalized): Secondary | ICD-10-CM | POA: Diagnosis present

## 2018-11-16 DIAGNOSIS — R262 Difficulty in walking, not elsewhere classified: Secondary | ICD-10-CM | POA: Diagnosis present

## 2018-11-16 DIAGNOSIS — G8929 Other chronic pain: Secondary | ICD-10-CM

## 2018-11-16 NOTE — Therapy (Signed)
[place], [name], [address] Phone: [phone]   Fax:  [phone]  Physical Therapy Treatment  Patient Details  Name: Michaela Wilson MRN: [id number] Date of Birth: [birth year] Referring Provider (PT): Lanae Boast, FNP   Encounter Date: 11/16/2018  PT End of Session - 11/16/18 0851    Visit Number  25    Date for PT Re-Evaluation  11/19/18    Authorization Type  [employer]    PT Start Time  0850    PT Stop Time  0929    PT Time Calculation (min)  39 min    Activity Tolerance  Patient tolerated treatment well    Behavior During Therapy  [facility] for tasks assessed/performed       Past Medical History:  Diagnosis Date  . Anemia   . Cellulitis of right buttock 03/07/2015  . Hypertension   . Seasonal allergies   . Vertigo     Past Surgical History:  Procedure Laterality Date  . ABCESS DRAINAGE    . CHOLECYSTECTOMY    . TUBAL LIGATION      There were no vitals filed for this visit.  Subjective Assessment - 11/16/18 0854    Subjective  I had to crawl on my knees to look for my shoes so my hip is about a 5 and knees are about a 4. getting up from the floor hurts.    Patient Stated Goals  work without pain, walk longer distances, be able to shop without pain    Currently in Pain?  Yes    Pain Score  5     Pain Location  Hip    Pain Orientation  Left         [facility] PT Assessment - 11/16/18 0001      Assessment   Medical Diagnosis  pain in L Hip, L knee and R knee    Referring Provider (PT)  Lanae Boast, FNP      Sensation   Additional Comments  occasional tingling in Lt foot      Strength   Overall Strength Comments  able to engage strength for kneel>stand transfer but some difficulty                   [facility] Adult PT Treatment/Exercise - 11/16/18 0001      Therapeutic Activites    Therapeutic Activities  ADL's    ADL's  floor<>stand, bed mobility & positioning      Knee/Hip  Exercises: Stretches   Piriformis Stretch  Both;30 seconds             PT Education - 11/16/18 0936    Education Details  pain=alarm, not always harm, reducing fear of pain    Person(s) Educated  Patient    Methods  Explanation;Demonstration;Tactile cues;Verbal cues    Comprehension  Verbalized understanding;Returned demonstration;Verbal cues required;Tactile cues required;Need further instruction       PT Short Term Goals - 10/19/18 1016      PT SHORT TERM GOAL #1   Title  pt will be independent in her HEP.     Status  Achieved        PT Long Term Goals - 10/19/18 1016      PT LONG TERM GOAL #1   Title  Pt will improve her SLS on the L LE to >/= 20 seconds.     Baseline  RT LE 30 sec, LLE 30 sec    Status  Achieved  PT LONG TERM GOAL #2   Title  pt will improve her L hip strength to >/= 4/5 in order to improve functional mobilty.     Baseline  Lt hip abd 4+/5, ext 4+/5 bilaterally , Rt hip abd 5/5    Status  Achieved      PT LONG TERM GOAL #3   Title  Pt will be able to amb a flight of stairs step over step with pain </= 3/10 using single hand rail.     Baseline  pain better , will cont to work on    Status  Partially [place] #4   Title  pt will be able to walk for 20 minutes with pain </= 3/10.     Status  Partially Met      PT LONG TERM GOAL #5   Title  Pt will be able to squat and get up from the floor with min to no difficulty.    Time  4    Period  Weeks    Status  New    Target Date  11/16/18            Plan - 11/16/18 6546    Clinical Impression Statement  Still having pain but is able to identify aggrivating factors in times of low pain. Practiced floor<>stand transfers to decrease pain and improve mechanics which pt was able to do with decreased pain. Also discussed ways to re-organize her closet to reduce need for bending down. Bed mobilty practiced and discussed use of pillows for support.    PT  Treatment/Interventions  ADLs/Self Care Home Management;Electrical Stimulation;Moist Heat;Cryotherapy;Ultrasound;Gait training;Stair training;Functional mobility training;Balance training;Therapeutic exercise;Therapeutic activities;Patient/family education;Manual techniques;Passive range of motion;Dry needling;Taping;Iontophoresis 72m/ml Dexamethasone    PT Next Visit Plan  how did floor<>stand go?  lunges, squats, ERO    PT Home Exercise Plan  Transverse abdominis contraction, hooklying ball squeeze, LTR, rt hip isometric flexion PRN. , piriformis , hip abd, hamstring and ITB, SLR/VMO, sit>stand with glut set    Consulted and Agree with Plan of Care  Patient       Patient will benefit from skilled therapeutic intervention in order to improve the following deficits and impairments:  Pain, Abnormal gait, Postural dysfunction, Decreased mobility, Decreased activity tolerance, Decreased range of motion, Decreased strength, Impaired flexibility, Decreased balance, Difficulty walking  Visit Diagnosis: Pain in left hip - Plan: PT plan of care cert/re-cert  Chronic pain of left knee - Plan: PT plan of care cert/re-cert  Muscle weakness (generalized) - Plan: PT plan of care cert/re-cert  Chronic pain of right knee - Plan: PT plan of care cert/re-cert  Difficulty in walking, not elsewhere classified - Plan: PT plan of care cert/re-cert     Problem List Patient Active Problem List   Diagnosis Date Noted  . Essential hypertension 06/25/2015  . Prediabetes 06/25/2015  . Sepsis ([place] 03/07/2015  . Leukocytosis 03/07/2015    Kara Mierzejewski C. Shabrea Weldin PT, DPT 11/16/18 9:44 AM   [facility]GCommerce City [place] [address]Phone: [phone]  Fax:  [phone] Name: Michaela TWERSKYMRN: [id number] of Birth: [birth year]

## 2018-11-23 ENCOUNTER — Encounter: Payer: Self-pay | Admitting: Physical Therapy

## 2018-11-23 ENCOUNTER — Other Ambulatory Visit: Payer: Self-pay

## 2018-11-23 ENCOUNTER — Ambulatory Visit: Payer: 59 | Admitting: Physical Therapy

## 2018-11-23 DIAGNOSIS — R262 Difficulty in walking, not elsewhere classified: Secondary | ICD-10-CM

## 2018-11-23 DIAGNOSIS — M6281 Muscle weakness (generalized): Secondary | ICD-10-CM

## 2018-11-23 DIAGNOSIS — M25552 Pain in left hip: Secondary | ICD-10-CM | POA: Diagnosis not present

## 2018-11-23 DIAGNOSIS — M25562 Pain in left knee: Secondary | ICD-10-CM

## 2018-11-23 DIAGNOSIS — G8929 Other chronic pain: Secondary | ICD-10-CM

## 2018-11-23 NOTE — Therapy (Signed)
[place], [name], [address] Phone: [phone]   Fax:  [phone]  Physical Therapy Treatment/RE-certification  Patient Details  Name: Michaela Wilson MRN: [id number] Date of Birth: Sep 14, 1965 Referring Provider (PT): Lanae Boast, FNP   Encounter Date: 11/23/2018  PT End of Session - 11/23/18 0847    Visit Number  26    Date for PT Re-Evaluation  01/07/19    PT Start Time  3833    PT Stop Time  0929    PT Time Calculation (min)  42 min       Past Medical History:  Diagnosis Date  . Anemia   . Cellulitis of right buttock 03/07/2015  . Hypertension   . Seasonal allergies   . Vertigo     Past Surgical History:  Procedure Laterality Date  . ABCESS DRAINAGE    . CHOLECYSTECTOMY    . TUBAL LIGATION      There were no vitals filed for this visit.  Subjective Assessment - 11/23/18 0849    Subjective  I was really sore but I walked out the kinks. No problems getting off of [place] floor[place]. I was hurting bad on Saturday because I was laying down almost all day- stretches and exercises were helpful.    How long can you sit comfortably?  was able on a log on the floor for 15-20 min yesterday    How long can you walk comfortably?  have not tried to push it    Patient Stated Goals  work without pain, walk longer distances, be able to shop without pain    Currently in Pain?  Yes    Pain Score  3     Pain Location  Hip    Pain Orientation  Left    Pain Descriptors / Indicators  Sore    Aggravating Factors   being still    Pain Relieving Factors  moving around but not too much    Pain Score  0    Pain Location  Knee    Pain Orientation  Right;Left    Pain Descriptors / Indicators  --   pop a lot   Aggravating Factors   getting off of floor - but getting better    Pain Relieving Factors  rest/light movement         [facility] PT Assessment - 11/23/18 0001       Assessment   Medical Diagnosis  pain in L Hip, L knee and R knee    Referring Provider (PT)  Lanae Boast, FNP      Strength   Right Hip Extension  5/5    Right Hip ABduction  4/5    Left Hip Flexion  4+/5    Left Hip Extension  5/5    Left Hip ABduction  4/5                           PT Education - 11/23/18 1710    Education Details  goals discussion, POC, HEP, see plan    Person(s) Educated  Patient    Methods  Explanation    Comprehension  Verbalized understanding       PT Short Term Goals - 10/19/18 1016      PT SHORT TERM GOAL #1   Title  pt will be independent in her HEP.     Status  Achieved        PT Long Term Goals - 11/23/18 0908      PT LONG TERM GOAL #1   Title  Pt will improve her SLS on the L LE to >/= 20 seconds.     Baseline  RT LE 30 sec, LLE 30 sec    Status  Achieved      PT LONG TERM GOAL #2   Title  pt will improve her L hip strength to >/= 4/5 in order to improve functional mobilty.     Baseline  Lt hip abd 4+/5, ext 4+/5 bilaterally , Rt hip abd 5/5    Status  Achieved      PT LONG TERM GOAL #3   Title  Pt will be able to amb a flight of stairs step over step with pain </= 3/10 using single hand rail.     Baseline  early part of the day, stairs are a breeze; I am now doing step-over-step each time; I use one hand rail, cannot run up stairs; My left side is about 5/10, Rt about 3/10    Status  Partially Met      PT LONG TERM GOAL #4   Title  pt will be able to walk for 20 minutes with pain </= 3/10.     Baseline  [place] and had some pain; walking around grocery store seems to be okay but waiting in line is difficult    Status  Partially Met      PT LONG TERM GOAL #5   Title  Pt will be able to squat and get up from the floor with min to no difficulty.    Baseline  mild difficulty, still have to use wall after standing for support    Status  On-going      Additional Long Term Goals    Additional Long Term Goals  Yes      PT LONG TERM GOAL #6   Title  Pt will return to exercise program without use of gym    Baseline  will progress and establish as appropriate, unable to reach a fat burn at limitations at this time    Status  New            Plan - 11/23/18 1711    Clinical Impression Statement  Pt is making significant progress toward functional goals and overall has been able to demo an improvement in functional tolerance to daily activities. At this time, pt will benefit from extension of POC in order to continue challenging endurance and get her back to a level where she can exercise to lose weight. Pt was frustrated that a lady much older than her could run but she cannot and we discussed gradual return to activity.    PT Frequency  1x / week    PT Duration  6 weeks    PT Treatment/Interventions  ADLs/Self Care Home Management;Electrical Stimulation;Moist Heat;Cryotherapy;Ultrasound;Gait training;Stair training;Functional mobility training;Balance training;Therapeutic exercise;Therapeutic activities;Patient/family education;Manual techniques;Passive range of motion;Dry needling;Taping;Iontophoresis 73m/ml Dexamethasone    PT Next Visit Plan  lunges, squats, eliptical    PT Home Exercise Plan  Transverse abdominis contraction, hooklying ball squeeze, LTR, rt hip isometric flexion PRN. , piriformis , hip abd, hamstring and ITB, SLR/VMO, sit>stand with glut set    Consulted and Agree with Plan of Care  Patient       Patient will benefit from skilled therapeutic intervention in order to improve the following  deficits and impairments:  Pain, Abnormal gait, Postural dysfunction, Decreased mobility, Decreased activity tolerance, Decreased range of motion, Decreased strength, Impaired flexibility, Decreased balance, Difficulty walking  Visit Diagnosis: Pain in left hip - Plan: PT plan of care cert/re-cert  Chronic pain of left knee - Plan: PT plan of care  cert/re-cert  Muscle weakness (generalized) - Plan: PT plan of care cert/re-cert  Chronic pain of right knee - Plan: PT plan of care cert/re-cert  Difficulty in walking, not elsewhere classified - Plan: PT plan of care cert/re-cert     Problem List Patient Active Problem List   Diagnosis Date Noted  . Essential hypertension 06/25/2015  . Prediabetes 06/25/2015  . Sepsis ([place]) 03/07/2015  . Leukocytosis 03/07/2015    Kirby Cortese C. Shailyn Weyandt PT, DPT 11/23/18 5:40 PM   [facility] [address] [place], [name], [address] Phone: [phone]   Fax:  [phone]  Name: Michaela Wilson MRN: [id number] Date of Birth: 1965-03-19

## 2018-11-24 ENCOUNTER — Encounter (HOSPITAL_COMMUNITY): Payer: Self-pay

## 2018-11-24 ENCOUNTER — Encounter (HOSPITAL_COMMUNITY): Payer: Self-pay | Admitting: *Deleted

## 2018-11-25 ENCOUNTER — Encounter: Payer: Self-pay | Admitting: Family Medicine

## 2018-11-25 ENCOUNTER — Ambulatory Visit (INDEPENDENT_AMBULATORY_CARE_PROVIDER_SITE_OTHER): Payer: 59 | Admitting: Family Medicine

## 2018-11-25 ENCOUNTER — Other Ambulatory Visit: Payer: Self-pay

## 2018-11-25 VITALS — BP 148/81 | HR 77 | Temp 98.7°F | Resp 16 | Ht 62.0 in | Wt 154.0 lb

## 2018-11-25 DIAGNOSIS — F4310 Post-traumatic stress disorder, unspecified: Secondary | ICD-10-CM

## 2018-11-25 MED ORDER — SERTRALINE HCL 25 MG PO TABS: 37.5000 mg | ORAL_TABLET | Freq: Every day | ORAL | 1 refills | Status: AC

## 2018-11-25 NOTE — Patient Instructions (Signed)
I recommend that you increase the Zoloft to 37.5 mg every day. That is 1 and 1/2 of a pill. Continue with stress management. Best of luck to you.     Managing Post-Traumatic Stress Disorder If you have been diagnosed with post-traumatic stress disorder (PTSD), you may be relieved that you now know why you have felt or behaved a certain way. Still, you may feel overwhelmed about the treatment ahead. You may also wonder how to get the support you need and how to deal with the condition day-to-day. If you are living with PTSD, there are ways to help you recover from it and manage your symptoms. How to manage lifestyle changes Managing stress Stress is your body's reaction to life changes and events, both good and bad. Stress can make PTSD worse. Take the following steps to manage stress:  Talk with your health care provider or a counselor if you would like to learn more about techniques to reduce your stress. He or she may suggest some stress reduction techniques such as: ? Muscle relaxation exercises. ? Regular exercise. ? Meditation, yoga, or other mind-body exercises. ? Breathing exercises. ? Listening to quiet music. ? Spending time outside.  Maintain a healthy lifestyle. Eat a healthy diet, exercise regularly, get plenty of sleep, and take time to relax.  Spend time with others. Talk with them about how you are feeling and what kind of support you need. Try not to isolate yourself, even though you may feel like doing that. Isolating yourself can delay your recovery.  Do activities and hobbies that you enjoy.  Pace yourself when doing stressful things. Take breaks, and reward yourself when you finish. Make sure that you do not overload your schedule.  Medicines Your health care provider may suggest certain medicines if he or she feels that they will help to improve your condition. Medicines for depression (antidepressants) or severe loss of contact with reality (antipsychotics) may be  used to treat PTSD. Avoid using alcohol and other substances that may prevent your medicines from working properly. It is also important to:  Talk with your pharmacist or health care provider about all medicines that you take, their possible side effects, and which medicines are safe to take together.  Make it your goal to take part in all treatment decisions (shared decision-making). Ask about possible side effects of medicines that your health care provider recommends, and tell him or her how you feel about having those side effects. It is best if shared decision-making with your health care provider is part of your total treatment plan. If your health care provider prescribes a medicine, you may not notice the full benefits of it for 4-8 weeks. Most people who are treated for PTSD need to take medicine for at least 6-12 months after they feel better. If you are taking medicines as part of your treatment, do not stop taking medicines before you ask your health care provider if it is safe to stop. You may need to have the medicine slowly decreased (tapered) over time to lower the risk of harmful side effects. Relationships Many people who have PTSD have difficulty trusting others. Make an effort to:  Take risks and develop trust with close friends and family members. Developing trust in others can help you feel safe and connect you with emotional support.  Be open and honest about your feelings.  Have fun and relax in safe spaces, such as with friends and family.  Think about going to couples counseling, family  education classes, or family therapy. Your loved ones may not always know how to be supportive. Therapy can be helpful for everyone. How to recognize changes in your condition Be aware of your symptoms and how often you have them. The following symptoms mean that you need to seek help for your PTSD:  You feel suspicious and angry.  You have repeated flashbacks.  You avoid going out or  being with others.  You have an increasing number of fights with close friends or family members, such as your spouse.  You have thoughts about hurting yourself or others.  You cannot get relief from feelings of depression or anxiety. Follow these instructions at home: Lifestyle  Exercise regularly. Try to do 30 or more minutes of physical activity on most days of the week.  Try to get 7-9 hours of sleep each night. To help with sleep: ? Keep your bedroom cool and dark. ? Avoid screen time before bedtime. This means avoiding use of your TV, computer, tablet, and cell phone.  Practice self-soothing skills and use them daily.  Try to have fun and seek humor in your life. Eating and drinking  Do not eat a heavy meal during the hour before you go to bed.  Do not drink alcohol or caffeinated drinks before bed.  Avoid using alcohol or drugs. General instructions  If your PTSD is affecting your marriage or family, seek help from a family therapist.  Take over-the-counter and prescription medicines only as told by your health care provider.  Make sure to let all of your health care providers know that you have PTSD. This is especially important if you are having surgery or need to be admitted to the hospital.  Keep all follow-up visits as told by your health care providers. This is important. Where to find support Talking to others  Explain that PTSD is a mental health problem. It is something that a person can develop after experiencing or seeing a life-threatening event. Tell them that PTSD makes you feel stress like you did during the event.  Talk to your loved ones about the symptoms you have. Also tell them what things or situations can cause symptoms to start (are triggers for you).  Assure your loved ones that there are treatments to help PTSD. Discuss possibly seeking family therapy or couples therapy.  If you are worried or fearful about seeking treatment, ask for  support.  Keep daily contact with at least one trusted friend or family member. Finances Not all insurance plans cover mental health care, so it is important to check with your insurance carrier. If paying for co-pays or counseling services is a problem, search for a local or county mental health care center. Public mental health care services may be offered there at a low cost or no cost when you are not able to see a private health care provider. If you are a veteran, contact a local veterans organization or veterans hospital for more information. If you are taking medicine for PTSD, you may be able to get the genericform, which may be less expensive than brand-name medicine. Some makers of prescription medicines also offer help to patients who cannot afford the medicines that they need. Therapy and support groups  Find a support group in your community. Often, groups are available for [employer] veterans, trauma victims, and family members or caregivers.  Look into volunteer opportunities. Taking part in these can help you feel more connected to your community.  Contact a local  organization to find out if you are eligible for a service dog. Where to find more information Go to this website to find more information about PTSD, treatment of PTSD, and how to get support:  [facility] for PTSD: www.ptsd.[employer]ballBu[employer].cz Contact a health care provider if:  Your symptoms get worse or do not get better. Get help right away if:  You have thoughts about hurting yourself or others. If you ever feel like you may hurt yourself or others, or have thoughts about taking your own life, get help right away. You can go to your nearest emergency department or call:  Your local emergency services (911 in the [place]  A suicide crisis helpline, such as the [place] at (743)748-9420. This is open 24-hours a day. Summary  If you are living with PTSD, there are ways to help you recover  from it and manage your symptoms.  Find supportive environments and people who understand PTSD. Spend time in those places, and maintain contact with those people.  Work with your health care team to create a plan for managing PTSD. The plan should include counseling, stress reduction techniques, and healthy lifestyle habits. This information is not intended to replace advice given to you by your health care provider. Make sure you discuss any questions you have with your health care provider. Document Released: 07/03/2016 Document Revised: 06/25/2018 Document Reviewed: 07/03/2016 Elsevier Patient Education  [employer] [employer].

## 2018-11-25 NOTE — Progress Notes (Signed)
  Patient [facility]   Progress Note: General Provider: Lanae Boast, FNP  SUBJECTIVE:   Michaela Wilson is a 53 y.o. female who  has a past medical history of Anemia, Cellulitis of right buttock (03/07/2015), Hypertension, Seasonal allergies, and Vertigo.. Patient presents today for Follow-up (3 week follow up )  Patient states that she is apprehensive about increasing the sertraline to 50mg . She is taking 25mg  and states that she feels some relief. She states that she has noticed that she is not on edge as much as she used to be. She is able to sleep without having to take any sleep aids. She denies psychosis, delusional thinking, homicidal/suicidal ideations, intent or plan.    Review of Systems  Constitutional: Negative.   HENT: Negative.   Eyes: Negative.   Respiratory: Negative.   Cardiovascular: Negative.   Gastrointestinal: Negative.   Genitourinary: Negative.   Musculoskeletal: Negative.   Skin: Negative.   Neurological: Negative.   Psychiatric/Behavioral: Negative.      OBJECTIVE: BP (!) 148/81 (BP Location: Left Arm, Patient Position: Sitting, Cuff Size: Normal)   Pulse 77   Temp 98.7 F (37.1 C) (Oral)   Resp 16   Ht 5\' 2"  (1.575 m)   Wt 154 lb (69.9 kg)   SpO2 99%   BMI 28.17 kg/m   Wt Readings from Last 3 Encounters:  11/25/18 154 lb (69.9 kg)  11/04/18 155 lb (70.3 kg)  09/12/18 150 lb (68 kg)     Physical Exam Vitals signs and nursing note reviewed.  Constitutional:      General: She is not in acute distress.    Appearance: Normal appearance.  HENT:     Head: Normocephalic and atraumatic.  Neurological:     Mental Status: She is alert.  Psychiatric:        Attention and Perception: Attention and perception normal.        Mood and Affect: Mood and affect normal.        Speech: Speech normal.        Behavior: Behavior normal. Behavior is cooperative.        Thought Content: Thought content normal.      Cognition and Memory: Cognition and memory normal.        Judgment: Judgment normal.     ASSESSMENT/PLAN:   1. PTSD (post-traumatic stress disorder) Discussed a trial of 37.5mg  of sertraline instead of 50mg . She is agreeable to this.  - sertraline (ZOLOFT) 25 MG tablet; Take 1.5 tablets (37.5 mg total) by mouth daily.  Dispense: 90 tablet; Refill: 1    Return in about 4 weeks (around 12/23/2018) for PTSD.    The patient was given clear instructions to go to ER or return to medical center if symptoms do not improve, worsen or new problems develop. The patient verbalized understanding and agreed with plan of care.   Ms. Doug Sou. Nathaneil Canary, FNP-BC [facility] [address] [place], [place] [address] [phone]

## 2018-11-29 ENCOUNTER — Encounter: Payer: Self-pay | Admitting: Physical Therapy

## 2018-11-29 ENCOUNTER — Other Ambulatory Visit: Payer: Self-pay

## 2018-11-29 ENCOUNTER — Ambulatory Visit: Payer: 59 | Admitting: Physical Therapy

## 2018-11-29 DIAGNOSIS — M25562 Pain in left knee: Secondary | ICD-10-CM

## 2018-11-29 DIAGNOSIS — M6281 Muscle weakness (generalized): Secondary | ICD-10-CM

## 2018-11-29 DIAGNOSIS — M25552 Pain in left hip: Secondary | ICD-10-CM | POA: Diagnosis not present

## 2018-11-29 DIAGNOSIS — G8929 Other chronic pain: Secondary | ICD-10-CM

## 2018-11-29 DIAGNOSIS — R262 Difficulty in walking, not elsewhere classified: Secondary | ICD-10-CM

## 2018-11-29 DIAGNOSIS — M25561 Pain in right knee: Secondary | ICD-10-CM

## 2018-11-29 NOTE — Therapy (Signed)
Michaela Wilson, [name], [address] Phone: [phone]   Fax:  [phone]  Physical Therapy Treatment  Patient Details  Name: Michaela Wilson MRN: [id number] Date of Birth: [birth year] Referring Provider (PT): Lanae Boast, FNP   Encounter Date: 11/29/2018  PT End of Session - 11/29/18 1021    Visit Number  27    Date for PT Re-Evaluation  01/07/19    Authorization Type  [employer]    PT Start Time  1018    PT Stop Time  1058    PT Time Calculation (min)  40 min    Activity Tolerance  Patient tolerated treatment well    Behavior During Therapy  [facility] for tasks assessed/performed       Past Medical History:  Diagnosis Date  . Anemia   . Cellulitis of right buttock 03/07/2015  . Hypertension   . Seasonal allergies   . Vertigo     Past Surgical History:  Procedure Laterality Date  . ABCESS DRAINAGE    . CHOLECYSTECTOMY    . TUBAL LIGATION      There were no vitals filed for this visit.  Subjective Assessment - 11/29/18 1020    Subjective  Got some new shoes. I am a litttle sore and began feeling a burn along Rt post/lat hip.    Patient Stated Goals  work without pain, walk longer distances, be able to shop without pain                       [facility] Adult PT Treatment/Exercise - 11/29/18 0001      Lumbar Exercises: Stretches   Passive Hamstring Stretch  Right;Left;2 reps;30 seconds    Passive Hamstring Stretch Limitations  supine with strap    Hip Flexor Stretch  Right;Left;30 seconds    Figure 4 Stretch  With overpressure;Supine;30 seconds    Gastroc Stretch  Right;Left;2 reps;30 seconds    Gastroc Stretch Limitations  1 gastroc 1 soleus      Lumbar Exercises: Aerobic   Elliptical  5 min L1 ramp 4      Lumbar Exercises: Standing   Functional Squats Limitations  pulling off from FM bar & squat with hip hinge    Other Standing Lumbar Exercises  hip abduction, hip extension with knee  flexed               PT Short Term Goals - 10/19/18 1016      PT SHORT TERM GOAL #1   Title  pt will be independent in her HEP.     Status  Achieved        PT Long Term Goals - 11/23/18 0908      PT LONG TERM GOAL #1   Title  Pt will improve her SLS on the L LE to >/= 20 seconds.     Baseline  RT LE 30 sec, LLE 30 sec    Status  Achieved      PT LONG TERM GOAL #2   Title  pt will improve her L hip strength to >/= 4/5 in order to improve functional mobilty.     Baseline  Lt hip abd 4+/5, ext 4+/5 bilaterally , Rt hip abd 5/5    Status  Achieved      PT LONG TERM GOAL #3   Title  Pt will be able to amb a flight of stairs step over step with pain </= 3/10 using single hand rail.  Baseline  early part of the day, stairs are a breeze; I am now doing step-over-step each time; I use one hand rail, cannot run up stairs; My left side is about 5/10, Rt about 3/10    Status  Partially Met      PT LONG TERM GOAL #4   Title  pt will be able to walk for 20 minutes with pain </= 3/10.     Baseline  [place] and had [place] pain; walking around grocery store seems to be okay but waiting in line is difficult    Status  Partially Met      PT LONG TERM GOAL #5   Title  Pt will be able to squat and get up from the floor with min to no difficulty.    Baseline  mild difficulty, still have to use wall after standing for support    Status  On-going      Additional Long Term Goals   Additional Long Term Goals  Yes      PT LONG TERM GOAL #6   Title  Pt will return to exercise program without use of gym    Baseline  will progress and establish as appropriate, unable to reach a fat burn at limitations at this time    Status  New            Plan - 11/29/18 1057    Clinical Impression Statement  Great tolerance to exercise today with new shoes & form during movements. Advised that she will likely be sore but to keep moving and stretching.    PT  Treatment/Interventions  ADLs/Self Care Home Management;Electrical Stimulation;Moist Heat;Cryotherapy;Ultrasound;Gait training;Stair training;Functional mobility training;Balance training;Therapeutic exercise;Therapeutic activities;Patient/family education;Manual techniques;Passive range of motion;Dry needling;Taping;Iontophoresis 76m/ml Dexamethasone    PT Next Visit Plan  lunges, review squats    PT Home Exercise Plan  Transverse abdominis contraction, LTR, piriformis , hip abd, hamstring and ITB, SLR/VMO, sit>stand with glut set, standing hip ext knee flexed & abduction    Consulted and Agree with Plan of Care  Patient       Patient will benefit from skilled therapeutic intervention in order to improve the following deficits and impairments:  Pain, Abnormal gait, Postural dysfunction, Decreased mobility, Decreased activity tolerance, Decreased range of motion, Decreased strength, Impaired flexibility, Decreased balance, Difficulty walking  Visit Diagnosis: Pain in left hip  Chronic pain of left knee  Chronic pain of right knee  Difficulty in walking, not elsewhere classified  Muscle weakness (generalized)     Problem List Patient Active Problem List   Diagnosis Date Noted  . Essential hypertension 06/25/2015  . Prediabetes 06/25/2015  . [place] 03[place]  . Leukocytosis 03/07/2015    Michaela Wilson C. Hye Trawick PT, DPT 11/29/18 1:50 PM   [facility] [address]Phone: [phone]  Fax:  [phone] Name: Michaela SACHSMRN: [id number] of Birth: [birth year]

## 2018-12-03 MED ORDER — SERTRALINE HCL 25 MG PO TABS: 25.0000 mg | ORAL_TABLET | Freq: Every day | ORAL | 0 refills | Status: DC

## 2018-12-09 ENCOUNTER — Ambulatory Visit: Payer: 59 | Admitting: Physical Therapy

## 2018-12-09 ENCOUNTER — Telehealth: Payer: Self-pay | Admitting: Physical Therapy

## 2018-12-09 NOTE — Telephone Encounter (Addendum)
Left voicemail regarding NS for appointment today. Requested pt call back. Jonathan Kirkendoll C. Britteney Ayotte PT, DPT 12/09/18 9:19 AM  Later notified by front desk- pt returned call stating she has a migraine but could not get through to let us know she was cancelling.  Carmell Elgin C. Ilanna Deihl PT, DPT 12/09/18 11:13 AM

## 2018-12-16 ENCOUNTER — Ambulatory Visit: Payer: 59 | Attending: Family Medicine | Admitting: Physical Therapy

## 2018-12-16 ENCOUNTER — Other Ambulatory Visit: Payer: Self-pay

## 2018-12-16 ENCOUNTER — Encounter: Payer: Self-pay | Admitting: Physical Therapy

## 2018-12-16 DIAGNOSIS — M25552 Pain in left hip: Secondary | ICD-10-CM | POA: Diagnosis not present

## 2018-12-16 DIAGNOSIS — M25551 Pain in right hip: Secondary | ICD-10-CM | POA: Insufficient documentation

## 2018-12-16 DIAGNOSIS — M25562 Pain in left knee: Secondary | ICD-10-CM | POA: Insufficient documentation

## 2018-12-16 DIAGNOSIS — R262 Difficulty in walking, not elsewhere classified: Secondary | ICD-10-CM | POA: Diagnosis present

## 2018-12-16 DIAGNOSIS — M6281 Muscle weakness (generalized): Secondary | ICD-10-CM | POA: Diagnosis present

## 2018-12-16 DIAGNOSIS — G8929 Other chronic pain: Secondary | ICD-10-CM | POA: Insufficient documentation

## 2018-12-16 DIAGNOSIS — M25561 Pain in right knee: Secondary | ICD-10-CM | POA: Diagnosis present

## 2018-12-16 NOTE — Therapy (Signed)
[place], [name], [address] Phone: [phone]   Fax:  [phone]  Physical Therapy Treatment  Patient Details  Name: Michaela Wilson MRN: [id number] Date of Birth: 1965-08-09 Referring Provider (PT): Lanae Boast, FNP   Encounter Date: 12/16/2018  PT End of Session - 12/16/18 0852    Visit Number  28    Date for PT Re-Evaluation  01/07/19    Authorization Type  [employer]    PT Start Time  0850   arrived late   PT Stop Time  0929    PT Time Calculation (min)  39 min    Activity Tolerance  Patient tolerated treatment well    Behavior During Therapy  [facility] for tasks assessed/performed       Past Medical History:  Diagnosis Date  . Anemia   . Cellulitis of right buttock 03/07/2015  . Hypertension   . Seasonal allergies   . Vertigo     Past Surgical History:  Procedure Laterality Date  . ABCESS DRAINAGE    . CHOLECYSTECTOMY    . TUBAL LIGATION      There were no vitals filed for this visit.  Subjective Assessment - 12/16/18 0852    Subjective  Yesterday I was about in tears, My Rt side is bothering me. the side of my Right hip and post leg and Lt knee hurt. I left work and did all of my exercises which did give me some relief. Still trying to get standing desk but work is making it difficult and others have been told to take their own home. I am going to buy a new chair.Standing hip abduction gives me the most relief.    Patient Stated Goals  work without pain, walk longer distances, be able to shop without pain    Currently in Pain?  Yes    Pain Score  6     Pain Location  Hip    Pain Orientation  Right    Pain Descriptors / Indicators  Burning                       [facility] Adult PT Treatment/Exercise - 12/16/18 0001      Lumbar Exercises: Aerobic   Elliptical  6 min L1 ramp 4      Knee/Hip Exercises: Stretches   Passive Hamstring Stretch Limitations  standing    Piriformis  Stretch Limitations  standing      Knee/Hip Exercises: Aerobic   Elliptical  5 min L1 ramp 4      Knee/Hip Exercises: Standing   Other Standing Knee Exercises  hip ext&ER    Other Standing Knee Exercises  hip hike from 2" step      Manual Therapy   Soft tissue mobilization  Rt gluts & piriformis             PT Education - 12/16/18 0930    Education Details  work Conservation officer, nature) Educated  Patient    Methods  Explanation    Comprehension  Verbalized understanding       PT Short Term Goals - 10/19/18 1016      PT SHORT TERM GOAL #1   Title  pt will be independent in her HEP.     Status  Achieved        PT Long Term Goals - 11/23/18 0908      PT LONG TERM GOAL #1   Title  Pt  will improve her SLS on the L LE to >/= 20 seconds.     Baseline  RT LE 30 sec, LLE 30 sec    Status  Achieved      PT LONG TERM GOAL #2   Title  pt will improve her L hip strength to >/= 4/5 in order to improve functional mobilty.     Baseline  Lt hip abd 4+/5, ext 4+/5 bilaterally , Rt hip abd 5/5    Status  Achieved      PT LONG TERM GOAL #3   Title  Pt will be able to amb a flight of stairs step over step with pain </= 3/10 using single hand rail.     Baseline  early part of the day, stairs are a breeze; I am now doing step-over-step each time; I use one hand rail, cannot run up stairs; My left side is about 5/10, Rt about 3/10    Status  Partially Met      PT LONG TERM GOAL #4   Title  pt will be able to walk for 20 minutes with pain </= 3/10.     Baseline  [place]; walking around grocery store seems to be okay but waiting in line is difficult    Status  Partially Met      PT LONG TERM GOAL #5   Title  Pt will be able to squat and get up from the floor with min to no difficulty.    Baseline  mild difficulty, still have to use wall after standing for support    Status  On-going      Additional Long Term Goals   Additional Long Term  Goals  Yes      PT LONG TERM GOAL #6   Title  Pt will return to exercise program without use of gym    Baseline  will progress and establish as appropriate, unable to reach a fat burn at limitations at this time    Status  New            Plan - 12/16/18 0929    Clinical Impression Statement  Pt reported feeling much better "I can get this day now"    trigger points in pirifomris and glut med created concordant pain and were reduced. Increased strengthening of abductors to reduce TFL use and educated on standing stretching so she does not have to sit on HS bursa while doing so.    PT Treatment/Interventions  ADLs/Self Care Home Management;Electrical Stimulation;Moist Heat;Cryotherapy;Ultrasound;Gait training;Stair training;Functional mobility training;Balance training;Therapeutic exercise;Therapeutic activities;Patient/family education;Manual techniques;Passive range of motion;Dry needling;Taping;Iontophoresis 24m/ml Dexamethasone    PT Home Exercise Plan  Transverse abdominis contraction, LTR, piriformis , hip abd, hamstring and ITB, SLR/VMO, sit>stand with glut set, standing hip ext knee flexed & abduction       Patient will benefit from skilled therapeutic intervention in order to improve the following deficits and impairments:  Pain, Abnormal gait, Postural dysfunction, Decreased mobility, Decreased activity tolerance, Decreased range of motion, Decreased strength, Impaired flexibility, Decreased balance, Difficulty walking  Visit Diagnosis: Pain in left hip  Chronic pain of left knee  Chronic pain of right knee  Difficulty in walking, not elsewhere classified  Muscle weakness (generalized)     Problem List Patient Active Problem List   Diagnosis Date Noted  . Essential hypertension 06/25/2015  . Prediabetes 06/25/2015  . Sepsis ([place] 03/07/2015  . Leukocytosis 03/07/2015   Cyncere Ruhe C. Annise Boran  PT, DPT 12/16/18 1:12 PM   [facility] [address] [place], [name], [address] Phone: [phone]   Fax:  [phone]  Name: Michaela Wilson MRN: [id number] Date of Birth: March 16, 1966

## 2018-12-21 ENCOUNTER — Ambulatory Visit: Payer: 59 | Admitting: Physical Therapy

## 2018-12-21 ENCOUNTER — Other Ambulatory Visit: Payer: Self-pay

## 2018-12-21 DIAGNOSIS — M6281 Muscle weakness (generalized): Secondary | ICD-10-CM

## 2018-12-21 DIAGNOSIS — M25552 Pain in left hip: Secondary | ICD-10-CM

## 2018-12-21 DIAGNOSIS — G8929 Other chronic pain: Secondary | ICD-10-CM

## 2018-12-21 DIAGNOSIS — R262 Difficulty in walking, not elsewhere classified: Secondary | ICD-10-CM

## 2018-12-21 NOTE — Therapy (Signed)
[place], [name], [address] Phone: [phone]   Fax:  [phone]  Physical Therapy Treatment  Patient Details  Name: Michaela Wilson MRN: [id number] Date of Birth: February 25, 1966 Referring Provider (PT): Lanae Boast, FNP   Encounter Date: 12/21/2018  PT End of Session - 12/21/18 0907    Visit Number  29    Date for PT Re-Evaluation  01/07/19    Authorization Type  UHC    PT Start Time  0900   pt arrived late   PT Stop Time  0929    PT Time Calculation (min)  29 min    Activity Tolerance  Patient tolerated treatment well    Behavior During Therapy  [facility] for tasks assessed/performed       Past Medical History:  Diagnosis Date  . Anemia   . Cellulitis of right buttock 03/07/2015  . Hypertension   . Seasonal allergies   . Vertigo     Past Surgical History:  Procedure Laterality Date  . ABCESS DRAINAGE    . CHOLECYSTECTOMY    . TUBAL LIGATION      There were no vitals filed for this visit.  Subjective Assessment - 12/21/18 0901    Subjective  A little sore on the Rt side today. Less pain in hamstrings, more focused in hip. No stretching done in the last 24 hrs. I think I found a chair that works for me.    Patient Stated Goals  work without pain, walk longer distances, be able to shop without pain    Currently in Pain?  Yes    Pain Score  6     Pain Location  Hip    Pain Orientation  Right;Left    Pain Descriptors / Indicators  Sore    Aggravating Factors   being still for long periods    Pain Relieving Factors  move around                       [facility] Adult PT Treatment/Exercise - 12/21/18 0001      Knee/Hip Exercises: Stretches   Gastroc Stretch  Both;30 seconds      Knee/Hip Exercises: Aerobic   Nustep  5 min L5 LE only      Knee/Hip Exercises: Standing   Heel Raises Limitations  isometric hold in mini squat    Side Lunges  Both;10 reps    Abduction Limitations   arcs with elbows on counter      Manual Therapy   Soft tissue mobilization  IASTM bil ITB, HS, gluts, piriformis, TFL               PT Short Term Goals - 10/19/18 1016      PT SHORT TERM GOAL #1   Title  pt will be independent in her HEP.     Status  Achieved        PT Long Term Goals - 11/23/18 0908      PT LONG TERM GOAL #1   Title  Pt will improve her SLS on the L LE to >/= 20 seconds.     Baseline  RT LE 30 sec, LLE 30 sec    Status  Achieved      PT LONG TERM GOAL #2   Title  pt will improve her L hip strength to >/= 4/5 in order to improve functional mobilty.     Baseline  Lt hip abd 4+/5, ext 4+/5  bilaterally , Rt hip abd 5/5    Status  Achieved      PT LONG TERM GOAL #3   Title  Pt will be able to amb a flight of stairs step over step with pain </= 3/10 using single hand rail.     Baseline  early part of the day, stairs are a breeze; I am now doing step-over-step each time; I use one hand rail, cannot run up stairs; My left side is about 5/10, Rt about 3/10    Status  Partially Met      PT LONG TERM GOAL #4   Title  pt will be able to walk for 20 minutes with pain </= 3/10.     Baseline  [place] and [place] pain; walking around grocery store seems to be okay but waiting in line is difficult    Status  Partially Met      PT LONG TERM GOAL #5   Title  Pt will be able to squat and get up from the floor with min to no difficulty.    Baseline  mild difficulty, still have to use wall after standing for support    Status  On-going      Additional Long Term Goals   Additional Long Term Goals  Yes      PT LONG TERM GOAL #6   Title  Pt will return to exercise program without use of gym    Baseline  will progress and establish as appropriate, unable to reach a fat burn at limitations at this time    Status  New            Plan - 12/21/18 0931    Clinical Impression Statement  Overslept today so she was late. Tightness noted in  bil ITB but able to reduce with activation of hip abductors. Encouraged her to find a stretching regimen that she can do daily and be more mobile over the weekend.    PT Treatment/Interventions  ADLs/Self Care Home Management;Electrical Stimulation;Moist Heat;Cryotherapy;Ultrasound;Gait training;Stair training;Functional mobility training;Balance training;Therapeutic exercise;Therapeutic activities;Patient/family education;Manual techniques;Passive range of motion;Dry needling;Taping;Iontophoresis 15m/ml Dexamethasone    PT Next Visit Plan  did she move over the weekend? wall sits    PT Home Exercise Plan  Transverse abdominis contraction, LTR, piriformis , hip abd, hamstring and ITB, SLR/VMO, sit>stand with glut set, standing hip ext knee flexed & abduction    Consulted and Agree with Plan of Care  Patient       Patient will benefit from skilled therapeutic intervention in order to improve the following deficits and impairments:  Pain, Abnormal gait, Postural dysfunction, Decreased mobility, Decreased activity tolerance, Decreased range of motion, Decreased strength, Impaired flexibility, Decreased balance, Difficulty walking  Visit Diagnosis: Pain in left hip  Chronic pain of left knee  Chronic pain of right knee  Difficulty in walking, not elsewhere classified  Muscle weakness (generalized)     Problem List Patient Active Problem List   Diagnosis Date Noted  . Essential hypertension 06/25/2015  . Prediabetes 06/25/2015  . Sepsis ([place] 03/07/2015  . Leukocytosis 03/07/2015    Fabrizio Filip C. Evangaline Jou PT, DPT 12/21/18 9:33 AM   [facility]GG[address] [place] [address]Phone: [phone]  Fax:  [phone] Name: Michaela GOGGINSMRN: [id number] of Birth: 111-Jun-1967

## 2018-12-23 ENCOUNTER — Ambulatory Visit: Payer: 59 | Admitting: Family Medicine

## 2018-12-28 ENCOUNTER — Other Ambulatory Visit: Payer: Self-pay

## 2018-12-28 ENCOUNTER — Encounter: Payer: Self-pay | Admitting: Physical Therapy

## 2018-12-28 ENCOUNTER — Encounter: Payer: Self-pay | Admitting: Family Medicine

## 2018-12-28 ENCOUNTER — Ambulatory Visit (INDEPENDENT_AMBULATORY_CARE_PROVIDER_SITE_OTHER): Payer: 59 | Admitting: Family Medicine

## 2018-12-28 ENCOUNTER — Ambulatory Visit: Payer: 59 | Admitting: Physical Therapy

## 2018-12-28 VITALS — BP 145/87 | HR 82 | Temp 98.7°F | Resp 14 | Ht 62.0 in | Wt 154.0 lb

## 2018-12-28 DIAGNOSIS — I10 Essential (primary) hypertension: Secondary | ICD-10-CM

## 2018-12-28 DIAGNOSIS — R262 Difficulty in walking, not elsewhere classified: Secondary | ICD-10-CM

## 2018-12-28 DIAGNOSIS — M6281 Muscle weakness (generalized): Secondary | ICD-10-CM

## 2018-12-28 DIAGNOSIS — M25551 Pain in right hip: Secondary | ICD-10-CM

## 2018-12-28 DIAGNOSIS — F4310 Post-traumatic stress disorder, unspecified: Secondary | ICD-10-CM

## 2018-12-28 DIAGNOSIS — M25552 Pain in left hip: Secondary | ICD-10-CM | POA: Diagnosis not present

## 2018-12-28 NOTE — Progress Notes (Signed)
Established Patient Office Visit  Subjective:  Patient ID: Michaela Wilson, female    DOB: [birth year]  Age: 53 y.o. MRN: [id number]  CC:  Chief Complaint  Patient presents with  . Follow-up    1 month follow up for ptsd. Taking zoloft   . Anxiety    HPI Michaela Wilson, a 53 year old female with a medical history significant for essential hypertension, generalized anxiety,chronic left hip pain undergoing PT,  and post traumatic stress disorder presents for a 1 month follow up for PTSD after increasing Zoloft.   Patient has a history of PTSD. 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 She says that she feels afraid all of the time and as if she is on edge. She says that prior to arrival, she "cussed out someone following her too closely in the parking lot". She says that she feels that she is losing control at times. She attributes some stress and anxiety to being the person in her family that everyone brings their problems to. She currently denies suicidal or homicidal ideations. She also denies visual or auditory hallucinations. She did not increase Zoloft to 50 mg daily.   Patient also following up for hypertension. She says that she has not been exercising as much since the weather has gotten cooler. She typically follows a low fat, low sodium diet. She denies headache, chest pain, heart palpitations, lower extremity swelling or tachypnea.    Past Medical History:  Diagnosis Date  . Anemia   . Cellulitis of right buttock 03/07/2015  . Hypertension   . Seasonal allergies   . Vertigo     Past Surgical History:  Procedure Laterality Date  . ABCESS DRAINAGE    . CHOLECYSTECTOMY    . TUBAL LIGATION      Family History  Problem Relation Age of Onset  . Lung cancer Maternal Grandfather   . Prostate cancer Paternal Grandfather   . Esophageal cancer Neg Hx   . Rectal cancer Neg Hx   . Stomach cancer Neg Hx     Social History   Socioeconomic History  .  Marital status: Divorced    Spouse name: Not on file  . Number of children: Not on file  . Years of education: Not on file  . Highest education level: Not on file  Occupational History  . Not on file  Social Needs  . Financial resource strain: Not on file  . Food insecurity    Worry: Not on file    Inability: Not on file  . Transportation needs    Medical: Not on file    Non-medical: Not on file  Tobacco Use  . Smoking status: Never Smoker  . Smokeless tobacco: Never Used  Substance and Sexual Activity  . Alcohol use: Yes    Alcohol/week: 0.0 standard drinks    Comment: socially  . Drug use: No  . Sexual activity: Not Currently  Lifestyle  . Physical activity    Days per week: Not on file    Minutes per session: Not on file  . Stress: Not on file  Relationships  . Social Herbalist on phone: Not on file    Gets together: Not on file    Attends religious service: Not on file    Active member of club or organization: Not on file    Attends meetings of clubs or organizations: Not on file    Relationship status: Not on  file  . Intimate partner violence    Fear of current or ex partner: Not on file    Emotionally abused: Not on file    Physically abused: Not on file    Forced sexual activity: Not on file  Other Topics Concern  . Not on file  Social History Narrative  . Not on file    Outpatient Medications Prior to Visit  Medication Sig Dispense Refill  . prazosin (MINIPRESS) 1 MG capsule Take 1 capsule (1 mg total) by mouth at bedtime. 30 capsule 2  . sertraline (ZOLOFT) 25 MG tablet Take 1.5 tablets (37.5 mg total) by mouth daily. 90 tablet 1  . sertraline (ZOLOFT) 25 MG tablet Take 1 tablet (25 mg total) by mouth daily. 25 mg po qd x 2 weeks then increase to 50 mg po qd 45 tablet 0   Facility-Administered Medications Prior to Visit  Medication Dose Route Frequency Provider Last Rate Last Dose  . 0.9 %  sodium chloride infusion  500 mL Intravenous Once  Nandigam, Kavitha V, MD        No Known Allergies  ROS Review of Systems  Constitutional: Negative.   HENT: Negative.   Eyes: Negative.   Respiratory: Negative.  Negative for shortness of breath.   Cardiovascular: Negative for palpitations and leg swelling.  Gastrointestinal: Negative.   Endocrine: Negative.   Genitourinary: Negative.   Neurological: Negative for dizziness.  Hematological: Negative.   Psychiatric/Behavioral: Positive for decreased concentration. Negative for behavioral problems, confusion and sleep disturbance. The patient is nervous/anxious.       Objective:    Physical Exam  Constitutional: She is oriented to person, place, and time. She appears well-developed.  HENT:  Head: Normocephalic and atraumatic.  Eyes: Pupils are equal, round, and reactive to light.  Neck: Normal range of motion.  Cardiovascular: Normal rate and regular rhythm.  Pulmonary/Chest: Effort normal and breath sounds normal.  Abdominal: Soft.  Musculoskeletal: Normal range of motion.  Neurological: She is alert and oriented to person, place, and time. She has normal reflexes.  Skin: Skin is warm and dry.  Psychiatric: She has a normal mood and affect. Her behavior is normal. Judgment and thought content normal.    BP (!) 145/87 (BP Location: Left Arm, Patient Position: Sitting, Cuff Size: Normal)   Pulse 82   Temp 98.7 F (37.1 C) (Oral)   Resp 14   Ht 5\' 2"  (1.575 m)   Wt 154 lb (69.9 kg)   SpO2 100%   BMI 28.17 kg/m  Wt Readings from Last 3 Encounters:  12/28/18 154 lb (69.9 kg)  11/25/18 154 lb (69.9 kg)  11/04/18 155 lb (70.3 kg)     Health Maintenance Due  Topic Date Due  . MAMMOGRAM  02/18/2017  . PAP SMEAR-Modifier  06/25/2018    There are no preventive care reminders to display for this patient.  Lab Results  Component Value Date   TSH 1.91 10/07/2016   Lab Results  Component Value Date   WBC 6.4 10/09/2016   HGB 13.6 10/09/2016   HCT 40.2 10/09/2016    MCV 85.2 10/09/2016   PLT 287 10/09/2016   Lab Results  Component Value Date   NA 144 04/26/2018   K 3.9 04/26/2018   CO2 24 04/26/2018   GLUCOSE 91 04/26/2018   BUN 8 04/26/2018   CREATININE 0.88 04/26/2018   BILITOT 0.3 04/26/2018   ALKPHOS 81 04/26/2018   AST 18 04/26/2018   ALT 18 04/26/2018  PROT 7.2 04/26/2018   ALBUMIN 4.0 04/26/2018   CALCIUM 9.4 04/26/2018   ANIONGAP 8 10/09/2016   Lab Results  Component Value Date   CHOL 202 (H) 10/23/2017   Lab Results  Component Value Date   HDL 58 10/23/2017   Lab Results  Component Value Date   LDLCALC 133 (H) 10/23/2017   Lab Results  Component Value Date   TRIG 57 10/23/2017   Lab Results  Component Value Date   CHOLHDL 3.4 05/09/2016   Lab Results  Component Value Date   HGBA1C 5.4 04/26/2018      Assessment & Plan:   Problem List Items Addressed This Visit      Cardiovascular and Mediastinum   Essential hypertension - Primary   Relevant Orders   Basic Metabolic Panel     Essential hypertension Continue medication, no changes warranted on today. Monitor blood pressure at home. Continue DASH diet. Reminder to go to the ER if any CP, SOB, nausea, dizziness, severe HA, changes vision/speech, left arm numbness and tingling and jaw pain.    - Basic Metabolic Panel  PTSD (post-traumatic stress disorder) Will continue Zoloft 25 mg daily. Ms. Glazer is hesitant about increasing dose at this point. She has had outbursts and rage over the past several months. She has not identified specific triggers for these behaviors. Patient will need tools to manage PTSD. Discussed psychology referral at length. Patient may benefit from this service.   - Ambulatory referral to Psychology   Follow-up: Return in about 3 months (around 03/30/2019) for hypertension.     Donia Pounds  APRN, MSN, FNP-C [facility] [place] [address] [place], [place] [address] [phone]

## 2018-12-28 NOTE — Patient Instructions (Signed)
Managing Post-Traumatic Stress Disorder If you have been diagnosed with post-traumatic stress disorder (PTSD), you may be relieved that you now know why you have felt or behaved a certain way. Still, you may feel overwhelmed about the treatment ahead. You may also wonder how to get the support you need and how to deal with the condition day-to-day. If you are living with PTSD, there are ways to help you recover from it and manage your symptoms. How to manage lifestyle changes Managing stress Stress is your body's reaction to life changes and events, both good and bad. Stress can make PTSD worse. Take the following steps to manage stress:  Talk with your health care provider or a counselor if you would like to learn more about techniques to reduce your stress. He or she may suggest some stress reduction techniques such as: ? Muscle relaxation exercises. ? Regular exercise. ? Meditation, yoga, or other mind-body exercises. ? Breathing exercises. ? Listening to quiet music. ? Spending time outside.  Maintain a healthy lifestyle. Eat a healthy diet, exercise regularly, get plenty of sleep, and take time to relax.  Spend time with others. Talk with them about how you are feeling and what kind of support you need. Try not to isolate yourself, even though you may feel like doing that. Isolating yourself can delay your recovery.  Do activities and hobbies that you enjoy.  Pace yourself when doing stressful things. Take breaks, and reward yourself when you finish. Make sure that you do not overload your schedule.  Medicines Your health care provider may suggest certain medicines if he or she feels that they will help to improve your condition. Medicines for depression (antidepressants) or severe loss of contact with reality (antipsychotics) may be used to treat PTSD. Avoid using alcohol and other substances that may prevent your medicines from working properly. It is also important to:  Talk with  your pharmacist or health care provider about all medicines that you take, their possible side effects, and which medicines are safe to take together.  Make it your goal to take part in all treatment decisions (shared decision-making). Ask about possible side effects of medicines that your health care provider recommends, and tell him or her how you feel about having those side effects. It is best if shared decision-making with your health care provider is part of your total treatment plan. If your health care provider prescribes a medicine, you may not notice the full benefits of it for 4-8 weeks. Most people who are treated for PTSD need to take medicine for at least 6-12 months after they feel better. If you are taking medicines as part of your treatment, do not stop taking medicines before you ask your health care provider if it is safe to stop. You may need to have the medicine slowly decreased (tapered) over time to lower the risk of harmful side effects. Relationships Many people who have PTSD have difficulty trusting others. Make an effort to:  Take risks and develop trust with close friends and family members. Developing trust in others can help you feel safe and connect you with emotional support.  Be open and honest about your feelings.  Have fun and relax in safe spaces, such as with friends and family.  Think about going to couples counseling, family education classes, or family therapy. Your loved ones may not always know how to be supportive. Therapy can be helpful for everyone. How to recognize changes in your condition Be aware of your   symptoms and how often you have them. The following symptoms mean that you need to seek help for your PTSD:  You feel suspicious and angry.  You have repeated flashbacks.  You avoid going out or being with others.  You have an increasing number of fights with close friends or family members, such as your spouse.  You have thoughts about  hurting yourself or others.  You cannot get relief from feelings of depression or anxiety. Follow these instructions at home: Lifestyle  Exercise regularly. Try to do 30 or more minutes of physical activity on most days of the week.  Try to get 7-9 hours of sleep each night. To help with sleep: ? Keep your bedroom cool and dark. ? Avoid screen time before bedtime. This means avoiding use of your TV, computer, tablet, and cell phone.  Practice self-soothing skills and use them daily.  Try to have fun and seek humor in your life. Eating and drinking  Do not eat a heavy meal during the hour before you go to bed.  Do not drink alcohol or caffeinated drinks before bed.  Avoid using alcohol or drugs. General instructions  If your PTSD is affecting your marriage or family, seek help from a family therapist.  Take over-the-counter and prescription medicines only as told by your health care provider.  Make sure to let all of your health care providers know that you have PTSD. This is especially important if you are having surgery or need to be admitted to the hospital.  Keep all follow-up visits as told by your health care providers. This is important. Where to find support Talking to others  Explain that PTSD is a mental health problem. It is something that a person can develop after experiencing or seeing a life-threatening event. Tell them that PTSD makes you feel stress like you did during the event.  Talk to your loved ones about the symptoms you have. Also tell them what things or situations can cause symptoms to start (are triggers for you).  Assure your loved ones that there are treatments to help PTSD. Discuss possibly seeking family therapy or couples therapy.  If you are worried or fearful about seeking treatment, ask for support.  Keep daily contact with at least one trusted friend or family member. Finances Not all insurance plans cover mental health care, so it is  important to check with your insurance carrier. If paying for co-pays or counseling services is a problem, search for a local or county mental health care center. Public mental health care services may be offered there at a low cost or no cost when you are not able to see a private health care provider. If you are a veteran, contact a local veterans organization or veterans hospital for more information. If you are taking medicine for PTSD, you may be able to get the genericform, which may be less expensive than brand-name medicine. Some makers of prescription medicines also offer help to patients who cannot afford the medicines that they need. Therapy and support groups  Find a support group in your community. Often, groups are available for military veterans, trauma victims, and family members or caregivers.  Look into volunteer opportunities. Taking part in these can help you feel more connected to your community.  Contact a local organization to find out if you are eligible for a service dog. Where to find more information Go to this website to find more information about PTSD, treatment of PTSD, and how to   get support:  National Center for PTSD: www.ptsd.va.gov Contact a health care provider if:  Your symptoms get worse or do not get better. Get help right away if:  You have thoughts about hurting yourself or others. If you ever feel like you may hurt yourself or others, or have thoughts about taking your own life, get help right away. You can go to your nearest emergency department or call:  Your local emergency services (911 in the [place]  A suicide crisis helpline, such as the National Suicide Prevention Lifeline at 1-800-273-8255. This is open 24-hours a day. Summary  If you are living with PTSD, there are ways to help you recover from it and manage your symptoms.  Find supportive environments and people who understand PTSD. Spend time in those places, and maintain contact with  those people.  Work with your health care team to create a plan for managing PTSD. The plan should include counseling, stress reduction techniques, and healthy lifestyle habits. This information is not intended to replace advice given to you by your health care provider. Make sure you discuss any questions you have with your health care provider. Document Released: 07/03/2016 Document Revised: 06/25/2018 Document Reviewed: 07/03/2016 Elsevier Patient Education  2020 Elsevier Inc.  

## 2018-12-28 NOTE — Therapy (Signed)
[place], [name], [address] Phone: [phone]   Fax:  [phone]  Physical Therapy Treatment  Patient Details  Name: Michaela Wilson MRN: [id number] Date of Birth: [birth year] Referring Provider (PT): Lanae Boast, FNP   Encounter Date: 12/28/2018  PT End of Session - 12/28/18 0858    Visit Number  30    Date for PT Re-Evaluation  01/07/19    Authorization Type  [employer]    PT Start Time  0845    PT Stop Time  0940    PT Time Calculation (min)  55 min    Activity Tolerance  Patient tolerated treatment well    Behavior During Therapy  [facility] for tasks assessed/performed       Past Medical History:  Diagnosis Date  . Anemia   . Cellulitis of right buttock 03/07/2015  . Hypertension   . Seasonal allergies   . Vertigo     Past Surgical History:  Procedure Laterality Date  . ABCESS DRAINAGE    . CHOLECYSTECTOMY    . TUBAL LIGATION      There were no vitals filed for this visit.  Subjective Assessment - 12/28/18 0855    Subjective  Pt arriving to therapy reporting 7/10 pain in low back on bilateral sides. Pt reporting yesterday her pain was unbearable.    Pertinent History  Pt with chronic pain in multiple sites: L hip, L knee, R knee, and Low Back, h/o vertigo    Limitations  Walking;Standing;Lifting;House hold activities    How long can you sit comfortably?  was able on a log on the floor for 15-20 min yesterday    How long can you stand comfortably?  15-20 min     How long can you walk comfortably?  have not tried to push it    Diagnostic tests  MRI performed    Patient Stated Goals  work without pain, walk longer distances, be able to shop without pain    Currently in Pain?  Yes    Pain Score  7     Pain Location  Back    Pain Orientation  Lower    Pain Descriptors / Indicators  Aching;Sore    Pain Type  Chronic pain    Pain Onset  More than a month ago    Pain Frequency  Intermittent    Multiple Pain Sites  Yes    Pain Score  7    Pain Location  Hip    Pain Orientation  Right    Pain Descriptors / Indicators  Aching;Sore    Pain Type  Chronic pain    Pain Onset  More than a month ago                       [facility] Adult PT Treatment/Exercise - 12/28/18 0001      Lumbar Exercises: Stretches   Active Hamstring Stretch  2 reps;30 seconds    Single Knee to Chest Stretch  2 reps;30 seconds    Piriformis Stretch  2 reps;20 seconds      Lumbar Exercises: Supine   Other Supine Lumbar Exercises  hip abduction/adduction isometrics holding 10 seconds      Knee/Hip Exercises: Stretches   Gastroc Stretch  Both;30 seconds      Knee/Hip Exercises: Aerobic   Nustep  5 min L5 LE only      Knee/Hip Exercises: Standing   Heel Raises Limitations  isometric hold  in mini squat      Moist Heat Therapy   Number Minutes Moist Heat  10 Minutes    Moist Heat Location  Lumbar Spine      Manual Therapy   Soft tissue mobilization  IASTM bil ITB, HS, gluts, piriformis, rolling bilateral hamstrings and IT bands               PT Short Term Goals - 12/28/18 0907      PT SHORT TERM GOAL #1   Title  pt will be independent in her HEP.     Time  3    Period  Weeks    Status  Achieved    Target Date  08/20/18        PT Long Term Goals - 12/28/18 0907      PT LONG TERM GOAL #1   Title  Pt will improve her SLS on the L LE to >/= 20 seconds.     Baseline  RT LE 30 sec, LLE 30 sec    Period  Weeks    Status  Achieved      PT LONG TERM GOAL #2   Title  pt will improve her L hip strength to >/= 4/5 in order to improve functional mobilty.     Baseline  Lt hip abd 4+/5, ext 4+/5 bilaterally , Rt hip abd 5/5    Period  Weeks    Status  Achieved      PT LONG TERM GOAL #3   Title  Pt will be able to amb a flight of stairs step over step with pain </= 3/10 using single hand rail.     Baseline  early part of the day, stairs are a breeze; I am now doing  step-over-step each time; I use one hand rail, cannot run up stairs; My left side is about 5/10, Rt about 3/10    Time  6    Period  Weeks    Status  Partially Met      PT LONG TERM GOAL #4   Title  pt will be able to walk for 20 minutes with pain </= 3/10.     Baseline  [place]; walking around grocery store seems to be okay but waiting in line is difficult    Time  6    Period  Weeks    Status  Partially Met      PT LONG TERM GOAL #5   Title  Pt will be able to squat and get up from the floor with min to no difficulty.    Baseline  mild difficulty, still have to use wall after standing for support    Period  Weeks    Status  On-going      PT LONG TERM GOAL #6   Title  Pt will return to exercise program without use of gym    Baseline  will progress and establish as appropriate, unable to reach a fat burn at limitations at this time    Status  New            Plan - 12/28/18 0903    Clinical Impression Statement  Pt arriving to therapy reporting severe pain of 10/10 yesterday in her low back. Pt pointing that the pain was at L5-S1. Pt reporting numbness and tingling down both legs. Pt also reported that the pain relieved after her husband massaged the area. Pt feels the dry needling  helped her Left hip and pt wants to DN her R hip next visit. Pt tolerating exercises and IASTM to low back, glutes, piriformis, lateral hip and It bands. Continue skilled PT.    Personal Factors and Comorbidities  Other    Examination-Activity Limitations  Squat;Stairs;Stand;Dressing;Sit    Examination-Participation Restrictions  Laundry;Other    Stability/Clinical Decision Making  Evolving/Moderate complexity    Rehab Potential  Good    PT Frequency  1x / week    PT Duration  6 weeks    PT Treatment/Interventions  ADLs/Self Care Home Management;Electrical Stimulation;Moist Heat;Cryotherapy;Ultrasound;Gait training;Stair training;Functional mobility  training;Balance training;Therapeutic exercise;Therapeutic activities;Patient/family education;Manual techniques;Passive range of motion;Dry needling;Taping;Iontophoresis 18m/ml Dexamethasone    PT Next Visit Plan  did she move over the weekend? wall sits    PT Home Exercise Plan  Transverse abdominis contraction, LTR, piriformis , hip abd, hamstring and ITB, SLR/VMO, sit>stand with glut set, standing hip ext knee flexed & abduction    Consulted and Agree with Plan of Care  Patient       Patient will benefit from skilled therapeutic intervention in order to improve the following deficits and impairments:  Pain, Abnormal gait, Postural dysfunction, Decreased mobility, Decreased activity tolerance, Decreased range of motion, Decreased strength, Impaired flexibility, Decreased balance, Difficulty walking  Visit Diagnosis: Pain in left hip  Pain in right hip  Difficulty in walking, not elsewhere classified  Muscle weakness (generalized)     Problem List Patient Active Problem List   Diagnosis Date Noted  . Essential hypertension 06/25/2015  . Prediabetes 06/25/2015  . Sepsis ([place] 03/07/2015  . Leukocytosis 03/07/2015    JOretha Caprice PT 12/28/2018, 9:35 AM  [facility]15[address]GBuck[address] [place] [address]Phone: [phone]  Fax:  [phone] Name: Michaela TEXIDORMRN: [id number] of Birth: [birth year]

## 2018-12-29 ENCOUNTER — Telehealth: Payer: Self-pay

## 2018-12-29 LAB — BASIC METABOLIC PANEL
BUN/Creatinine Ratio: 12 (ref 9–23)
BUN: 11 mg/dL (ref 6–24)
CO2: 24 mmol/L (ref 20–29)
Calcium: 9.6 mg/dL (ref 8.7–10.2)
Chloride: 106 mmol/L (ref 96–106)
Creatinine, Ser: 0.93 mg/dL (ref 0.57–1.00)
GFR calc Af Amer: 82 mL/min/{1.73_m2} (ref >59)
GFR calc non Af Amer: 71 mL/min/{1.73_m2} (ref >59)
Glucose: 106 mg/dL — ABNORMAL HIGH (ref 65–99)
Potassium: 4.1 mmol/L (ref 3.5–5.2)
Sodium: 143 mmol/L (ref 134–144)

## 2018-12-29 NOTE — Telephone Encounter (Signed)
-----   Message from Dorena Dew, [place] sent at 12/29/2018  6:26 AM EDT ----- Regarding: lab results Please inform patient that labs were within a normal range. No medication adjustments warranted at this time. -Continue medication, monitor blood pressure at home. Continue DASH diet. Reminder to go to the ER if any CP, SOB, nausea, dizziness, severe HA, changes vision/speech, left arm numbness and tingling and jaw pain.  Donia Pounds  APRN, MSN, FNP-C [facility] [address] [place], [place] [address] [phone]

## 2018-12-29 NOTE — Telephone Encounter (Signed)
Called and spoke with patient. Advised that labs are within normal limits and no changes to medications at this time. Advised to eat a DASH diet and monitor blood pressure at home. Reminded to go to ER for CP, SOB, nausea, dizziness, Sever HA, vision or speech changes, left arm numbness or tingling and jaw pain. Patient verbalized understanding. Thanks!

## 2019-01-04 ENCOUNTER — Other Ambulatory Visit: Payer: Self-pay

## 2019-01-04 ENCOUNTER — Ambulatory Visit: Payer: 59 | Admitting: Physical Therapy

## 2019-01-04 ENCOUNTER — Encounter: Payer: Self-pay | Admitting: Physical Therapy

## 2019-01-04 DIAGNOSIS — M25561 Pain in right knee: Secondary | ICD-10-CM

## 2019-01-04 DIAGNOSIS — M25552 Pain in left hip: Secondary | ICD-10-CM

## 2019-01-04 DIAGNOSIS — M25551 Pain in right hip: Secondary | ICD-10-CM

## 2019-01-04 DIAGNOSIS — G8929 Other chronic pain: Secondary | ICD-10-CM

## 2019-01-04 DIAGNOSIS — M6281 Muscle weakness (generalized): Secondary | ICD-10-CM

## 2019-01-04 DIAGNOSIS — R262 Difficulty in walking, not elsewhere classified: Secondary | ICD-10-CM

## 2019-01-04 NOTE — Therapy (Signed)
[place], [name], [address] Phone: [phone]   Fax:  [phone]  Physical Therapy Treatment/Discharge  Patient Details  Name: Michaela Wilson MRN: [id number] Date of Birth: 1965-10-29 Referring Provider (PT): Lanae Boast, FNP   Encounter Date: 01/04/2019  PT End of Session - 01/04/19 0930    Visit Number  31    Date for PT Re-Evaluation  01/07/19    Authorization Type  [employer]    PT Start Time  [phone]   pt arrived late   PT Stop Time  0930    PT Time Calculation (min)  39 min    Activity Tolerance  Patient tolerated treatment well    Behavior During Therapy  [facility] for tasks assessed/performed       Past Medical History:  Diagnosis Date  . Anemia   . Cellulitis of right buttock 03/07/2015  . Hypertension   . Seasonal allergies   . Vertigo     Past Surgical History:  Procedure Laterality Date  . ABCESS DRAINAGE    . CHOLECYSTECTOMY    . TUBAL LIGATION      There were no vitals filed for this visit.  Subjective Assessment - 01/04/19 0854    Subjective  I started massaging my Left leg which made it hurt but it did subside. Stood in line for 2 hours to vote, did exercises which helped. Walked around [employer] on Saturday, woke up on Sunday and I felt great.    Patient Stated Goals  work without pain, walk longer distances, be able to shop without pain         [facility] PT Assessment - 01/04/19 0001      Assessment   Medical Diagnosis  pain in L Hip, L knee and R knee    Referring Provider (PT)  Lanae Boast, FNP      Strength   Overall Strength Comments  gross 5/5                           PT Education - 01/04/19 1117    Education Details  see plan    Person(s) Educated  Patient    Methods  Explanation    Comprehension  Verbalized understanding       PT Short Term Goals - 12/28/18 0907      PT SHORT TERM GOAL #1   Title  pt will be independent in her HEP.     Time  3    Period  Weeks    Status  Achieved    Target Date  08/20/18        PT Long Term Goals - 01/04/19 0913      PT LONG TERM GOAL #1   Title  Pt will improve her SLS on the L LE to >/= 20 seconds.     Baseline  RT LE 30 sec, LLE 30 sec    Status  Achieved      PT LONG TERM GOAL #2   Title  pt will improve her L hip strength to >/= 4/5 in order to improve functional mobilty.     Status  Achieved      PT LONG TERM GOAL #3   Title  Pt will be able to amb a flight of stairs step over step with pain </= 3/10 using single hand rail.     Baseline  I am able to run up stairs    Status  Achieved      PT LONG TERM GOAL #4   Title  pt will be able to walk for 20 minutes with pain </= 3/10.     Status  Achieved      PT LONG TERM GOAL #5   Title  Pt will be able to squat and get up from the floor with min to no difficulty.    Status  Achieved      PT LONG TERM GOAL #6   Title  Pt will return to exercise program without use of gym    Baseline  able to work toward a good level for fat burn.    Status  Achieved            Plan - 01/04/19 1117    Clinical Impression Statement  Pt has met all of her goals at this time and feels that she is ready to be independent with her HEP. We discussed good/bad days and importance of daily HEP for a while. She will continue independently and was encouraged to contact us with any further questions.    PT Treatment/Interventions  ADLs/Self Care Home Management;Electrical Stimulation;Moist Heat;Cryotherapy;Ultrasound;Gait training;Stair training;Functional mobility training;Balance training;Therapeutic exercise;Therapeutic activities;Patient/family education;Manual techniques;Passive range of motion;Dry needling;Taping;Iontophoresis 22m/ml Dexamethasone    PT Home Exercise Plan  Transverse abdominis contraction, LTR, piriformis , hip abd, hamstring and ITB, SLR/VMO, sit>stand with glut set, standing hip ext knee flexed & abduction    Consulted and  Agree with Plan of Care  Patient       Patient will benefit from skilled therapeutic intervention in order to improve the following deficits and impairments:  Pain, Abnormal gait, Postural dysfunction, Decreased mobility, Decreased activity tolerance, Decreased range of motion, Decreased strength, Impaired flexibility, Decreased balance, Difficulty walking  Visit Diagnosis: Pain in left hip  Pain in right hip  Difficulty in walking, not elsewhere classified  Muscle weakness (generalized)  Chronic pain of left knee  Chronic pain of right knee     Problem List Patient Active Problem List   Diagnosis Date Noted  . Essential hypertension 06/25/2015  . Prediabetes 06/25/2015  . Sepsis ([place] 03/07/2015  . Leukocytosis 03/07/2015  PHYSICAL THERAPY DISCHARGE SUMMARY   Current functional level related to goals / functional outcomes: See above   Remaining deficits: See above   Education / Equipment: Anatomy of condition, POC, HEP, exercise form/rationale  Plan: Patient agrees to discharge.  Patient goals were not met. Patient is being discharged due to meeting the stated rehab goals.  ?????      Lunabella Badgett C. Jodiann Ognibene PT, DPT 01/04/19 11:19 AM   [facility]GBethes[address] [address]Phone: [phone]  Fax:  [phone] Name: Michaela PLEITEZMRN: [id number] of Birth: 1April 17, 1967

## 2019-01-14 ENCOUNTER — Ambulatory Visit: Payer: 59 | Admitting: Psychology

## 2019-01-19 ENCOUNTER — Ambulatory Visit (INDEPENDENT_AMBULATORY_CARE_PROVIDER_SITE_OTHER): Payer: 59 | Admitting: Psychology

## 2019-01-19 DIAGNOSIS — F4312 Post-traumatic stress disorder, chronic: Secondary | ICD-10-CM

## 2019-01-25 ENCOUNTER — Ambulatory Visit (INDEPENDENT_AMBULATORY_CARE_PROVIDER_SITE_OTHER): Payer: 59 | Admitting: Psychology

## 2019-01-25 DIAGNOSIS — F4312 Post-traumatic stress disorder, chronic: Secondary | ICD-10-CM | POA: Diagnosis not present

## 2019-02-08 ENCOUNTER — Ambulatory Visit (INDEPENDENT_AMBULATORY_CARE_PROVIDER_SITE_OTHER): Payer: 59 | Admitting: Psychology

## 2019-02-08 DIAGNOSIS — F4312 Post-traumatic stress disorder, chronic: Secondary | ICD-10-CM

## 2019-02-15 ENCOUNTER — Ambulatory Visit (INDEPENDENT_AMBULATORY_CARE_PROVIDER_SITE_OTHER): Payer: 59 | Admitting: Psychology

## 2019-02-15 DIAGNOSIS — F4312 Post-traumatic stress disorder, chronic: Secondary | ICD-10-CM | POA: Diagnosis not present

## 2019-03-01 ENCOUNTER — Ambulatory Visit (INDEPENDENT_AMBULATORY_CARE_PROVIDER_SITE_OTHER): Payer: 59 | Admitting: Psychology

## 2019-03-01 DIAGNOSIS — F4312 Post-traumatic stress disorder, chronic: Secondary | ICD-10-CM | POA: Diagnosis not present

## 2019-03-07 ENCOUNTER — Other Ambulatory Visit: Payer: Self-pay | Admitting: Family Medicine

## 2019-03-07 DIAGNOSIS — F4310 Post-traumatic stress disorder, unspecified: Secondary | ICD-10-CM

## 2019-03-16 ENCOUNTER — Ambulatory Visit (INDEPENDENT_AMBULATORY_CARE_PROVIDER_SITE_OTHER): Payer: 59 | Admitting: Psychology

## 2019-03-16 DIAGNOSIS — F4312 Post-traumatic stress disorder, chronic: Secondary | ICD-10-CM | POA: Diagnosis not present

## 2019-03-28 ENCOUNTER — Ambulatory Visit: Payer: 59 | Admitting: Psychology

## 2019-03-29 ENCOUNTER — Encounter: Payer: Self-pay | Admitting: Family Medicine

## 2019-03-29 ENCOUNTER — Other Ambulatory Visit: Payer: Self-pay

## 2019-03-29 ENCOUNTER — Ambulatory Visit (INDEPENDENT_AMBULATORY_CARE_PROVIDER_SITE_OTHER): Payer: 59 | Admitting: Family Medicine

## 2019-03-29 ENCOUNTER — Ambulatory Visit (HOSPITAL_COMMUNITY)
Admission: RE | Admit: 2019-03-29 | Discharge: 2019-03-29 | Disposition: A | Discharge status: Home / Self Care | Payer: 59 | Source: Ambulatory Visit | Attending: Family Medicine | Admitting: Family Medicine

## 2019-03-29 VITALS — BP 140/92 | HR 73 | Temp 98.0°F | Resp 14 | Ht 62.0 in | Wt 157.0 lb

## 2019-03-29 DIAGNOSIS — I10 Essential (primary) hypertension: Secondary | ICD-10-CM | POA: Diagnosis not present

## 2019-03-29 DIAGNOSIS — K219 Gastro-esophageal reflux disease without esophagitis: Secondary | ICD-10-CM

## 2019-03-29 DIAGNOSIS — R140 Abdominal distension (gaseous): Secondary | ICD-10-CM | POA: Diagnosis not present

## 2019-03-29 DIAGNOSIS — K581 Irritable bowel syndrome with constipation: Secondary | ICD-10-CM | POA: Diagnosis not present

## 2019-03-29 LAB — POCT URINALYSIS DIPSTICK
Bilirubin, UA: NEGATIVE
Glucose, UA: NEGATIVE
Ketones, UA: NEGATIVE
Leukocytes, UA: NEGATIVE
Nitrite, UA: NEGATIVE
Protein, UA: NEGATIVE
Spec Grav, UA: 1.025 (ref 1.010–1.025)
Urobilinogen, UA: 0.2 E.U./dL
pH, UA: 5.5 (ref 5.0–8.0)

## 2019-03-29 MED ORDER — LINACLOTIDE 145 MCG PO CAPS: 145.0000 ug | ORAL_CAPSULE | Freq: Every day | ORAL | 0 refills | Status: DC

## 2019-03-29 MED ORDER — LUBIPROSTONE 8 MCG PO CAPS: 8.0000 ug | ORAL_CAPSULE | Freq: Two times a day (BID) | ORAL | 1 refills | Status: DC

## 2019-03-29 MED ORDER — OMEPRAZOLE 20 MG PO CPDR: 20.0000 mg | DELAYED_RELEASE_CAPSULE | Freq: Every day | ORAL | 3 refills | Status: AC

## 2019-03-29 NOTE — Patient Instructions (Addendum)
Will start a 6 week trial of omeprazole 20 mg daily. Continue to eat clean and keep a food journal.  Irritable bowel syndrome: Amitiza daily.   Telephone visit in 6 weeks. Keep up the good work.      Food Choices for Gastroesophageal Reflux Disease, Adult When you have gastroesophageal reflux disease (GERD), the foods you eat and your eating habits are very important. Choosing the right foods can help ease your discomfort. Think about working with a nutrition specialist (dietitian) to help you make good choices. What are tips for following this plan?  Meals  Choose healthy foods that are low in fat, such as fruits, vegetables, whole grains, low-fat dairy products, and lean meat, fish, and poultry.  Eat small meals often instead of 3 large meals a day. Eat your meals slowly, and in a place where you are relaxed. Avoid bending over or lying down until 2-3 hours after eating.  Avoid eating meals 2-3 hours before bed.  Avoid drinking a lot of liquid with meals.  Cook foods using methods other than frying. Bake, grill, or broil food instead.  Avoid or limit: ? Chocolate. ? Peppermint or spearmint. ? Alcohol. ? Pepper. ? Black and decaffeinated coffee. ? Black and decaffeinated tea. ? Bubbly (carbonated) soft drinks. ? Caffeinated energy drinks and soft drinks.  Limit high-fat foods such as: ? Fatty meat or fried foods. ? Whole milk, cream, butter, or ice cream. ? Nuts and nut butters. ? Pastries, donuts, and sweets made with butter or shortening.  Avoid foods that cause symptoms. These foods may be different for everyone. Common foods that cause symptoms include: ? Tomatoes. ? Oranges, lemons, and limes. ? Peppers. ? Spicy food. ? Onions and garlic. ? Vinegar. Lifestyle  Maintain a healthy weight. Ask your doctor what weight is healthy for you. If you need to lose weight, work with your doctor to do so safely.  Exercise for at least 30 minutes for 5 or more days each  week, or as told by your doctor.  Wear loose-fitting clothes.  Do not smoke. If you need help quitting, ask your doctor.  Sleep with the head of your bed higher than your feet. Use a wedge under the mattress or blocks under the bed frame to raise the head of the bed. Summary  When you have gastroesophageal reflux disease (GERD), food and lifestyle choices are very important in easing your symptoms.  Eat small meals often instead of 3 large meals a day. Eat your meals slowly, and in a place where you are relaxed.  Limit high-fat foods such as fatty meat or fried foods.  Avoid bending over or lying down until 2-3 hours after eating.  Avoid peppermint and spearmint, caffeine, alcohol, and chocolate. This information is not intended to replace advice given to you by your health care provider. Make sure you discuss any questions you have with your health care provider. Document Revised: 06/24/2018 Document Reviewed: 04/08/2016 Elsevier Patient Education  [place]. Omeprazole tablets (OTC) What is this medicine? OMEPRAZOLE (oh ME pray zol) prevents the production of acid in the stomach. It is used to treat the symptoms of heartburn. You can buy this medicine without a prescription. This product is not for long-term use, unless otherwise directed by your doctor or health care professional. This medicine may be used for other purposes; ask your health care provider or pharmacist if you have questions. COMMON BRAND NAME(S): Prilosec OTC What should I tell my health care provider before  I take this medicine? They need to know if you have any of these conditions:  black or bloody stools  chest pain  difficulty swallowing  have had heartburn for over 3 months  have heartburn with dizziness, lightheadedness or sweating  liver disease  lupus  stomach pain  unexplained weight loss  vomiting with blood  wheezing  an unusual or allergic reaction to omeprazole, other  medicines, foods, dyes, or preservatives  pregnant or trying to get pregnant  breast-feeding How should I use this medicine? Take this medicine by mouth with a glass of water. Follow the directions on the product label. Do not cut, crush or chew this medicine. Swallow the tablets whole. Take this medicine on an empty stomach, at least 30 minutes before breakfast. Take your medicine at regular intervals. Do not take it more often than directed. Talk to your pediatrician regarding the use of this medicine in children. Special care may be needed. Overdosage: If you think you have taken too much of this medicine contact a poison control center or emergency room at once. NOTE: This medicine is only for you. Do not share this medicine with others. What if I miss a dose? If you miss a dose, take it as soon as you can. If it is almost time for your next dose, take only that dose. Do not take double or extra doses. What may interact with this medicine? Do not take this medicine with any of the following medications:  atazanavir  clopidogrel  nelfinavir  rilpivirine This medicine may also interact with the following medications:  antifungals like itraconazole, ketoconazole, and voriconazole  certain antivirals for HIV or hepatitis  certain medicines that treat or prevent blood clots like warfarin  cilostazol  citalopram  cyclosporine  dasatinib  digoxin  disulfiram  diuretics  erlotinib  iron supplements  medicines for anxiety, panic, and sleep like diazepam  medicines for seizures like carbamazepine, phenobarbital, phenytoin  methotrexate  mycophenolate mofetil  nilotinib  rifampin  St. John's wort  tacrolimus  vitamin B12 This list may not describe all possible interactions. Give your health care provider a list of all the medicines, herbs, non-prescription drugs, or dietary supplements you use. Also tell them if you smoke, drink alcohol, or use illegal drugs.  Some items may interact with your medicine. What should I watch for while using this medicine? It can take several days before your heartburn gets better. Tell your healthcare professional if your symptoms do not start to get better or if they get worse. If you need to take this medicine for more than 14 days, talk to your healthcare professional. Heartburn may sometimes be caused by a more serious condition. This medicine may cause a decrease in vitamin B12. You should make sure that you get enough vitamin B12 while you are taking this medicine. Discuss the foods you eat and the vitamins you take with your health care professional. What side effects may I notice from receiving this medicine? Side effects that you should report to your doctor or health care professional as soon as possible:  allergic reactions like skin rash, itching or hives, swelling of the face, lips, or tongue  bone pain  breathing problems  fever or sore throat  joint pain  rash on cheeks or arms that gets worse in the sun  redness, blistering, peeling, or loosening of the skin, including inside the mouth  severe diarrhea  signs and symptoms of kidney injury like trouble passing urine or change in  the amount of urine  signs and symptoms of low magnesium like muscle cramps; muscle pain; muscle weakness; tremors; seizures; or fast, irregular heartbeat  stomach polyps  unusual bleeding or bruising Side effects that usually do not require medical attention (report to your doctor or health care professional if they continue or are bothersome):  diarrhea  dry mouth  gas  headache  nausea  stomach pain This list may not describe all possible side effects. Call your doctor for medical advice about side effects. You may report side effects to FDA at 1-800-FDA-1088. Where should I keep my medicine? Keep out of the reach of children. Store at room temperature between 20 and 25 degrees C (68 and 77 degrees F).  Protect from light and moisture. Throw away any unused medicine after the expiration date. NOTE: This sheet is a summary. It may not cover all possible information. If you have questions about this medicine, talk to your doctor, pharmacist, or health care provider.  2020 Elsevier/Gold Standard (2017-12-23 13:06:30) Linaclotide oral capsules What is this medicine? LINACLOTIDE (lin a KLOE tide) is used to treat irritable bowel syndrome (IBS) with constipation as the main problem. It may also be used for relief of chronic constipation. This medicine may be used for other purposes; ask your health care provider or pharmacist if you have questions. COMMON BRAND NAME(S): Linzess What should I tell my health care provider before I take this medicine? They need to know if you have any of these conditions:  history of stool (fecal) impaction  now have diarrhea or have diarrhea often  other medical condition  stomach or intestinal disease, including bowel obstruction or abdominal adhesions  an unusual or allergic reaction to linaclotide, other medicines, foods, dyes, or preservatives  pregnant or trying to get pregnant  breast-feeding How should I use this medicine? Take this medicine by mouth with a glass of water. Follow the directions on the prescription label. Do not cut, crush or chew this medicine. Take on an empty stomach, at least 30 minutes before your first meal of the day. Take your medicine at regular intervals. Do not take your medicine more often than directed. Do not stop taking except on your doctor's advice. A special MedGuide will be given to you by the pharmacist with each prescription and refill. Be sure to read this information carefully each time. Talk to your pediatrician regarding the use of this medicine in children. This medicine is not approved for use in children. Overdosage: If you think you have taken too much of this medicine contact a poison control center or  emergency room at once. NOTE: This medicine is only for you. Do not share this medicine with others. What if I miss a dose? If you miss a dose, just skip that dose. Wait until your next dose, and take only that dose. Do not take double or extra doses. What may interact with this medicine?  certain medicines for bowel problems or bladder incontinence (these can cause constipation) This list may not describe all possible interactions. Give your health care provider a list of all the medicines, herbs, non-prescription drugs, or dietary supplements you use. Also tell them if you smoke, drink alcohol, or use illegal drugs. Some items may interact with your medicine. What should I watch for while using this medicine? Visit your doctor for regular check ups. Tell your doctor if your symptoms do not get better or if they get worse. Diarrhea is a common side effect of this medicine.  It often begins within 2 weeks of starting this medicine. Stop taking this medicine and call your doctor if you get severe diarrhea. Stop taking this medicine and call your doctor or go to the nearest hospital emergency room right away if you develop unusual or severe stomach-area (abdominal) pain, especially if you also have bright red, bloody stools or black stools that look like tar. What side effects may I notice from receiving this medicine? Side effects that you should report to your doctor or health care professional as soon as possible:  allergic reactions like skin rash, itching or hives, swelling of the face, lips, or tongue  black, tarry stools  bloody or watery diarrhea  new or worsening stomach pain  severe or prolonged diarrhea Side effects that usually do not require medical attention (report to your doctor or health care professional if they continue or are bothersome):  bloating  gas  loose stools This list may not describe all possible side effects. Call your doctor for medical advice about side  effects. You may report side effects to FDA at 1-800-FDA-1088. Where should I keep my medicine? Keep out of the reach of children. Store at room temperature between 20 and 25 degrees C (68 and 77 degrees F). Keep this medicine in the original container. Keep tightly closed in a dry place. Do not remove the desiccant packet from the bottle, it helps to protect your medicine from moisture. Throw away any unused medicine after the expiration date. NOTE: This sheet is a summary. It may not cover all possible information. If you have questions about this medicine, talk to your doctor, pharmacist, or health care provider.  2020 Elsevier/Gold Standard (2015-04-05 12:17:04)

## 2019-03-29 NOTE — Progress Notes (Signed)
[name]   Established Patient Office Visit  Subjective:  Patient ID: Michaela Wilson, female    DOB: April 22, 1965  Age: 54 y.o. MRN: [id number]  CC:  Chief Complaint  Patient presents with  . Hypertension  . Abdominal Pain    cramping, bloating, constipated. Had symptoms a while but getting worse.     Michaela Wilson, a 54 year old female with a medical history significant for hypertension and PTSD presents for a follow up of hypertension and with complaints of abdominal bloating, cramping, and intermittent constipation. Patient also endorses some heartburn.   Hypertension This is a chronic problem. The current episode started more than 1 year ago. The problem has been gradually improving since onset. The problem is controlled. Pertinent negatives include no anxiety, blurred vision, headaches, malaise/fatigue, neck pain, orthopnea, palpitations, peripheral edema, PND, shortness of breath or sweats. There are no associated agents to hypertension. There are no known risk factors for coronary artery disease. There are no compliance problems.   Abdominal Pain This is a recurrent problem. The current episode started more than 1 month ago. The onset quality is gradual. The problem occurs intermittently. The problem has been waxing and waning. The pain is located in the epigastric region. The quality of the pain is aching, a sensation of fullness and cramping. Associated symptoms include constipation. Pertinent negatives include no diarrhea, headaches or nausea. The pain is aggravated by eating. The pain is relieved by passing flatus. She has tried antacids for the symptoms. The treatment provided no relief. Her past medical history is significant for GERD and irritable bowel syndrome. There is no history of abdominal surgery, colon cancer, Crohn's disease, gallstones, pancreatitis, PUD or ulcerative colitis.     Past Medical History:  Diagnosis Date   . Anemia   . Cellulitis of right buttock 03/07/2015  . Hypertension   . Seasonal allergies   . Vertigo     Past Surgical History:  Procedure Laterality Date  . ABCESS DRAINAGE    . CHOLECYSTECTOMY    . TUBAL LIGATION      Family History  Problem Relation Age of Onset  . Lung cancer Maternal Grandfather   . Prostate cancer Paternal Grandfather   . Esophageal cancer Neg Hx   . Rectal cancer Neg Hx   . Stomach cancer Neg Hx     Social History   Socioeconomic History  . Marital status: Divorced    Spouse name: Not on file  . Number of children: Not on file  . Years of education: Not on file  . Highest education level: Not on file  Occupational History  . Not on file  Tobacco Use  . Smoking status: Never Smoker  . Smokeless tobacco: Never Used  Substance and Sexual Activity  . Alcohol use: Yes    Alcohol/week: 0.0 standard drinks    Comment: socially  . Drug use: No  . Sexual activity: Not Currently  Other Topics Concern  . Not on file  Social History Narrative  . Not on file   Social Determinants of Health   Financial Resource Strain:   . Difficulty of Paying Living Expenses: Not on file  Food Insecurity:   . Worried About Charity fundraiser in the Last Year: Not on file  . Ran Out of Food in the Last Year: Not on file  Transportation Needs:   . Lack of Transportation (Medical): Not on file  . Lack of Transportation (  Non-Medical): Not on file  Physical Activity:   . Days of Exercise per Week: Not on file  . Minutes of Exercise per Session: Not on file  Stress:   . Feeling of Stress : Not on file  Social Connections:   . Frequency of Communication with Friends and Family: Not on file  . Frequency of Social Gatherings with Friends and Family: Not on file  . Attends Religious Services: Not on file  . Active Member of Clubs or Organizations: Not on file  . Attends Archivist Meetings: Not on file  . Marital Status: Not on file  Intimate  Partner Violence:   . Fear of Current or Ex-Partner: Not on file  . Emotionally Abused: Not on file  . Physically Abused: Not on file  . Sexually Abused: Not on file    Outpatient Medications Prior to Visit  Medication Sig Dispense Refill  . sertraline (ZOLOFT) 25 MG tablet Take 1.5 tablets (37.5 mg total) by mouth daily. 90 tablet 1  . prazosin (MINIPRESS) 1 MG capsule TAKE 1 CAPSULE (1 MG TOTAL) BY MOUTH AT BEDTIME. (Patient not taking: Reported on 03/29/2019) 30 capsule 2   Facility-Administered Medications Prior to Visit  Medication Dose Route Frequency Provider Last Rate Last Admin  . 0.9 %  sodium chloride infusion  500 mL Intravenous Once Nandigam, Kavitha V, MD        No Known Allergies  ROS Review of Systems  Constitutional: Negative.  Negative for malaise/fatigue.  HENT: Negative.   Eyes: Negative.  Negative for blurred vision.  Respiratory: Negative for shortness of breath.   Cardiovascular: Negative for palpitations, orthopnea and PND.  Gastrointestinal: Positive for abdominal pain and constipation. Negative for blood in stool, diarrhea and nausea.  Endocrine: Negative.  Negative for polydipsia, polyphagia and polyuria.  Genitourinary: Negative.   Musculoskeletal: Negative.  Negative for neck pain.  Allergic/Immunologic: Negative.   Neurological: Negative for headaches.  Hematological: Negative.   Psychiatric/Behavioral: Negative.       Objective:    Physical Exam  Constitutional: She is oriented to person, place, and time. She appears well-developed and well-nourished.  HENT:  Head: Normocephalic.  Eyes: Pupils are equal, round, and reactive to light.  Cardiovascular: Normal rate and regular rhythm.  Pulmonary/Chest: Effort normal and breath sounds normal.  Abdominal: Soft. Bowel sounds are normal. She exhibits distension.  Musculoskeletal:        General: Normal range of motion.     Cervical back: Normal range of motion.  Neurological: She is alert and  oriented to person, place, and time.  Skin: Skin is warm and dry.  Psychiatric: She has a normal mood and affect. Her behavior is normal. Judgment and thought content normal.    BP (!) 140/92 (BP Location: Right Arm, Patient Position: Sitting) Comment: manually  Pulse 73   Temp 98 F (36.7 C) (Oral)   Resp 14   Ht 5\' 2"  (1.575 m)   Wt 157 lb (71.2 kg)   SpO2 100%   BMI 28.72 kg/m  Wt Readings from Last 3 Encounters:  03/29/19 157 lb (71.2 kg)  12/28/18 154 lb (69.9 kg)  11/25/18 154 lb (69.9 kg)     Health Maintenance Due  Topic Date Due  . MAMMOGRAM  02/18/2017  . PAP SMEAR-Modifier  06/25/2018    There are no preventive care reminders to display for this patient.  Lab Results  Component Value Date   TSH 1.91 10/07/2016   Lab Results  Component Value  Date   WBC 6.4 10/09/2016   HGB 13.6 10/09/2016   HCT 40.2 10/09/2016   MCV 85.2 10/09/2016   PLT 287 10/09/2016   Lab Results  Component Value Date   NA 143 12/28/2018   K 4.1 12/28/2018   CO2 24 12/28/2018   GLUCOSE 106 (H) 12/28/2018   BUN 11 12/28/2018   CREATININE 0.93 12/28/2018   BILITOT 0.3 04/26/2018   ALKPHOS 81 04/26/2018   AST 18 04/26/2018   ALT 18 04/26/2018   PROT 7.2 04/26/2018   ALBUMIN 4.0 04/26/2018   CALCIUM 9.6 12/28/2018   ANIONGAP 8 10/09/2016   Lab Results  Component Value Date   CHOL 202 (H) 10/23/2017   Lab Results  Component Value Date   HDL 58 10/23/2017   Lab Results  Component Value Date   LDLCALC 133 (H) 10/23/2017   Lab Results  Component Value Date   TRIG 57 10/23/2017   Lab Results  Component Value Date   CHOLHDL 3.4 05/09/2016   Lab Results  Component Value Date   HGBA1C 5.4 04/26/2018      Assessment & Plan:   Problem List Items Addressed This Visit      Cardiovascular and Mediastinum   Essential hypertension - Primary   Relevant Orders   Urinalysis Dipstick    Other Visit Diagnoses    Gastroesophageal reflux disease without esophagitis        Relevant Medications   omeprazole (PRILOSEC) 20 MG capsule   linaclotide (LINZESS) 145 MCG CAPS capsule   lubiprostone (AMITIZA) 8 MCG capsule   Irritable bowel syndrome with constipation       Relevant Medications   omeprazole (PRILOSEC) 20 MG capsule   linaclotide (LINZESS) 145 MCG CAPS capsule   lubiprostone (AMITIZA) 8 MCG capsule   Abdominal bloating       Relevant Orders   DG Abd 1 View      Meds ordered this encounter  Medications  . omeprazole (PRILOSEC) 20 MG capsule    Sig: Take 1 capsule (20 mg total) by mouth daily.    Dispense:  30 capsule    Refill:  3    Order Specific Question:   Supervising Provider    Answer:   Tresa Garter [id number]  . linaclotide (LINZESS) 145 MCG CAPS capsule    Sig: Take 1 capsule (145 mcg total) by mouth daily before breakfast.    Dispense:  30 capsule    Refill:  0    Order Specific Question:   Supervising Provider    Answer:   Tresa Garter [id number]  . lubiprostone (AMITIZA) 8 MCG capsule    Sig: Take 1 capsule (8 mcg total) by mouth 2 (two) times daily with a meal.    Dispense:  30 capsule    Refill:  1    Order Specific Question:   Supervising Provider    Answer:   Tresa Garter [id number]    Essential hypertension Blood pressure controlled on current medication regimen. No changes warranted.  - Urinalysis Dipstick  Gastroesophageal reflux disease without esophagitis Start a trial of omeprazole. Also, discussed diet at length. Patient is following a low carb diet that is managed by an app. Advised to identify foods that trigger acid reflux.  - omeprazole (PRILOSEC) 20 MG capsule; Take 1 capsule (20 mg total) by mouth daily.  Dispense: 30 capsule; Refill: 3   Irritable bowel syndrome with constipation - lubiprostone (AMITIZA) 8 MCG capsule; Take 1 capsule (8 mcg total) by  mouth 2 (two) times daily with a meal.  Dispense: 30 capsule; Refill: 1  Abdominal bloating Will review results as they become  available.  - DG Abd 1 View; Future    Follow-up: Return in about 6 weeks (around 05/10/2019).   Donia Pounds  APRN, MSN, FNP-C [facility] [address] [place], [place] [address] [phone]

## 2019-03-30 ENCOUNTER — Other Ambulatory Visit: Payer: Self-pay | Admitting: Family Medicine

## 2019-03-30 DIAGNOSIS — K581 Irritable bowel syndrome with constipation: Secondary | ICD-10-CM

## 2019-03-31 ENCOUNTER — Telehealth: Payer: Self-pay

## 2019-03-31 ENCOUNTER — Other Ambulatory Visit: Payer: Self-pay | Admitting: Family Medicine

## 2019-03-31 NOTE — Telephone Encounter (Signed)
Pharmacy asking for refills and quantity. Please advise.

## 2019-03-31 NOTE — Telephone Encounter (Signed)
Called and started a Prior authorization for Amitiza 59mcg with insurance. I have faxed over office notes for additional information. It is pending review. Reference # [id number]. Insurance to fax [place] and mail letter to patient with final decision.

## 2019-04-01 ENCOUNTER — Telehealth: Payer: Self-pay

## 2019-04-01 NOTE — Telephone Encounter (Signed)
Called patient's insurance and changed request for Amitiza to expedited request. Spoke with rep named "WILLA" Michela Pitcher it would be 3 business days. Thanks!

## 2019-04-06 ENCOUNTER — Ambulatory Visit (INDEPENDENT_AMBULATORY_CARE_PROVIDER_SITE_OTHER): Payer: 59 | Admitting: Psychology

## 2019-04-06 DIAGNOSIS — F4312 Post-traumatic stress disorder, chronic: Secondary | ICD-10-CM

## 2019-04-20 ENCOUNTER — Ambulatory Visit (INDEPENDENT_AMBULATORY_CARE_PROVIDER_SITE_OTHER): Payer: 59 | Admitting: Psychology

## 2019-04-20 DIAGNOSIS — F4312 Post-traumatic stress disorder, chronic: Secondary | ICD-10-CM

## 2019-04-28 ENCOUNTER — Ambulatory Visit (INDEPENDENT_AMBULATORY_CARE_PROVIDER_SITE_OTHER): Payer: 59 | Admitting: Psychology

## 2019-04-28 DIAGNOSIS — F4312 Post-traumatic stress disorder, chronic: Secondary | ICD-10-CM | POA: Diagnosis not present

## 2019-05-11 ENCOUNTER — Ambulatory Visit (INDEPENDENT_AMBULATORY_CARE_PROVIDER_SITE_OTHER): Payer: 59 | Admitting: Psychology

## 2019-05-11 DIAGNOSIS — F4312 Post-traumatic stress disorder, chronic: Secondary | ICD-10-CM

## 2019-05-26 ENCOUNTER — Ambulatory Visit: Payer: 59 | Admitting: Psychology

## 2019-06-01 ENCOUNTER — Ambulatory Visit: Payer: 59 | Admitting: Psychology

## 2019-06-14 ENCOUNTER — Ambulatory Visit (INDEPENDENT_AMBULATORY_CARE_PROVIDER_SITE_OTHER): Payer: 59 | Admitting: Psychology

## 2019-06-14 DIAGNOSIS — F4312 Post-traumatic stress disorder, chronic: Secondary | ICD-10-CM

## 2019-06-27 ENCOUNTER — Ambulatory Visit (INDEPENDENT_AMBULATORY_CARE_PROVIDER_SITE_OTHER): Payer: 59 | Admitting: Psychology

## 2019-06-27 DIAGNOSIS — F4312 Post-traumatic stress disorder, chronic: Secondary | ICD-10-CM

## 2019-06-28 ENCOUNTER — Encounter: Payer: Self-pay | Admitting: Family Medicine

## 2019-06-28 ENCOUNTER — Ambulatory Visit (INDEPENDENT_AMBULATORY_CARE_PROVIDER_SITE_OTHER): Payer: 59 | Admitting: Family Medicine

## 2019-06-28 DIAGNOSIS — K581 Irritable bowel syndrome with constipation: Secondary | ICD-10-CM

## 2019-06-28 DIAGNOSIS — K219 Gastro-esophageal reflux disease without esophagitis: Secondary | ICD-10-CM

## 2019-06-28 NOTE — Progress Notes (Signed)
Patient [facility]  Virtual Visit via Telephone Note  I connected with Michaela Wilson on 07/07/19 at  9:40 AM EDT by telephone and verified that I am speaking with the correct person using two identifiers.   I discussed the limitations, risks, security and privacy concerns of performing an evaluation and management service by telephone and the availability of in person appointments. I also discussed with the patient that there may be a patient responsible charge related to this service. The patient expressed understanding and agreed to proceed.    Follow Up Instructions:    I discussed the assessment and treatment plan with the patient. The patient was provided an opportunity to ask questions and all were answered. The patient agreed with the plan and demonstrated an understanding of the instructions.   The patient was advised to call back or seek an in-person evaluation if the symptoms worsen or if the condition fails to improve as anticipated.  I provided 10 minutes of non-face-to-face time during this encounter.    Subjective:  Patient ID: Michaela Wilson, female    DOB: [birth year]  Age: 54 y.o. MRN: [id number]  CC:  Chief Complaint  Patient presents with  . Follow-up  . Gastroesophageal Reflux    omeprazole   . Constipation    trying fiber pills.     HPI Michaela Wilson presents via telephone for a follow-up of irritable bowel syndrome.  Patient was evaluated in clinic previously for increased cramping, bloating, and constipation.  Patient was prescribed Linzess, however insurance would not cover medication.  She is also followed by the [facility] who prescribed a fiber supplement.  She has been taking fiber supplement consistently.  Patient says that constipation has decreased.  She does endorse some bloating.  Bloating occurs periodically.  She denies headache, dizziness, chest pain, abdominal pain, vomiting, diarrhea, or constipation.  Past  Medical History:  Diagnosis Date  . Anemia   . Cellulitis of right buttock 03/07/2015  . Hypertension   . Seasonal allergies   . Vertigo     Past Surgical History:  Procedure Laterality Date  . ABCESS DRAINAGE    . CHOLECYSTECTOMY    . TUBAL LIGATION      Family History  Problem Relation Age of Onset  . Lung cancer Maternal Grandfather   . Prostate cancer Paternal Grandfather   . Esophageal cancer Neg Hx   . Rectal cancer Neg Hx   . Stomach cancer Neg Hx     Social History   Socioeconomic History  . Marital status: Divorced    Spouse name: Not on file  . Number of children: Not on file  . Years of education: Not on file  . Highest education level: Not on file  Occupational History  . Not on file  Tobacco Use  . Smoking status: Never Smoker  . Smokeless tobacco: Never Used  Substance and Sexual Activity  . Alcohol use: Yes    Alcohol/week: 0.0 standard drinks    Comment: socially  . Drug use: No  . Sexual activity: Not Currently  Other Topics Concern  . Not on file  Social History Narrative  . Not on file   Social Determinants of Health   Financial Resource Strain:   . Difficulty of Paying Living Expenses:   Food Insecurity:   . Worried About Charity fundraiser in the Last Year:   . Arboriculturist in the Last Year:   [employer]  Needs:   . Lack of Transportation (Medical):   Marland Kitchen Lack of Transportation (Non-Medical):   Physical Activity:   . Days of Exercise per Week:   . Minutes of Exercise per Session:   Stress:   . Feeling of Stress :   Social Connections:   . Frequency of Communication with Friends and Family:   . Frequency of Social Gatherings with Friends and Family:   . Attends Religious Services:   . Active Member of Clubs or Organizations:   . Attends Archivist Meetings:   Marland Kitchen Marital Status:   Intimate Partner Violence:   . Fear of Current or Ex-Partner:   . Emotionally Abused:   Marland Kitchen Physically Abused:   . Sexually Abused:       Outpatient Medications Prior to Visit  Medication Sig Dispense Refill  . meclizine (ANTIVERT) 25 MG tablet Take 25 mg by mouth 3 (three) times daily as needed for dizziness.    Marland Kitchen omeprazole (PRILOSEC) 20 MG capsule Take 1 capsule (20 mg total) by mouth daily. 30 capsule 3  . sertraline (ZOLOFT) 25 MG tablet Take 1.5 tablets (37.5 mg total) by mouth daily. 90 tablet 1  . lubiprostone (AMITIZA) 8 MCG capsule Take 1 capsule (8 mcg total) by mouth 2 (two) times daily with a meal. (Patient not taking: Reported on 06/28/2019) 30 capsule 1  . prazosin (MINIPRESS) 1 MG capsule TAKE 1 CAPSULE (1 MG TOTAL) BY MOUTH AT BEDTIME. (Patient not taking: Reported on 03/29/2019) 30 capsule 2   Facility-Administered Medications Prior to Visit  Medication Dose Route Frequency Provider Last Rate Last Admin  . 0.9 %  sodium chloride infusion  500 mL Intravenous Once Nandigam, Kavitha V, MD        No Known Allergies  ROS Review of Systems  Constitutional: Negative.   HENT: Negative.   Respiratory: Negative.   Cardiovascular: Negative.   Gastrointestinal: Negative.   Endocrine: Negative for polydipsia, polyphagia and polyuria.  Musculoskeletal: Negative.  Negative for myalgias and neck pain.  Skin: Negative.   Neurological: Negative.   Hematological: Negative.   Psychiatric/Behavioral: Negative.       Objective:     There were no vitals taken for this visit. Wt Readings from Last 3 Encounters:  03/29/19 157 lb (71.2 kg)  12/28/18 154 lb (69.9 kg)  11/25/18 154 lb (69.9 kg)     Health Maintenance Due  Topic Date Due  . HIV Screening  Never done  . MAMMOGRAM  Never done  . PAP SMEAR-Modifier  06/25/2018    There are no preventive care reminders to display for this patient.  Lab Results  Component Value Date   TSH 1.91 10/07/2016   Lab Results  Component Value Date   WBC 6.4 10/09/2016   HGB 13.6 10/09/2016   HCT 40.2 10/09/2016   MCV 85.2 10/09/2016   PLT 287 10/09/2016   Lab  Results  Component Value Date   NA 143 12/28/2018   K 4.1 12/28/2018   CO2 24 12/28/2018   GLUCOSE 106 (H) 12/28/2018   BUN 11 12/28/2018   CREATININE 0.93 12/28/2018   BILITOT 0.3 04/26/2018   ALKPHOS 81 04/26/2018   AST 18 04/26/2018   ALT 18 04/26/2018   PROT 7.2 04/26/2018   ALBUMIN 4.0 04/26/2018   CALCIUM 9.6 12/28/2018   ANIONGAP 8 10/09/2016   Lab Results  Component Value Date   CHOL 202 (H) 10/23/2017   Lab Results  Component Value Date   HDL 58 10/23/2017  Lab Results  Component Value Date   LDLCALC 133 (H) 10/23/2017   Lab Results  Component Value Date   TRIG 57 10/23/2017   Lab Results  Component Value Date   CHOLHDL 3.4 05/09/2016   Lab Results  Component Value Date   HGBA1C 5.4 04/26/2018       Assessment & Plan:    Irritable bowel syndrome with constipation Continue to modify diet as discussed.  Will reorder Linzess.  Also, continue fiber supplement as needed.  Gastroesophageal reflux disease without esophagitis  The patient is asked to make an attempt to improve diet and exercise patterns to aid in medical management of this problem.    Follow-up:  3 months for hypertension    Donia Pounds  APRN, MSN, FNP-C [facility] [address] [place], [place] [address] [phone]    I connected with  Michaela Wilson on 06/28/19 by phone and verified that I am speaking with the correct person using two identifiers.   I discussed the limitations of evaluation and management by telemedicine. The patient expressed understanding and agreed to proceed.

## 2019-07-11 ENCOUNTER — Ambulatory Visit: Payer: 59 | Admitting: Nurse Practitioner

## 2019-07-15 ENCOUNTER — Ambulatory Visit (INDEPENDENT_AMBULATORY_CARE_PROVIDER_SITE_OTHER): Payer: 59 | Admitting: Psychology

## 2019-07-15 DIAGNOSIS — F4312 Post-traumatic stress disorder, chronic: Secondary | ICD-10-CM

## 2019-07-18 ENCOUNTER — Other Ambulatory Visit: Payer: Self-pay | Admitting: Family Medicine

## 2019-07-18 DIAGNOSIS — Z1231 Encounter for screening mammogram for malignant neoplasm of breast: Secondary | ICD-10-CM

## 2019-07-25 ENCOUNTER — Ambulatory Visit (INDEPENDENT_AMBULATORY_CARE_PROVIDER_SITE_OTHER): Payer: 59 | Admitting: Psychology

## 2019-07-25 DIAGNOSIS — F4312 Post-traumatic stress disorder, chronic: Secondary | ICD-10-CM

## 2019-07-26 NOTE — Progress Notes (Signed)
07/26/2019 GENTRY MLAKAR [id number] 19-May-1965    Chief Complaint: Constipation   History of Present Illness: Michaela Wilson is a 54 year old female with past medical history of hypertension, GERD, constipation and colon polyps. Past cholecystectomy. She was referred to our office by her PCP regarding constipation. She is passing a BM with straining once every 3 days. She tries to have a BM daily morning.  She often feels stool in the rectum but cannot go.  She dislikes having a bowel movement when she gets to work.  She is taking Fibercon twice daily but felt a bit more constipated so she reduced to 1 tab daily.  She drinks a cup of coffee which sometimes results in passing a BM.  He drinks about 32 ounces of water daily. She is taking Iaso cleanse tea once weekly for the past 2 weeks which results in passing a solid bowel movement followed by 2-3 looser stools.  No rectal bleeding or melena.  Will then have a fairly consistent bowel movement for the next 2 to 3 days then back to being constipated.  Occasionally has diarrhea.  Yesterday she was eating a [place] subsandwich while at work and had urgent explosive diarrhea x1.  She complains of having abdominal bloat for which she started Omeprazole 20 mg p.o. daily as directed by her PCP approximately 1 month ago.  She denies having any dysphagia or heartburn.  She has occasional epigastric discomfort unrelated to eating.  Her abdominal bloat and epigastric discomfort have lessened since starting the omeprazole.  Infrequent NSAID use.  She reported having a CBC, CMP, TSH and vitamin D done by the [facility] in [place], I will request a copy of these results for further review.  She underwent a colonoscopy by Dr. Silverio Decamp 06/19/2017 which identified 1 mm hyperplastic polyp to the sigmoid colon, mild diverticulosis was noted in the sigmoid and ascending colon.  Nonbleeding internal hemorrhoids were noted.  She was advised to repeat a colonoscopy in 10 years.   Her blood pressure is elevated today 140/100.  She stated her blood pressure is typically higher when she goes to the doctor's office.  However, she is having occasional headaches with seeing black spots/floaters at times.  No other complaints today.   Current Outpatient Medications on File Prior to Visit  Medication Sig Dispense Refill  . meclizine (ANTIVERT) 25 MG tablet Take 25 mg by mouth 3 (three) times daily as needed for dizziness.    Marland Kitchen omeprazole (PRILOSEC) 20 MG capsule Take 1 capsule (20 mg total) by mouth daily. 30 capsule 3  . polycarbophil (FIBERCON) 625 MG tablet Take 625 mg by mouth daily.    . sertraline (ZOLOFT) 25 MG tablet Take 1.5 tablets (37.5 mg total) by mouth daily. 90 tablet 1  . Vitamin D, Ergocalciferol, (DRISDOL) 1.25 MG (50000 UNIT) CAPS capsule Take 50,000 Units by mouth every 7 (seven) days.     Current Facility-Administered Medications on File Prior to Visit  Medication Dose Route Frequency Provider Last Rate Last Admin  . 0.9 %  sodium chloride infusion  500 mL Intravenous Once Nandigam, Venia Minks, MD        No Known Allergies   Current Medications, Allergies, Past Medical History, Past Surgical History, Family History and Social History were reviewed in [employer] record.   Physical Exam: BP (!) 140/100   Pulse 68   Temp 98.1 F (36.7 C)   Ht 5\' 2"  (1.575 m)  Wt 154 lb (69.9 kg)   BMI 28.17 kg/m   General: Well developed 54 year old female in no acute distress. Head: Normocephalic and atraumatic. Eyes: No scleral icterus. Conjunctiva pink . Ears: Normal auditory acuity. Mouth: Dentition intact. No ulcers or lesions.  Lungs: Clear throughout to auscultation. Heart: Regular rate and rhythm, no murmur. Abdomen: Soft, nondistended. Mild epigastric and central lower abdominal tenderness. No masses or hepatomegaly. Normal bowel sounds x 4 quadrants.  Rectal: Deferred  Musculoskeletal: Symmetrical with no gross  deformities. Extremities: No edema. Neurological: Alert oriented x 4. No focal deficits.  Psychological: Alert and cooperative. Normal mood and affect  Assessment and Recommendations:  86.  54 year old female with constipation and abdominal bloat -MiraLAX 1 capful mixed in 8 ounces of water at bedtime. -FiberCon 1 tab daily if tolerated,if abdominal bloat worsened -Increase water intake to 64 ounces daily -Discussed trial with Linzess if no improvement with the above regimen -May use glycerin suppositories as needed  -Request copy of CBC, CMP, TSH and vitamin D from PCPs office done at the [facility] in [place]   2.  Epigastric pain, proving on Omeprazole. -Continue omeprazole 20 mg once daily as patient's epigastric discomfort and abdominal bloat have improved on this medication -Follow up in office with Dr. Silverio Decamp 3 months, consider EGD if epigastric discomfort persists -Patient to call our office if her symptoms worsen  3.  Hyperplastic colon polyp. -Next colonoscopy due 06/2027  4.  Hypertension with complaints of headaches and black floaters -Advised patient to follow-up with her PCP regarding her elevated blood pressure, headaches and seeing black spots/floaters

## 2019-07-27 ENCOUNTER — Encounter: Payer: Self-pay | Admitting: Nurse Practitioner

## 2019-07-27 ENCOUNTER — Other Ambulatory Visit: Payer: Self-pay

## 2019-07-27 ENCOUNTER — Ambulatory Visit (INDEPENDENT_AMBULATORY_CARE_PROVIDER_SITE_OTHER): Payer: 59 | Admitting: Nurse Practitioner

## 2019-07-27 VITALS — BP 140/100 | HR 68 | Temp 98.1°F | Ht 62.0 in | Wt 154.0 lb

## 2019-07-27 DIAGNOSIS — K219 Gastro-esophageal reflux disease without esophagitis: Secondary | ICD-10-CM

## 2019-07-27 DIAGNOSIS — K5900 Constipation, unspecified: Secondary | ICD-10-CM

## 2019-07-27 DIAGNOSIS — R1013 Epigastric pain: Secondary | ICD-10-CM | POA: Diagnosis not present

## 2019-07-27 NOTE — Patient Instructions (Signed)
If you are age 53 or older, your body mass index should be between 23-30. Your Body mass index is 28.17 kg/m. If this is out of the aforementioned range listed, please consider follow up with your Primary Care Provider.  If you are age 20 or younger, your body mass index should be between 19-25. Your Body mass index is 28.17 kg/m. If this is out of the aformentioned range listed, please consider follow up with your Primary Care Provider.   Take Miralax 1 capful mixed in 8 ounces of water at bed time for constipation as tolerated.  Continue taking you omeprzole 20 mg daily  We have requested labwork from your last visit at the [facility] with Dr Rockwell Germany.  Follow up with Dr Silverio Decamp in 3 months. Please call to schedule this office visit in 10/2019. [phone].  Due to recent changes in healthcare laws, you may see the results of your imaging and laboratory studies on MyChart before your provider has had a chance to review them.  We understand that in some cases there may be results that are confusing or concerning to you. Not all laboratory results come back in the same time frame and the provider may be waiting for multiple results in order to interpret others.  Please give [place] 48 hours in order for your provider to thoroughly review all the results before contacting the office for clarification of your results.   Thank you for choosing [facility] Noralyn Pick, CRNP

## 2019-08-05 NOTE — Progress Notes (Signed)
Reviewed and agree with documentation and assessment and plan. K. Veena Cebastian Neis , MD   

## 2019-08-09 ENCOUNTER — Ambulatory Visit: Payer: Self-pay | Admitting: Psychology

## 2019-09-21 ENCOUNTER — Ambulatory Visit: Payer: 59 | Admitting: Family Medicine

## 2019-09-21 ENCOUNTER — Other Ambulatory Visit: Payer: Self-pay | Admitting: Family Medicine

## 2019-09-21 ENCOUNTER — Ambulatory Visit: Payer: Self-pay | Admitting: Nurse Practitioner

## 2019-09-21 ENCOUNTER — Telehealth: Payer: Self-pay | Admitting: Family Medicine

## 2019-09-21 ENCOUNTER — Other Ambulatory Visit: Payer: Self-pay

## 2019-09-21 ENCOUNTER — Ambulatory Visit (INDEPENDENT_AMBULATORY_CARE_PROVIDER_SITE_OTHER): Payer: 59 | Admitting: Family Medicine

## 2019-09-21 DIAGNOSIS — B0223 Postherpetic polyneuropathy: Secondary | ICD-10-CM | POA: Diagnosis not present

## 2019-09-21 MED ORDER — GABAPENTIN 100 MG PO CAPS: 100.0000 mg | ORAL_CAPSULE | Freq: Three times a day (TID) | ORAL | 1 refills | Status: AC

## 2019-09-21 MED ORDER — VALACYCLOVIR HCL 1 G PO TABS: 1000.0000 mg | ORAL_TABLET | Freq: Three times a day (TID) | ORAL | 0 refills | Status: AC

## 2019-09-21 NOTE — Progress Notes (Signed)
Virtual Visit via Telephone Note  I connected with Michaela Wilson on 09/21/19 at  2:00 PM EDT by telephone and verified that I am speaking with the correct person using two identifiers.   I discussed the limitations, risks, security and privacy concerns of performing an evaluation and management service by telephone and the availability of in person appointments. I also discussed with the patient that there may be a patient responsible charge related to this service. The patient expressed understanding and agreed to proceed.  Provider location: [place]  Patient location: Home  History of Present Illness: Michaela Wilson is a very pleasant 54 year old female that presents via telephone with complaints of left flank and low back burning and tingling that is consistent with previous episode of herpes zoster. Patient says that she was diagnosed with shingles in 2018 and is experiencing similar symptoms. Symptoms have been worsening over the past 48 hours. She characterizes pain as constant, burning, and tingling. She has taking Ibuprofen without sustained relief. She denies fever, chills, abdominal pain, chest pain, or shortness of breath. She endorses "bumps" to left lower back. She attributes flare to increased stressors.     Past Medical History:  Diagnosis Date  . Anemia   . Cellulitis of right buttock 03/07/2015  . Hypertension   . Seasonal allergies   . Vertigo   No Known Allergies Social History   Socioeconomic History  . Marital status: Divorced    Spouse name: Not on file  . Number of children: Not on file  . Years of education: Not on file  . Highest education level: Not on file  Occupational History  . Not on file  Tobacco Use  . Smoking status: Never Smoker  . Smokeless tobacco: Never Used  Vaping Use  . Vaping Use: Never used  Substance and Sexual Activity  . Alcohol use: Yes    Alcohol/week: 0.0 standard drinks    Comment: socially  . Drug use:  No  . Sexual activity: Not Currently  Other Topics Concern  . Not on file  Social History Narrative  . Not on file   Social Determinants of Health   Financial Resource Strain:   . Difficulty of Paying Living Expenses:   Food Insecurity:   . Worried About Charity fundraiser in the Last Year:   . Arboriculturist in the Last Year:   Transportation Needs:   . Film/video editor (Medical):   Marland Kitchen Lack of Transportation (Non-Medical):   Physical Activity:   . Days of Exercise per Week:   . Minutes of Exercise per Session:   Stress:   . Feeling of Stress :   Social Connections:   . Frequency of Communication with Friends and Family:   . Frequency of Social Gatherings with Friends and Family:   . Attends Religious Services:   . Active Member of Clubs or Organizations:   . Attends Archivist Meetings:   Marland Kitchen Marital Status:   Intimate Partner Violence:   . Fear of Current or Ex-Partner:   . Emotionally Abused:   Marland Kitchen Physically Abused:   . Sexually Abused:    Review of Systems  HENT: Negative.   Respiratory: Negative.   Cardiovascular: Negative.   Gastrointestinal: Negative.   Genitourinary: Negative for hematuria.  Musculoskeletal: Positive for back pain (Burning and tingling along left lower back and left flank).  Skin: Positive for rash.  Neurological: Positive for tingling.  Psychiatric/Behavioral: Negative.  Assessment and Plan:  Shingles (herpes zoster) polyneuropathy Patient advised to utilize cool compresses on affected area Cool baths or showers, refrain from bathing in hot water Wear loose clothing Refrain from scratching as rash heals - gabapentin (NEURONTIN) 100 MG capsule; Take 1 capsule (100 mg total) by mouth 3 (three) times daily.  Dispense: 90 capsule; Refill: 1 - valACYclovir (VALTREX) 1000 MG tablet; Take 1 tablet (1,000 mg total) by mouth 3 (three) times daily for 7 days.  Dispense: 20 tablet; Refill: 0 Follow Up Instructions:    I  discussed the assessment and treatment plan with the patient. The patient was provided an opportunity to ask questions and all were answered. The patient agreed with the plan and demonstrated an understanding of the instructions.   The patient was advised to call back or seek an in-person evaluation if the symptoms worsen or if the condition fails to improve as anticipated.  I provided 10 minutes of non-face-to-face time during this encounter.  Donia Pounds  APRN, MSN, FNP-C [facility] [address] [place], [place] [address] [phone]

## 2019-09-21 NOTE — Telephone Encounter (Signed)
Done

## 2019-09-21 NOTE — Patient Instructions (Signed)
Shingles  Shingles is an infection. It gives you a painful skin rash and blisters that have fluid in them. Shingles is caused by the same germ (virus) that causes chickenpox. Shingles only happens in people who:  Have had chickenpox.  Have been given a shot of medicine (vaccine) to protect against chickenpox. Shingles is rare in this group. The first symptoms of shingles may be itching, tingling, or pain in an area on your skin. A rash will show on your skin a few days or weeks later. The rash is likely to be on one side of your body. The rash usually has a shape like a belt or a band. Over time, the rash turns into fluid-filled blisters. The blisters will break open, change into scabs, and dry up. Medicines may:  Help with pain and itching.  Help you get better sooner.  Help to prevent long-term problems. Follow these instructions at home: Medicines  Take over-the-counter and prescription medicines only as told by your doctor.  Put on an anti-itch cream or numbing cream where you have a rash, blisters, or scabs. Do this as told by your doctor. Helping with itching and discomfort   Put cold, wet cloths (cold compresses) on the area of the rash or blisters as told by your doctor.  Cool baths can help you feel better. Try adding baking soda or dry oatmeal to the water to lessen itching. Do not bathe in hot water. Blister and rash care  Keep your rash covered with a loose bandage (dressing).  Wear loose clothing that does not rub on your rash.  Keep your rash and blisters clean. To do this, wash the area with mild soap and cool water as told by your doctor.  Check your rash every day for signs of infection. Check for: ? More redness, swelling, or pain. ? Fluid or blood. ? Warmth. ? Pus or a bad smell.  Do not scratch your rash. Do not pick at your blisters. To help you to not scratch: ? Keep your fingernails clean and cut short. ? Wear gloves or mittens when you sleep, if  scratching is a problem. General instructions  Rest as told by your doctor.  Keep all follow-up visits as told by your doctor. This is important.  Wash your hands often with soap and water. If soap and water are not available, use hand sanitizer. Doing this lowers your chance of getting a skin infection caused by germs (bacteria).  Your infection can cause chickenpox in people who have never had chickenpox or never got a shot of chickenpox vaccine. If you have blisters that did not change into scabs yet, try not to touch other people or be around other people, especially: ? Babies. ? Pregnant women. ? Children who have areas of red, itchy, or rough skin (eczema). ? Very old people who have transplants. ? People who have a long-term (chronic) sickness, like cancer or AIDS. Contact a doctor if:  Your pain does not get better with medicine.  Your pain does not get better after the rash heals.  You have any signs of infection in the rash area. These signs include: ? More redness, swelling, or pain around the rash. ? Fluid or blood coming from the rash. ? The rash area feeling warm to the touch. ? Pus or a bad smell coming from the rash. Get help right away if:  The rash is on your face or nose.  You have pain in your face or pain by   your eye.  You lose feeling on one side of your face.  You have trouble seeing.  You have ear pain, or you have ringing in your ear.  You have a loss of taste.  Your condition gets worse. Summary  Shingles gives you a painful skin rash and blisters that have fluid in them.  Shingles is an infection. It is caused by the same germ (virus) that causes chickenpox.  Keep your rash covered with a loose bandage (dressing). Wear loose clothing that does not rub on your rash.  If you have blisters that did not change into scabs yet, try not to touch other people or be around people. This information is not intended to replace advice given to you by  your health care provider. Make sure you discuss any questions you have with your health care provider. Document Revised: 06/25/2018 Document Reviewed: 11/05/2016 Elsevier Patient Education  2020 Elsevier Inc.  

## 2019-09-28 ENCOUNTER — Ambulatory Visit (INDEPENDENT_AMBULATORY_CARE_PROVIDER_SITE_OTHER): Payer: 59 | Admitting: Psychology

## 2019-09-28 DIAGNOSIS — F4312 Post-traumatic stress disorder, chronic: Secondary | ICD-10-CM

## 2019-10-05 ENCOUNTER — Ambulatory Visit (INDEPENDENT_AMBULATORY_CARE_PROVIDER_SITE_OTHER): Payer: 59 | Admitting: Psychology

## 2019-10-05 DIAGNOSIS — F4312 Post-traumatic stress disorder, chronic: Secondary | ICD-10-CM

## 2019-10-20 ENCOUNTER — Ambulatory Visit (INDEPENDENT_AMBULATORY_CARE_PROVIDER_SITE_OTHER): Payer: 59 | Admitting: Psychology

## 2019-10-20 DIAGNOSIS — F4312 Post-traumatic stress disorder, chronic: Secondary | ICD-10-CM

## 2019-11-02 ENCOUNTER — Ambulatory Visit (INDEPENDENT_AMBULATORY_CARE_PROVIDER_SITE_OTHER): Payer: 59 | Admitting: Psychology

## 2019-11-02 DIAGNOSIS — F4312 Post-traumatic stress disorder, chronic: Secondary | ICD-10-CM | POA: Diagnosis not present

## 2019-11-16 ENCOUNTER — Ambulatory Visit (INDEPENDENT_AMBULATORY_CARE_PROVIDER_SITE_OTHER): Payer: 59 | Admitting: Psychology

## 2019-11-16 DIAGNOSIS — F4312 Post-traumatic stress disorder, chronic: Secondary | ICD-10-CM | POA: Diagnosis not present

## 2019-11-30 ENCOUNTER — Ambulatory Visit (INDEPENDENT_AMBULATORY_CARE_PROVIDER_SITE_OTHER): Payer: 59 | Admitting: Psychology

## 2019-11-30 DIAGNOSIS — F4312 Post-traumatic stress disorder, chronic: Secondary | ICD-10-CM

## 2019-12-13 ENCOUNTER — Ambulatory Visit (INDEPENDENT_AMBULATORY_CARE_PROVIDER_SITE_OTHER): Payer: 59 | Admitting: Psychology

## 2019-12-13 DIAGNOSIS — F4312 Post-traumatic stress disorder, chronic: Secondary | ICD-10-CM

## 2019-12-27 ENCOUNTER — Ambulatory Visit (INDEPENDENT_AMBULATORY_CARE_PROVIDER_SITE_OTHER): Payer: 59 | Admitting: Psychology

## 2019-12-27 DIAGNOSIS — F4312 Post-traumatic stress disorder, chronic: Secondary | ICD-10-CM

## 2020-01-11 ENCOUNTER — Ambulatory Visit (INDEPENDENT_AMBULATORY_CARE_PROVIDER_SITE_OTHER): Payer: 59 | Admitting: Psychology

## 2020-01-11 DIAGNOSIS — F4312 Post-traumatic stress disorder, chronic: Secondary | ICD-10-CM | POA: Diagnosis not present

## 2020-01-23 ENCOUNTER — Ambulatory Visit (INDEPENDENT_AMBULATORY_CARE_PROVIDER_SITE_OTHER): Payer: 59 | Admitting: Psychology

## 2020-01-23 DIAGNOSIS — F4312 Post-traumatic stress disorder, chronic: Secondary | ICD-10-CM | POA: Diagnosis not present

## 2020-02-06 ENCOUNTER — Ambulatory Visit (INDEPENDENT_AMBULATORY_CARE_PROVIDER_SITE_OTHER): Payer: 59 | Admitting: Psychology

## 2020-02-06 DIAGNOSIS — F4312 Post-traumatic stress disorder, chronic: Secondary | ICD-10-CM | POA: Diagnosis not present

## 2021-02-07 IMAGING — DX DG HIP (WITH OR WITHOUT PELVIS) 3-4V BILAT
5 series · 5 of 5 positions shown · non-contrast
Comparison: None.

CLINICAL DATA: 52-year-old female with chronic bilateral knee and
hip pain worse recently specifically to left knee and left hip. Over
compensating for left leg pain putting more weight on right leg
causing right leg to hurt worse. No injury. Initial encounter.

EXAM:
DG HIP (WITH OR WITHOUT PELVIS) 3-4V BILAT

[pelvis ap]
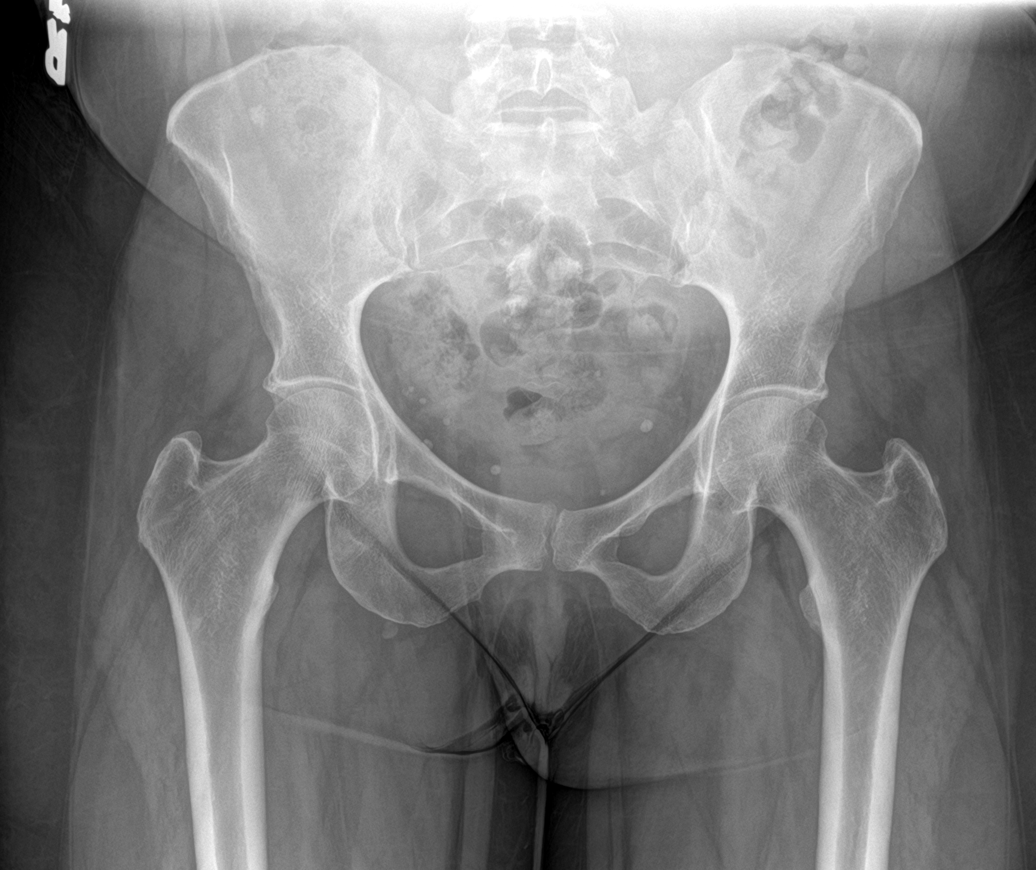

[hip ap (1 of 2)]
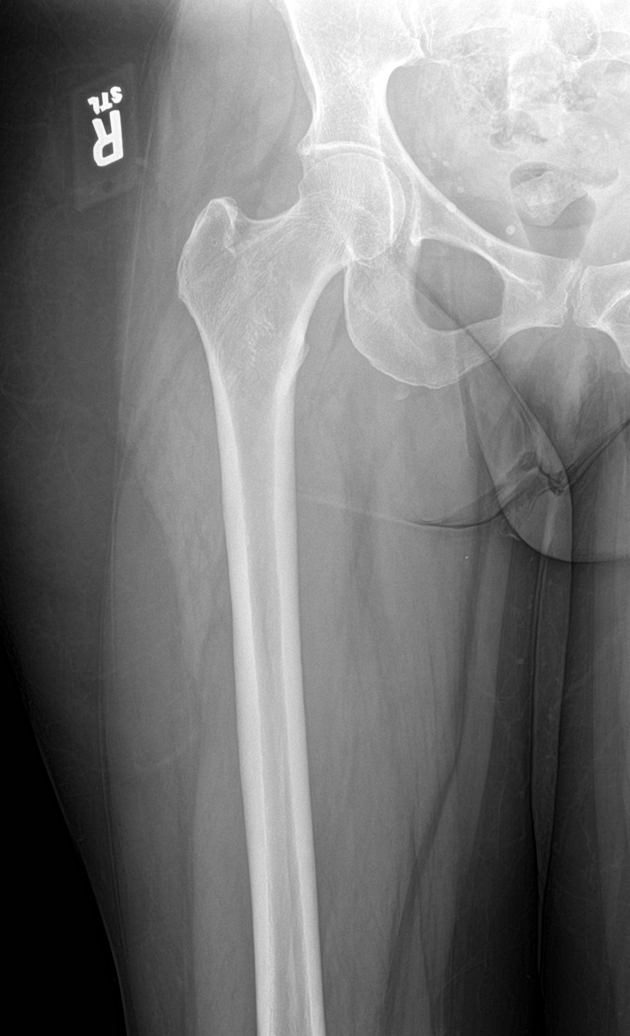

[hip lat (1 of 2)]
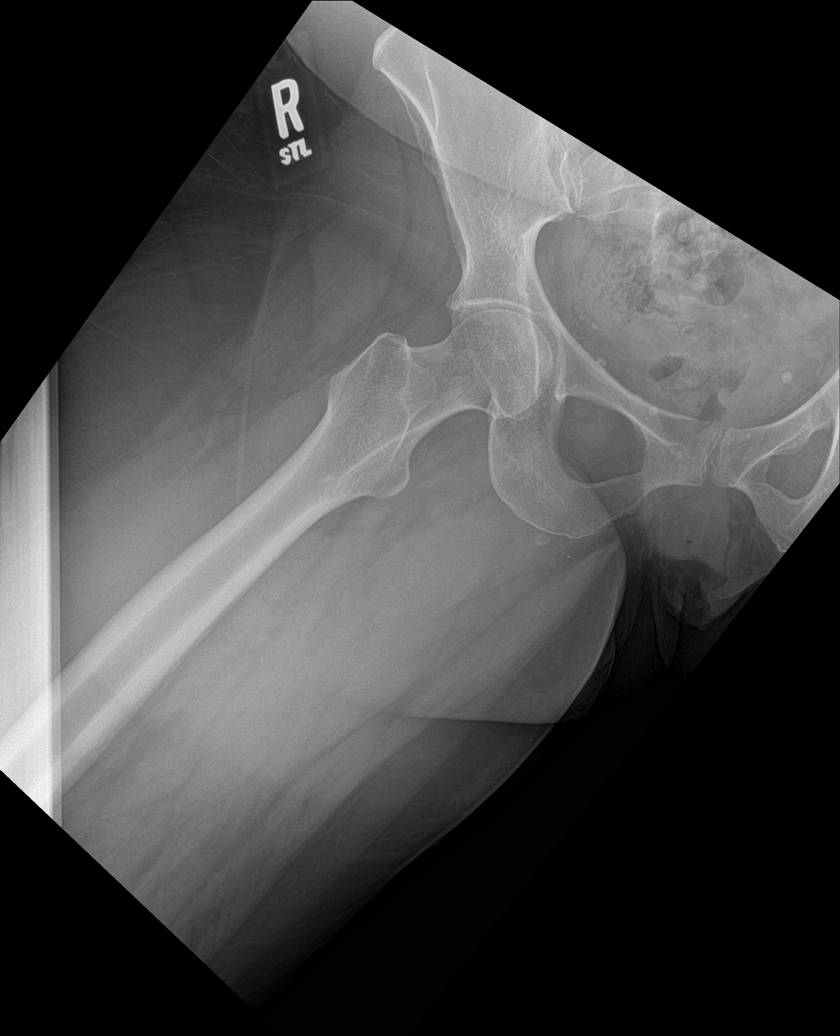

[hip ap (2 of 2)]
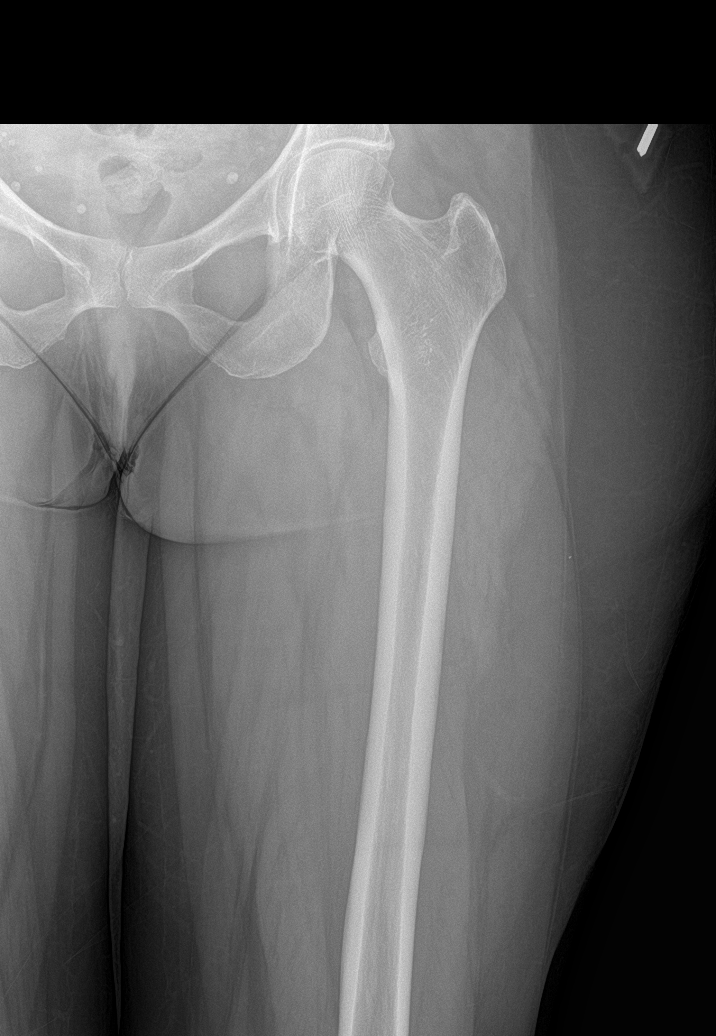

[hip lat (2 of 2)]
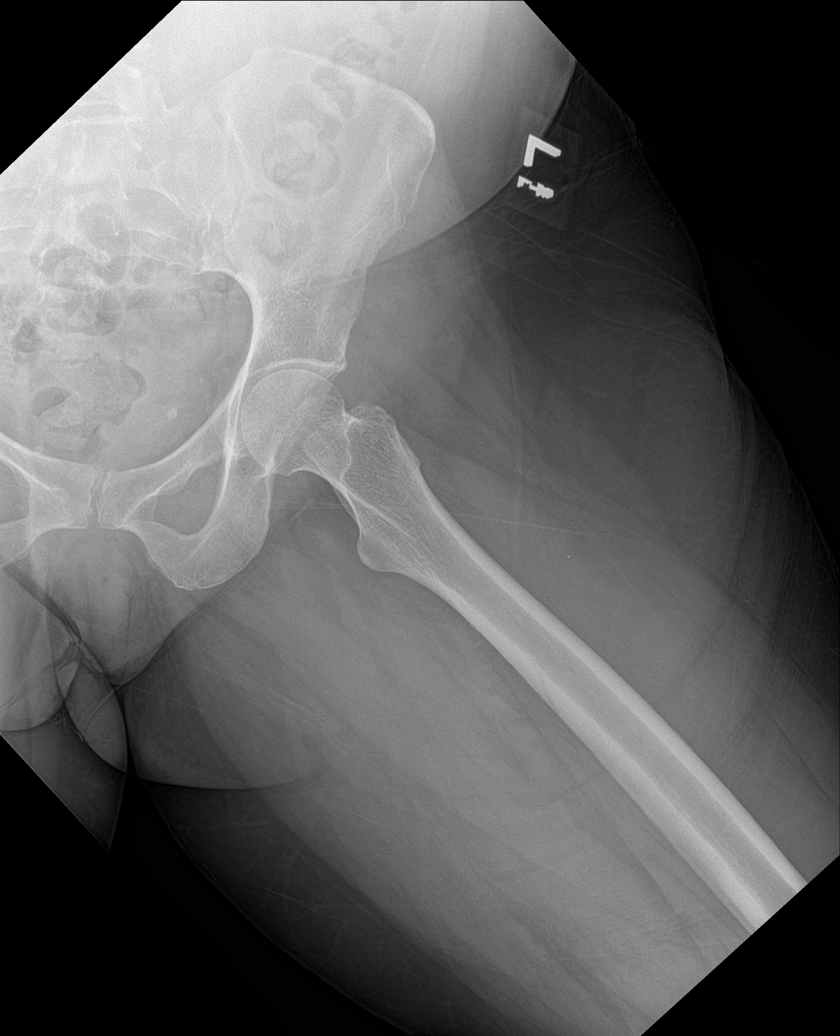

[5 of 5 positions shown; findings below may reference images not displayed]

FINDINGS: Minimal bilateral hip joint degenerative changes. No evidence of
fracture, dislocation or plain film findings to suggest avascular
necrosis.

Mild pubic symphysis degenerative changes. Sacroiliac joints appear
intact.
IMPRESSION: Minimal bilateral hip joint degenerative changes.

## 2021-10-07 ENCOUNTER — Emergency Department (HOSPITAL_COMMUNITY)
Admission: EM | Admit: 2021-10-07 | Discharge: 2021-10-07 | Disposition: A | Discharge status: Home / Self Care | Payer: No Typology Code available for payment source | Attending: Emergency Medicine | Admitting: Emergency Medicine

## 2021-10-07 ENCOUNTER — Emergency Department (HOSPITAL_COMMUNITY): Payer: No Typology Code available for payment source

## 2021-10-07 ENCOUNTER — Other Ambulatory Visit: Payer: Self-pay

## 2021-10-07 ENCOUNTER — Encounter (HOSPITAL_COMMUNITY): Payer: Self-pay

## 2021-10-07 DIAGNOSIS — I10 Essential (primary) hypertension: Secondary | ICD-10-CM | POA: Diagnosis not present

## 2021-10-07 DIAGNOSIS — R519 Headache, unspecified: Secondary | ICD-10-CM | POA: Diagnosis present

## 2021-10-07 LAB — CBC WITH DIFFERENTIAL/PLATELET
Abs Immature Granulocytes: 0.01 10*3/uL (ref 0.00–0.07)
Basophils Absolute: 0 10*3/uL (ref 0.0–0.1)
Basophils Relative: 1 %
Eosinophils Absolute: 0.1 10*3/uL (ref 0.0–0.5)
Eosinophils Relative: 2 %
HCT: 41.2 % (ref 36.0–46.0)
Hemoglobin: 13 g/dL (ref 12.0–15.0)
Immature Granulocytes: 0 %
Lymphocytes Relative: 40 %
Lymphs Abs: 2.3 10*3/uL (ref 0.7–4.0)
MCH: 27.5 pg (ref 26.0–34.0)
MCHC: 31.6 g/dL (ref 30.0–36.0)
MCV: 87.3 fL (ref 80.0–100.0)
Monocytes Absolute: 0.6 10*3/uL (ref 0.1–1.0)
Monocytes Relative: 10 %
Neutro Abs: 2.7 10*3/uL (ref 1.7–7.7)
Neutrophils Relative %: 47 %
Platelets: 263 10*3/uL (ref 150–400)
RBC: 4.72 MIL/uL (ref 3.87–5.11)
RDW: 13.1 % (ref 11.5–15.5)
WBC: 5.7 10*3/uL (ref 4.0–10.5)
nRBC: 0 % (ref 0.0–0.2)

## 2021-10-07 LAB — URINALYSIS, ROUTINE W REFLEX MICROSCOPIC
Bacteria, UA: NONE SEEN
Bilirubin Urine: NEGATIVE
Glucose, UA: NEGATIVE mg/dL
Hgb urine dipstick: NEGATIVE
Ketones, ur: NEGATIVE mg/dL
Nitrite: NEGATIVE
Protein, ur: NEGATIVE mg/dL
Specific Gravity, Urine: 1.027 (ref 1.005–1.030)
pH: 5 (ref 5.0–8.0)

## 2021-10-07 LAB — BASIC METABOLIC PANEL
Anion gap: 6 (ref 5–15)
BUN: 15 mg/dL (ref 6–20)
CO2: 25 mmol/L (ref 22–32)
Calcium: 9.4 mg/dL (ref 8.9–10.3)
Chloride: 109 mmol/L (ref 98–111)
Creatinine, Ser: 0.93 mg/dL (ref 0.44–1.00)
GFR, Estimated: >60 mL/min (ref >60)
Glucose, Bld: 96 mg/dL (ref 70–99)
Potassium: 3.8 mmol/L (ref 3.5–5.1)
Sodium: 140 mmol/L (ref 135–145)

## 2021-10-07 LAB — TROPONIN I (HIGH SENSITIVITY): Troponin I (High Sensitivity): <2 ng/L (ref <18)

## 2021-10-07 NOTE — ED Notes (Signed)
I provided reinforced discharge education based off of discharge summary/care provided. Pt acknowledged and understood my education. Pt had no further questions/concerns for provider/myself.   

## 2021-10-07 NOTE — Discharge Instructions (Signed)
Your labs and imaging were all normal today. Please follow up with your doctor at the [facility] for medication changes to control your BP

## 2021-10-07 NOTE — ED Triage Notes (Signed)
Pt reports intermittent HTN, dizziness, blurred vision, and headache over the past few days. Pt reports starting BP meds back in June.

## 2021-10-07 NOTE — ED Provider Triage Note (Signed)
Emergency Medicine Provider Triage Evaluation Note  Michaela Wilson , a 56 y.o. female  was evaluated in triage.  Pt complains of hypertension causing headache, dizziness, blurred vision and chest pain.  Patient states that she goes to the [facility] and they started her on losartan 25 mg 1 month ago.  This does not appear to be controlling her blood pressure very well and she believes that her blood pressure appears to be even worse than before she was on the medication.  She called the [facility] today who sent her to the emergency department based on her symptoms.  Patient also complains of some shortness of breath and intermittently has swelling of the hands and the bilateral lower extremities.  Currently does not have swelling..  Review of Systems  Positive: Chest pain, shortness of breath, headache, dizziness, blurred vision Negative: Abdominal pain, nausea, vomiting and diarrhea  Physical Exam  BP (!) 170/101 (BP Location: Left Arm)   Pulse 70   Temp 98.5 F (36.9 C) (Oral)   Resp 18   Ht '5\' 2"'$  (1.575 m)   Wt 69.4 kg   SpO2 100%   BMI 27.98 kg/m  Gen:   Awake, no distress   Resp:  Normal effort  MSK:   Moves extremities without difficulty  Other:    Medical Decision Making  Medically screening exam initiated at 4:25 PM.  Appropriate orders placed.  [name] was informed that the remainder of the evaluation will be completed by another provider, this initial triage assessment does not replace that evaluation, and the importance of remaining in the ED until their evaluation is complete.     Tonye Pearson, [place] 10/07/21 1626

## 2021-10-07 NOTE — ED Provider Notes (Signed)
[facility] DEPT Provider Note   CSN: [id number] Arrival date & time: 10/07/21  1549     History  Chief Complaint  Patient presents with   Hypertension    Michaela Wilson is a 56 y.o. female who presents to the emergency department for evaluation of elevated blood pressure with symptoms of headache, dizziness, blurred vision, intermittent chest pain described as tightness.  The last time she felt chest tightness was earlier this morning.  Patient states that she was started on blood pressure medications 1 month ago, losartan 25 mg, and if this does not appear to be controlling her blood pressure very well.  Symptoms started over the last few days and been coming and going.  She called the [facility] today who prescribed her medications, they told her to come to the emergency department for evaluation.  Patient had labs and imaging ordered in triage by myself, and then I personally evaluated patient when she was brought to room in the back, and she reports feeling much better with resolution of symptoms.  Her blood pressure had come down from 170/101 to 158/96.  She was standing at a table eating [employer] when I came into the exam room.   Hypertension Associated symptoms include headaches. Pertinent negatives include no shortness of breath.       Home Medications Prior to Admission medications   Medication Sig Start Date End Date Taking? Authorizing Provider  gabapentin (NEURONTIN) 100 MG capsule Take 1 capsule (100 mg total) by mouth 3 (three) times daily. 09/21/19   Dorena Dew, FNP  meclizine (ANTIVERT) 25 MG tablet Take 25 mg by mouth 3 (three) times daily as needed for dizziness.    [provider]  omeprazole (PRILOSEC) 20 MG capsule Take 1 capsule (20 mg total) by mouth daily. 03/29/19   Dorena Dew, FNP  polycarbophil (FIBERCON) 625 MG tablet Take 625 mg by mouth daily.    [provider]  sertraline (ZOLOFT) 25 MG tablet Take  1.5 tablets (37.5 mg total) by mouth daily. 11/25/18   Lanae Boast, FNP  Vitamin D, Ergocalciferol, (DRISDOL) 1.25 MG (50000 UNIT) CAPS capsule Take 50,000 Units by mouth every 7 (seven) days.    [provider]      Allergies    Patient has no known allergies.    Review of Systems   Review of Systems  Constitutional:  Negative for fever.  Eyes:  Positive for visual disturbance.  Respiratory:  Negative for shortness of breath.   Gastrointestinal:  Negative for nausea and vomiting.  Neurological:  Positive for light-headedness and headaches.    Physical Exam Updated Vital Signs BP (!) 158/96 (BP Location: Left Arm)   Pulse 76   Temp 98.5 F (36.9 C) (Oral)   Resp 18   Ht '5\' 2"'$  (1.575 m)   Wt 69.4 kg   SpO2 99%   BMI 27.98 kg/m  Physical Exam Vitals and nursing note reviewed.  Constitutional:      General: She is not in acute distress.    Appearance: She is not ill-appearing.  HENT:     Head: Atraumatic.  Eyes:     Conjunctiva/sclera: Conjunctivae normal.     Visual Fields: Right eye visual fields normal and left eye visual fields normal.  Cardiovascular:     Rate and Rhythm: Normal rate and regular rhythm.     Pulses: Normal pulses.     Heart sounds: No murmur heard. Pulmonary:     Effort: Pulmonary  effort is normal. No respiratory distress.     Breath sounds: Normal breath sounds.  Abdominal:     General: Abdomen is flat. There is no distension.     Palpations: Abdomen is soft.     Tenderness: There is no abdominal tenderness.  Musculoskeletal:        General: Normal range of motion.     Cervical back: Normal range of motion.  Skin:    General: Skin is warm and dry.     Capillary Refill: Capillary refill takes less than 2 seconds.  Neurological:     General: No focal deficit present.     Mental Status: She is alert.     Comments: Speech is clear, able to follow commands CN III-XII intact Normal strength in upper and lower extremities bilaterally  including dorsiflexion and plantar flexion, strong and equal grip strength Subjective sensation intact Moves extremities without ataxia, coordination intact Normal finger to nose and rapid alternating movements No pronator drift     Psychiatric:        Mood and Affect: Mood normal.     ED Results / Procedures / Treatments   Labs (all labs ordered are listed, but only abnormal results are displayed) Labs Reviewed  BASIC METABOLIC PANEL  CBC WITH DIFFERENTIAL/PLATELET  URINALYSIS, ROUTINE W REFLEX MICROSCOPIC  TROPONIN I (HIGH SENSITIVITY)  TROPONIN I (HIGH SENSITIVITY)    EKG None  Radiology CT Head Wo Contrast  Result Date: 10/07/2021 CLINICAL DATA:  Headache, chronic with new features. EXAM: CT HEAD WITHOUT CONTRAST TECHNIQUE: Contiguous axial images were obtained from the base of the skull through the vertex without intravenous contrast. RADIATION DOSE REDUCTION: This exam was performed according to the departmental dose-optimization program which includes automated exposure control, adjustment of the mA and/or kV according to patient size and/or use of iterative reconstruction technique. COMPARISON:  None FINDINGS: Brain: No evidence of acute infarction, hemorrhage, hydrocephalus, extra-axial collection or mass lesion/mass effect. Vascular: No hyperdense vessel or unexpected calcification. Skull: Normal. Negative for fracture or focal lesion. Sinuses/Orbits: No acute finding. Other: None. IMPRESSION: No acute intracranial abnormality. Electronically Signed   By: Keane Police D.O.   On: 10/07/2021 17:21   DG Chest 2 View  Result Date: 10/07/2021 CLINICAL DATA:  Shortness of breath, dizziness EXAM: CHEST - 2 VIEW COMPARISON:  None Available. FINDINGS: Cardiac size is within normal limits. Lung fields are clear of any infiltrate or pulmonary edema. There is no pleural effusion or pneumothorax. Faint radiopacity overlying the left upper lung field most likely is a cardiac monitoring  lead. Surgical clips are seen in right upper quadrant of abdomen. IMPRESSION: No active cardiopulmonary disease. Electronically Signed   By: Elmer Picker M.D.   On: 10/07/2021 17:04    Procedures Procedures    Medications Ordered in ED Medications - No data to display  ED Course/ Medical Decision Making/ A&P                           Medical Decision Making Amount and/or Complexity of Data Reviewed Labs: ordered. Radiology: ordered.   Social determinants of health:  Social History   Socioeconomic History   Marital status: Divorced    Spouse name: Not on file   Number of children: Not on file   Years of education: Not on file   Highest education level: Not on file  Occupational History   Not on file  Tobacco Use   Smoking status: Never  Smokeless tobacco: Never  Vaping Use   Vaping Use: Never used  Substance and Sexual Activity   Alcohol use: Yes    Alcohol/week: 0.0 standard drinks of alcohol    Comment: socially   Drug use: No   Sexual activity: Not Currently  Other Topics Concern   Not on file  Social History Narrative   Not on file   Social Determinants of Health   Financial Resource Strain: Not on file  Food Insecurity: Not on file  Transportation Needs: Not on file  Physical Activity: Not on file  Stress: Not on file  Social Connections: Not on file  Intimate Partner Violence: Not on file     Initial impression:  This patient presents to the ED for concern of symptomatic hypertension, this involves an extensive number of treatment options, and is a complaint that carries with it a high risk of complications and morbidity.   Differentials include stroke, hypertensive urgency, hypertensive emergency, ACS.   Comorbidities affecting care:  Hypertension  Additional history obtained: RN notes reviewed  Lab Tests  I Ordered, reviewed, and interpreted labs and EKG.  The pertinent results include:  BMP, CBC and troponin normal  Imaging  Studies ordered:  I ordered imaging studies including  CT head normal Chest x-ray normal  I independently visualized and interpreted imaging and I agree with the radiologist interpretation.   EKG: Sinus rhythm   ED Course/Re-evaluation: Patient presents in no acute distress and is nontoxic-appearing.  Vitals notable for blood pressure of 158/96, improved from triage 2.5 hours prior where it was 170/101.  Patient is standing, eating [employer] upon entering the exam room.  She reports that her symptoms have resolved and she is feeling much better.  Labs and imaging were overall reassuring.  Given her reassuring work-up and symptomatic improvement, patient is stable for discharge and she can follow-up with her doctor at the [facility] to discuss medication changes needed to better control her blood pressure.  Return precautions were discussed.  Patient expresses understanding is amenable to plan.  Disposition:  After consideration of the diagnostic results, physical exam, history and the patients response to treatment feel that the patent would benefit from discharge.   Primary hypertension: Plan and management as described above. Discharged home in good condition.  Final Clinical Impression(s) / ED Diagnoses Final diagnoses:  Primary hypertension    Rx / DC Orders ED Discharge Orders     None         Rodena Piety 10/07/21 Laurell Roof, MD 10/07/21 2255

## 2022-01-10 IMAGING — DX DG ABDOMEN 1V
2 series · 2 of 2 positions shown · non-contrast
Comparison: None.

CLINICAL DATA: 53-year-old with area of bowel syndrome presenting
with acute upon chronic abdominal discomfort and intermittent
diarrhea and constipation.

EXAM:
ABDOMEN - 1 VIEW

[abdomen kub (1 of 2)]
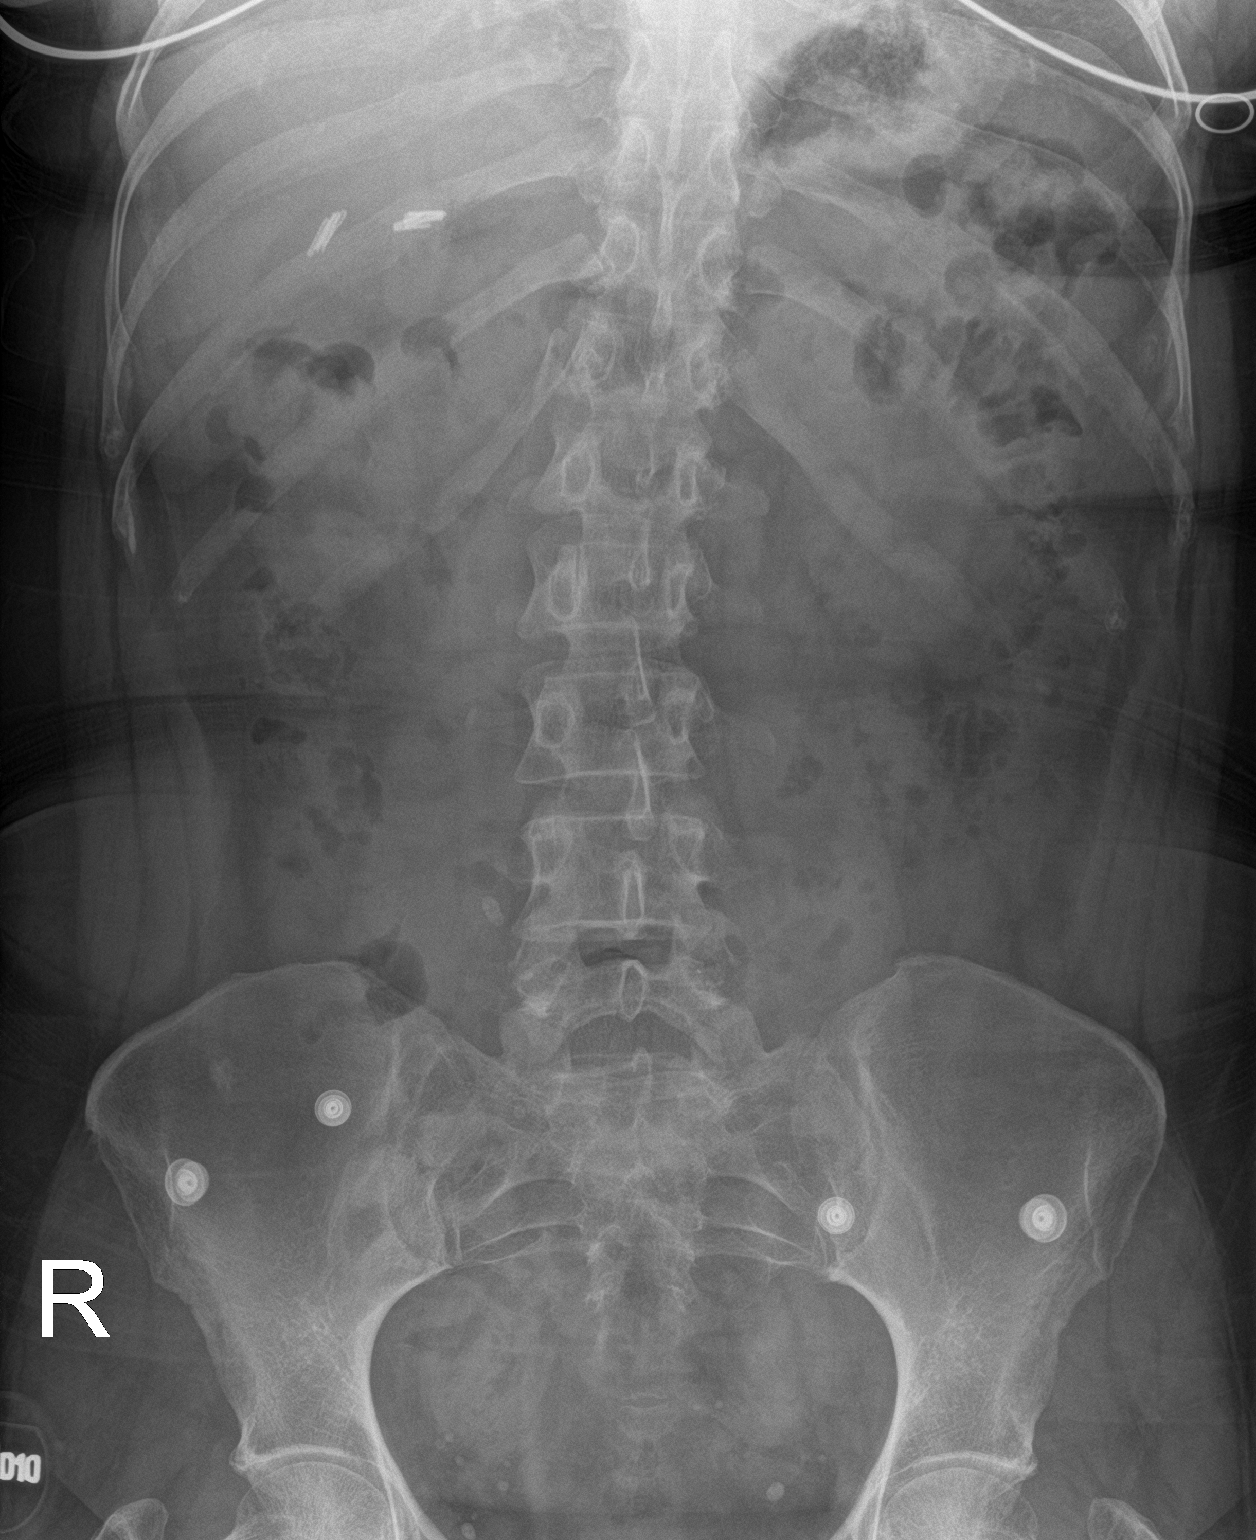

[abdomen kub (2 of 2)]
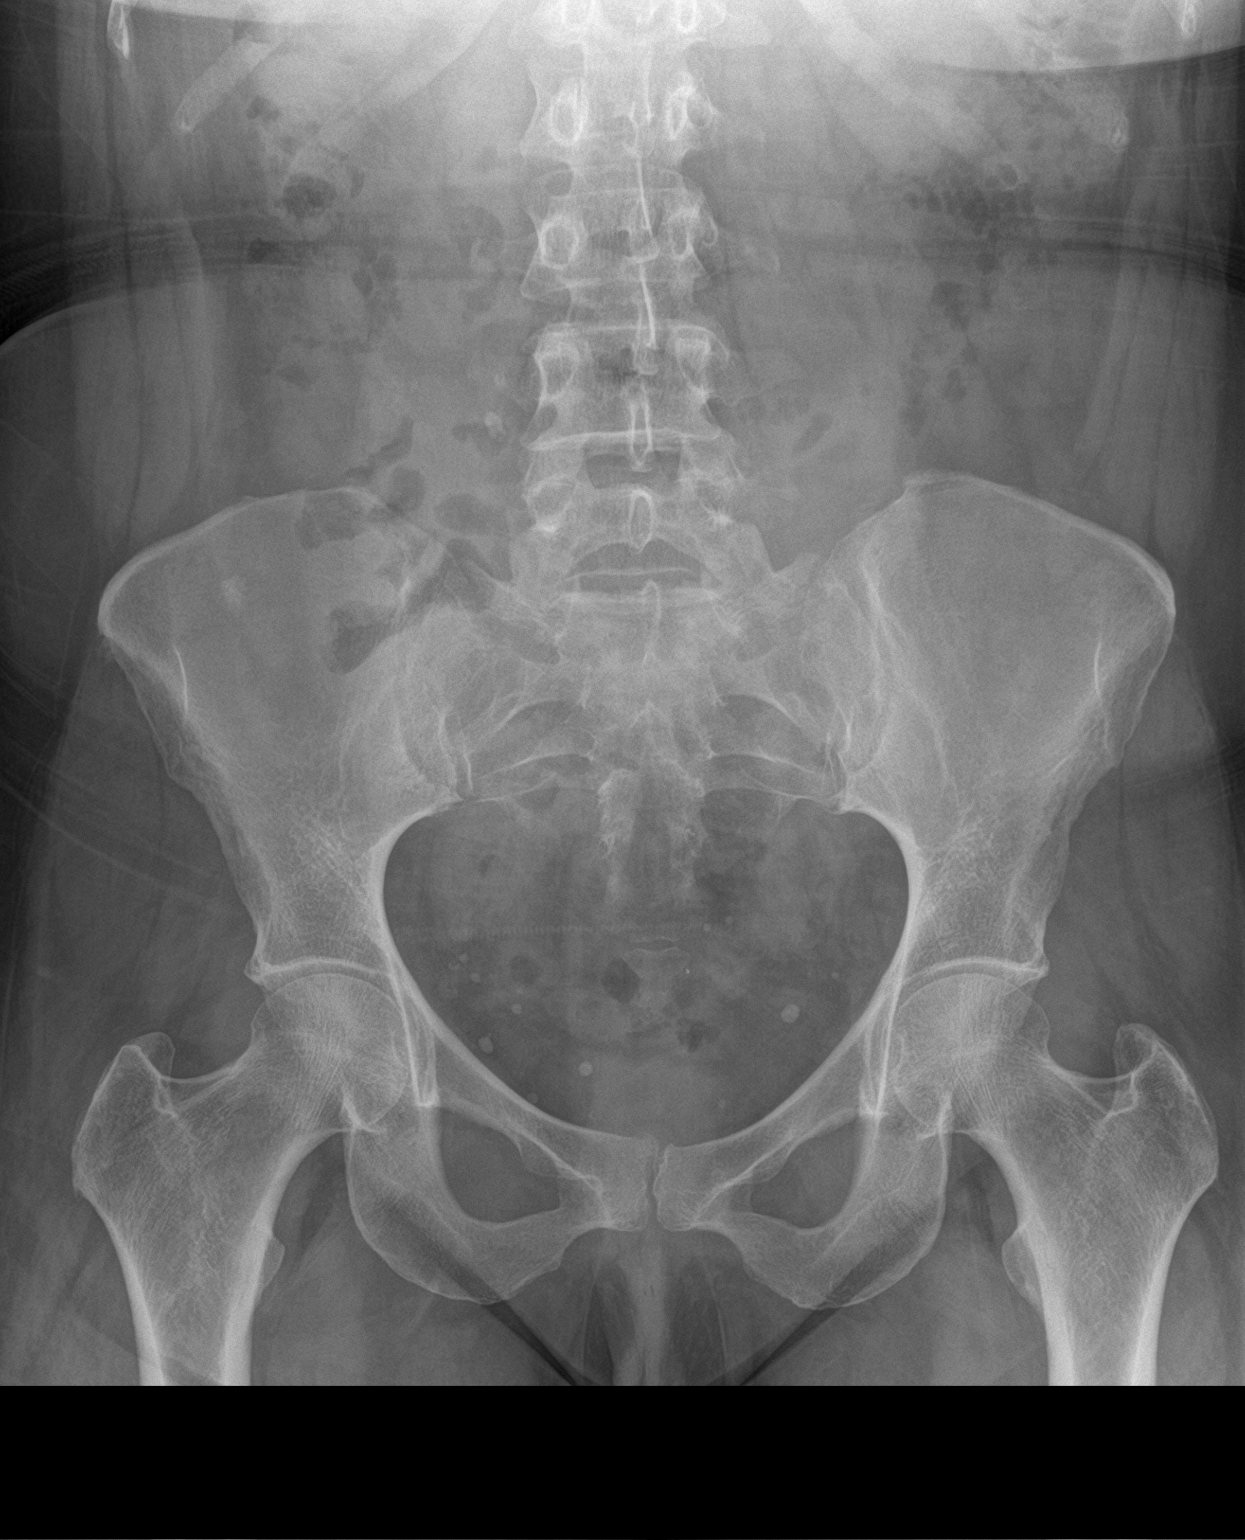

[2 of 2 positions shown; findings below may reference images not displayed]

FINDINGS: Bowel gas pattern unremarkable without evidence of obstruction or
significant ileus. Expected stool burden in the colon. No opaque
urinary tract calculi. Phleboliths low in both sides of the pelvis
and phlebolith just LATERAL to the L4 vertebral body on the RIGHT,
likely in the ovarian vein. Surgical clips in the RIGHT UPPER
QUADRANT from prior cholecystectomy. Regional skeleton unremarkable.
IMPRESSION: No acute abdominal abnormality.
